# Patient Record
Sex: Female | Born: 1950 | Race: Black or African American | Hispanic: No | Smoking: Former smoker
Health system: Southern US, Community
[De-identification: ages and names within clinical notes are randomized; demographics above are authoritative.]

## PROBLEM LIST (undated history)

## (undated) DIAGNOSIS — C50912 Malignant neoplasm of unspecified site of left female breast: Secondary | ICD-10-CM

## (undated) DIAGNOSIS — E119 Type 2 diabetes mellitus without complications: Secondary | ICD-10-CM

## (undated) DIAGNOSIS — J189 Pneumonia, unspecified organism: Secondary | ICD-10-CM

## (undated) DIAGNOSIS — I10 Essential (primary) hypertension: Secondary | ICD-10-CM

## (undated) DIAGNOSIS — Z9221 Personal history of antineoplastic chemotherapy: Secondary | ICD-10-CM

## (undated) DIAGNOSIS — E039 Hypothyroidism, unspecified: Secondary | ICD-10-CM

## (undated) DIAGNOSIS — R51 Headache: Secondary | ICD-10-CM

## (undated) DIAGNOSIS — D649 Anemia, unspecified: Secondary | ICD-10-CM

## (undated) DIAGNOSIS — R519 Headache, unspecified: Secondary | ICD-10-CM

## (undated) DIAGNOSIS — G709 Myoneural disorder, unspecified: Secondary | ICD-10-CM

## (undated) DIAGNOSIS — M549 Dorsalgia, unspecified: Secondary | ICD-10-CM

## (undated) DIAGNOSIS — Z923 Personal history of irradiation: Secondary | ICD-10-CM

## (undated) DIAGNOSIS — E785 Hyperlipidemia, unspecified: Secondary | ICD-10-CM

## (undated) DIAGNOSIS — I639 Cerebral infarction, unspecified: Secondary | ICD-10-CM

## (undated) DIAGNOSIS — G8929 Other chronic pain: Secondary | ICD-10-CM

## (undated) DIAGNOSIS — R011 Cardiac murmur, unspecified: Secondary | ICD-10-CM

## (undated) DIAGNOSIS — M1990 Unspecified osteoarthritis, unspecified site: Secondary | ICD-10-CM

## (undated) HISTORY — PX: ABDOMINAL HYSTERECTOMY: SHX81

## (undated) HISTORY — DX: Headache: R51

## (undated) HISTORY — PX: TONSILLECTOMY: SUR1361

## (undated) HISTORY — PX: CARPAL TUNNEL RELEASE: SHX101

## (undated) HISTORY — PX: APPENDECTOMY: SHX54

## (undated) HISTORY — DX: Headache, unspecified: R51.9

## (undated) HISTORY — PX: BUNIONECTOMY: SHX129

## (undated) HISTORY — PX: BILATERAL OOPHORECTOMY: SHX1221

## (undated) HISTORY — PX: REDUCTION MAMMAPLASTY: SUR839

---

## 2006-05-24 HISTORY — PX: MASTECTOMY: SHX3

## 2006-05-24 HISTORY — PX: BREAST BIOPSY: SHX20

## 2011-02-16 HISTORY — PX: CARDIAC CATHETERIZATION: SHX172

## 2014-02-05 DIAGNOSIS — E119 Type 2 diabetes mellitus without complications: Secondary | ICD-10-CM | POA: Insufficient documentation

## 2014-02-05 DIAGNOSIS — E041 Nontoxic single thyroid nodule: Secondary | ICD-10-CM | POA: Insufficient documentation

## 2014-02-05 DIAGNOSIS — Z853 Personal history of malignant neoplasm of breast: Secondary | ICD-10-CM | POA: Insufficient documentation

## 2014-02-05 DIAGNOSIS — I10 Essential (primary) hypertension: Secondary | ICD-10-CM | POA: Insufficient documentation

## 2014-02-25 ENCOUNTER — Telehealth: Payer: Self-pay | Admitting: *Deleted

## 2014-02-25 NOTE — Telephone Encounter (Signed)
Received paperwork to [facility].  Called pt and confirmed 03/22/14 appt w/ her.  Mailed calendar, welcoming packet & intake form to pt.  Made copy of records and placed one in Dr. Geralyn Flash box and took the other one to [facility] to scan.

## 2014-03-22 ENCOUNTER — Ambulatory Visit (HOSPITAL_BASED_OUTPATIENT_CLINIC_OR_DEPARTMENT_OTHER): Payer: Medicare Other | Admitting: Hematology and Oncology

## 2014-03-22 ENCOUNTER — Other Ambulatory Visit (INDEPENDENT_AMBULATORY_CARE_PROVIDER_SITE_OTHER): Payer: Self-pay

## 2014-03-22 ENCOUNTER — Ambulatory Visit (HOSPITAL_BASED_OUTPATIENT_CLINIC_OR_DEPARTMENT_OTHER): Payer: Medicare Other

## 2014-03-22 ENCOUNTER — Ambulatory Visit: Payer: Medicare Other

## 2014-03-22 ENCOUNTER — Telehealth: Payer: Self-pay | Admitting: Hematology and Oncology

## 2014-03-22 ENCOUNTER — Ambulatory Visit: Payer: BC Managed Care – PPO

## 2014-03-22 VITALS — BP 147/72 | HR 71 | Temp 98.4°F | Resp 18 | Ht 63.0 in | Wt 181.4 lb

## 2014-03-22 DIAGNOSIS — D630 Anemia in neoplastic disease: Secondary | ICD-10-CM

## 2014-03-22 DIAGNOSIS — C50412 Malignant neoplasm of upper-outer quadrant of left female breast: Secondary | ICD-10-CM

## 2014-03-22 DIAGNOSIS — Z170 Estrogen receptor positive status [ER+]: Secondary | ICD-10-CM

## 2014-03-22 DIAGNOSIS — E042 Nontoxic multinodular goiter: Secondary | ICD-10-CM

## 2014-03-22 LAB — CBC & DIFF AND RETIC
BASO%: 0.2 % (ref 0.0–2.0)
BASOS ABS: 0 10*3/uL (ref 0.0–0.1)
EOS%: 1.6 % (ref 0.0–7.0)
Eosinophils Absolute: 0.1 10*3/uL (ref 0.0–0.5)
HCT: 36.9 % (ref 34.8–46.6)
HEMOGLOBIN: 12 g/dL (ref 11.6–15.9)
Immature Retic Fract: 7.2 % (ref 1.60–10.00)
LYMPH#: 1.9 10*3/uL (ref 0.9–3.3)
LYMPH%: 43.4 % (ref 14.0–49.7)
MCH: 26.5 pg (ref 25.1–34.0)
MCHC: 32.5 g/dL (ref 31.5–36.0)
MCV: 81.5 fL (ref 79.5–101.0)
MONO#: 0.3 10*3/uL (ref 0.1–0.9)
MONO%: 7.1 % (ref 0.0–14.0)
NEUT%: 47.7 % (ref 38.4–76.8)
NEUTROS ABS: 2.1 10*3/uL (ref 1.5–6.5)
NRBC: 0 % (ref 0–0)
Platelets: 186 10*3/uL (ref 145–400)
RBC: 4.53 10*6/uL (ref 3.70–5.45)
RDW: 16.1 % — AB (ref 11.2–14.5)
RETIC %: 1.21 % (ref 0.70–2.10)
RETIC CT ABS: 54.81 10*3/uL (ref 33.70–90.70)
WBC: 4.4 10*3/uL (ref 3.9–10.3)

## 2014-03-22 LAB — IRON AND TIBC CHCC
%SAT: 20 % — AB (ref 21–57)
IRON: 70 ug/dL (ref 41–142)
TIBC: 355 ug/dL (ref 236–444)
UIBC: 285 ug/dL (ref 120–384)

## 2014-03-22 LAB — COMPREHENSIVE METABOLIC PANEL (CC13)
ALT: 20 U/L (ref 0–55)
ANION GAP: 9 meq/L (ref 3–11)
AST: 18 U/L (ref 5–34)
Albumin: 4.1 g/dL (ref 3.5–5.0)
Alkaline Phosphatase: 58 U/L (ref 40–150)
BUN: 19.6 mg/dL (ref 7.0–26.0)
CALCIUM: 10.3 mg/dL (ref 8.4–10.4)
CO2: 29 meq/L (ref 22–29)
CREATININE: 1.1 mg/dL (ref 0.6–1.1)
Chloride: 105 mEq/L (ref 98–109)
Glucose: 89 mg/dl (ref 70–140)
POTASSIUM: 4 meq/L (ref 3.5–5.1)
Sodium: 143 mEq/L (ref 136–145)
Total Bilirubin: 1.1 mg/dL (ref 0.20–1.20)
Total Protein: 7 g/dL (ref 6.4–8.3)

## 2014-03-22 LAB — FERRITIN CHCC: Ferritin: 9 ng/ml (ref 9–269)

## 2014-03-22 NOTE — Assessment & Plan Note (Signed)
Normocytic anemia: We will obtain another CBC with reticulocyte count, erythropoietin, iron studies O17 and folic acid for further evaluation.

## 2014-03-22 NOTE — Telephone Encounter (Signed)
gv adn printed appt sched and avs for pt for July 2016...sent to lab

## 2014-03-22 NOTE — Assessment & Plan Note (Signed)
Left breast cancer status post left mastectomy December 2000 and T2, N1, M0 stage IIB ER/PR positive HER-2 negative followed by adjuvant chemotherapy and radiation started Arimidex July 2009 and completed today 03/22/2014  I discussed with her that there is no current data to support antiestrogen therapy with aromatase inhibitors for more than 5 years. If she wanted to continue I be happy to continue this for later on that there are risks to treatment. The patient was not interested in continuing and would like to stop it at this point. Surveillance: She will have a mammogram in June 2015 I will see her after that for physical exam and followup.  Back pain: I suspect this is musculoskeletal in nature, because it has been persistent for the past 2 years, ectopy and a bone scan.

## 2014-03-22 NOTE — Progress Notes (Signed)
[facility] CONSULT NOTE  Patient has no care team.  CHIEF COMPLAINTS/PURPOSE OF CONSULTATION:  To establish breast cancer care  HISTORY OF PRESENTING ILLNESS:  Jeanette [name] 63 y.o. female is here because of diagnosis of left breast cancer was diagnosed in 2008. She underwent left mastectomy and apparently showed a T2, N1, M0 stage IIB ER/PR positive HER-2 negative breast cancer. We do not have any records to confirm this this is based on the patient's information can she subsequently underwent chemotherapy with Taxotere and Cytoxan x4 cycles. After that she underwent adjuvant radiation therapy and she has not been on antiestrogen therapy with Arimidex since July 2009. She was being treated previously at an outside facility and oncology's recommendation was to continue antiestrogen therapy beyond 5 years she wishes to establish oncology care with [place]. Patient really wishes to stop Arimidex therapy.  I reviewed her records extensively and collaborated the history with the patient.  SUMMARY OF ONCOLOGIC HISTORY:   Breast cancer of upper-outer quadrant of left female breast   03/23/2007 Initial Diagnosis Breast cancer of upper-outer quadrant of left female breast   05/09/2007 Surgery Left mastectomy T2, N1, M0 stage IIB ER/PR positive HER-2 negative (based on patient's description.- no records available)   06/13/2007 - 08/02/2007 Chemotherapy Taxotere Cytoxan x4   09/21/2007 - 11/01/2007 Radiation Therapy Adjuvant radiation therapy   11/28/2007 - 03/22/2014 Anti-estrogen oral therapy Arimidex 1 mg daily   MEDICAL HISTORY:  Hypercholesterolemia, breast cancer, chronic back pain, degenerative disc disease SURGICAL HISTORY: Tonsillectomy, hysterectomy, bunionectomy, left mastectomy, oophorectomy, carpal tunnel surgery, breast reduction, appendectomy SOCIAL HISTORY: Divorced never smoker never drinker denies recreational drug use FAMILY HISTORY: No family history of breast cancer or any  other cancers ALLERGIES:  has No Known Allergies.  MEDICATIONS:  Current Outpatient Prescriptions  Medication Sig Dispense Refill  . anastrozole (ARIMIDEX) 1 MG tablet Take 1 mg by mouth.      Marland Kitchen aspirin 325 MG tablet Take by mouth.      Marland Kitchen atorvastatin (LIPITOR) 20 MG tablet Take by mouth.      . chlorpheniramine-HYDROcodone (TUSSIONEX) 10-8 MG/5ML LQCR Take by mouth.      . hydrochlorothiazide (HYDRODIURIL) 25 MG tablet Take by mouth.      . losartan (COZAAR) 100 MG tablet Take by mouth.      . metFORMIN (GLUCOPHAGE) 1000 MG tablet Take by mouth.      . metoprolol tartrate (LOPRESSOR) 25 MG tablet Take by mouth.      . Multiple Vitamins-Minerals (MULTIVITAMIN WITH MINERALS) tablet Take by mouth.      . naproxen (NAPROSYN) 500 MG tablet Take by mouth.       No current facility-administered medications for this visit.    REVIEW OF SYSTEMS:   Constitutional: Denies fevers, chills or abnormal night sweats Eyes: Denies blurriness of vision, double vision or watery eyes Ears, nose, mouth, throat, and face: Denies mucositis or sore throat Respiratory: Denies cough, dyspnea or wheezes Cardiovascular: Denies palpitation, chest discomfort or lower extremity swelling Gastrointestinal:  Denies nausea, heartburn or change in bowel habits Skin: Denies abnormal skin rashes Lymphatics: Denies new lymphadenopathy or easy bruising Neurological:Denies numbness, tingling or new weaknesses Behavioral/Psych: Mood is stable, no new changes  Breast:  Denies any palpable lumps or discharge All other systems were reviewed with the patient and are negative.  PHYSICAL EXAMINATION: ECOG PERFORMANCE STATUS: 0 - Asymptomatic  Filed Vitals:   03/22/14 1039  BP: 147/72  Pulse: 71  Temp: 98.4 F (36.9 C)  Resp: 18   Filed Weights   03/22/14 1039  Weight: 181 lb 6.4 oz (82.283 kg)    GENERAL:alert, no distress and comfortable SKIN: skin color, texture, turgor are normal, no rashes or significant  lesions EYES: normal, conjunctiva are pink and non-injected, sclera clear OROPHARYNX:no exudate, no erythema and lips, buccal mucosa, and tongue normal  NECK: supple, thyroid normal size, non-tender, without nodularity LYMPH:  no palpable lymphadenopathy in the cervical, axillary or inguinal LUNGS: clear to auscultation and percussion with normal breathing effort HEART: regular rate & rhythm and no murmurs and no lower extremity edema ABDOMEN:abdomen soft, non-tender and normal bowel sounds Musculoskeletal:no cyanosis of digits and no clubbing  PSYCH: alert & oriented x 3 with fluent speech NEURO: no focal motor/sensory deficits BREAST: No palpable nodules in breast. No palpable axillary or supraclavicular lymphadenopathy  LABORATORY DATA:  I have reviewed the data as listed Lab Results  Component Value Date   WBC 4.4 03/22/2014   HGB 12.0 03/22/2014   HCT 36.9 03/22/2014   MCV 81.5 03/22/2014   PLT 186 03/22/2014   Lab Results  Component Value Date   NA 143 03/22/2014   K 4.0 03/22/2014   CO2 29 03/22/2014    RADIOGRAPHIC STUDIES: I have personally reviewed the radiological reports and agreed with the findings in the report.  ASSESSMENT AND PLAN:  Breast cancer of upper-outer quadrant of left female breast Left breast cancer status post left mastectomy December 2000 and T2, N1, M0 stage IIB ER/PR positive HER-2 negative followed by adjuvant chemotherapy and radiation started Arimidex July 2009 and completed today 03/22/2014  I discussed with her that there is no current data to support antiestrogen therapy with aromatase inhibitors for more than 5 years. If she wanted to continue I be happy to continue this for later on that there are risks to treatment. The patient was not interested in continuing and would like to stop it at this point. Surveillance: She will have a mammogram in June 2015 I will see her after that for physical exam and followup.  Back pain: I suspect this  is musculoskeletal in nature, because it has been persistent for the past 2 years, I will obtain a bone scan and call her with the result.   Anemia in neoplastic disease Normocytic anemia: We will obtain another CBC with reticulocyte count, erythropoietin, iron studies G25 and folic acid for further evaluation. All questions were answered. The patient knows to call the clinic with any problems, questions or concerns. I spent 40 minutes counseling the patient face to face. The total time spent in the appointment was 60 minutes and more than 50% was on counseling.     Rulon Eisenmenger, MD 03/22/2014 4:05 PM

## 2014-03-25 LAB — ERYTHROPOIETIN: Erythropoietin: 16 m[IU]/mL (ref 2.6–18.5)

## 2014-03-25 LAB — VITAMIN B12: VITAMIN B 12: 964 pg/mL — AB (ref 211–911)

## 2014-03-25 LAB — FOLATE: Folate: >20 ng/mL

## 2014-03-26 DIAGNOSIS — E785 Hyperlipidemia, unspecified: Secondary | ICD-10-CM | POA: Insufficient documentation

## 2014-03-28 ENCOUNTER — Telehealth: Payer: Self-pay

## 2014-03-28 NOTE — Telephone Encounter (Signed)
Charting error.

## 2014-03-29 ENCOUNTER — Encounter (HOSPITAL_COMMUNITY)
Admission: RE | Admit: 2014-03-29 | Discharge: 2014-03-29 | Disposition: A | Discharge status: Home / Self Care | Payer: Medicare Other | Source: Ambulatory Visit | Attending: Hematology and Oncology | Admitting: Hematology and Oncology

## 2014-03-29 DIAGNOSIS — C50412 Malignant neoplasm of upper-outer quadrant of left female breast: Secondary | ICD-10-CM

## 2014-03-29 MED ORDER — TECHNETIUM TC 99M MEDRONATE IV KIT
24.5000 | PACK | Freq: Once | INTRAVENOUS | Status: AC | PRN
  Administered 2014-03-29: 24.5 via INTRAVENOUS

## 2014-04-02 ENCOUNTER — Encounter: Payer: Self-pay | Admitting: *Deleted

## 2014-04-02 ENCOUNTER — Other Ambulatory Visit: Payer: Self-pay | Admitting: Hematology and Oncology

## 2014-04-02 DIAGNOSIS — C50412 Malignant neoplasm of upper-outer quadrant of left female breast: Secondary | ICD-10-CM

## 2014-04-02 NOTE — Progress Notes (Signed)
Ordered Breast Cancer Index per Dr. Lindi Adie.  Faxed requisition to [facility].

## 2014-04-03 ENCOUNTER — Ambulatory Visit
Admission: RE | Admit: 2014-04-03 | Discharge: 2014-04-03 | Disposition: A | Discharge status: Home / Self Care | Payer: Medicare Other | Source: Ambulatory Visit | Attending: Surgery | Admitting: Surgery

## 2014-04-03 ENCOUNTER — Other Ambulatory Visit (HOSPITAL_COMMUNITY)
Admission: RE | Admit: 2014-04-03 | Discharge: 2014-04-03 | Disposition: A | Discharge status: Home / Self Care | Payer: Medicare Other | Source: Ambulatory Visit | Attending: Interventional Radiology | Admitting: Interventional Radiology

## 2014-04-03 DIAGNOSIS — E042 Nontoxic multinodular goiter: Secondary | ICD-10-CM

## 2014-04-03 DIAGNOSIS — E041 Nontoxic single thyroid nodule: Secondary | ICD-10-CM | POA: Insufficient documentation

## 2014-04-05 ENCOUNTER — Other Ambulatory Visit: Payer: Self-pay | Admitting: Hematology and Oncology

## 2014-04-05 ENCOUNTER — Ambulatory Visit (HOSPITAL_COMMUNITY)
Admission: RE | Admit: 2014-04-05 | Discharge: 2014-04-05 | Disposition: A | Discharge status: Home / Self Care | Payer: Medicare Other | Source: Ambulatory Visit | Attending: Hematology and Oncology | Admitting: Hematology and Oncology

## 2014-04-05 DIAGNOSIS — C50919 Malignant neoplasm of unspecified site of unspecified female breast: Secondary | ICD-10-CM | POA: Insufficient documentation

## 2014-04-05 DIAGNOSIS — R938 Abnormal findings on diagnostic imaging of other specified body structures: Secondary | ICD-10-CM | POA: Diagnosis not present

## 2014-04-05 DIAGNOSIS — C50412 Malignant neoplasm of upper-outer quadrant of left female breast: Secondary | ICD-10-CM

## 2014-04-09 ENCOUNTER — Telehealth: Payer: Self-pay

## 2014-04-09 ENCOUNTER — Ambulatory Visit (HOSPITAL_COMMUNITY)
Admission: RE | Admit: 2014-04-09 | Discharge: 2014-04-09 | Disposition: A | Discharge status: Home / Self Care | Payer: Medicare Other | Source: Ambulatory Visit | Attending: Hematology and Oncology | Admitting: Hematology and Oncology

## 2014-04-09 ENCOUNTER — Encounter (HOSPITAL_COMMUNITY): Payer: Self-pay

## 2014-04-09 DIAGNOSIS — Z9221 Personal history of antineoplastic chemotherapy: Secondary | ICD-10-CM | POA: Diagnosis not present

## 2014-04-09 DIAGNOSIS — J8410 Pulmonary fibrosis, unspecified: Secondary | ICD-10-CM | POA: Insufficient documentation

## 2014-04-09 DIAGNOSIS — Z853 Personal history of malignant neoplasm of breast: Secondary | ICD-10-CM | POA: Diagnosis present

## 2014-04-09 DIAGNOSIS — M2578 Osteophyte, vertebrae: Secondary | ICD-10-CM | POA: Diagnosis not present

## 2014-04-09 DIAGNOSIS — R911 Solitary pulmonary nodule: Secondary | ICD-10-CM | POA: Diagnosis present

## 2014-04-09 DIAGNOSIS — Z9012 Acquired absence of left breast and nipple: Secondary | ICD-10-CM | POA: Diagnosis not present

## 2014-04-09 DIAGNOSIS — Z923 Personal history of irradiation: Secondary | ICD-10-CM | POA: Diagnosis not present

## 2014-04-09 DIAGNOSIS — C50412 Malignant neoplasm of upper-outer quadrant of left female breast: Secondary | ICD-10-CM

## 2014-04-09 NOTE — Telephone Encounter (Signed)
Sample request rcvd from [facility].  Faxed to pathology.  Sent to scan.

## 2014-04-12 ENCOUNTER — Telehealth (INDEPENDENT_AMBULATORY_CARE_PROVIDER_SITE_OTHER): Payer: Self-pay

## 2014-04-12 DIAGNOSIS — E049 Nontoxic goiter, unspecified: Secondary | ICD-10-CM

## 2014-04-12 NOTE — Telephone Encounter (Signed)
Pt notified of path result in epic. Per Dr Gala Lewandowsky 03-22-14 ov note in allscripts pt advised will need follow up usn in 6 mon. Pt advised msg will be forwarded to Dr Harlow Asa to review. Order in epic for thyroid usn in 6 mo. Order to ref coord to set up.

## 2014-04-15 ENCOUNTER — Other Ambulatory Visit: Payer: Self-pay | Admitting: Hematology and Oncology

## 2014-04-15 DIAGNOSIS — Z1231 Encounter for screening mammogram for malignant neoplasm of breast: Secondary | ICD-10-CM

## 2014-04-15 DIAGNOSIS — Z853 Personal history of malignant neoplasm of breast: Secondary | ICD-10-CM

## 2014-04-15 DIAGNOSIS — C50412 Malignant neoplasm of upper-outer quadrant of left female breast: Secondary | ICD-10-CM

## 2014-05-03 ENCOUNTER — Encounter: Payer: Self-pay | Admitting: *Deleted

## 2014-05-03 NOTE — Progress Notes (Signed)
Called and spoke to [place] in pathology at [facility] in [place], [place] concerning block being sent to [facility] for BCI index.  She stated that there are no blocks to send.  Her block was sent to [facility] years ago for a study and was never returned.  They have been trying for 5 years to get these blocks returned and now it is in legal hands.  Will inform Dr. Lindi Adie.

## 2014-05-07 DIAGNOSIS — R5383 Other fatigue: Secondary | ICD-10-CM | POA: Insufficient documentation

## 2014-05-08 DIAGNOSIS — E1142 Type 2 diabetes mellitus with diabetic polyneuropathy: Secondary | ICD-10-CM | POA: Insufficient documentation

## 2014-05-10 ENCOUNTER — Telehealth: Payer: Self-pay

## 2014-05-10 NOTE — Telephone Encounter (Signed)
Msg rcvd from [place] at [facility] [phone].  Sample requested from them for [facility] Breast Cancer Index.  Per Tammy, was a 2008 sample and was not done there.

## 2014-06-07 ENCOUNTER — Telehealth: Payer: Self-pay

## 2014-06-07 NOTE — Telephone Encounter (Signed)
Per pt request, results of bone scan and CTs faxed to Dr. Precious Haws of [facility].  Sent to scan.   Let pt know records faxed to Dr Luciana Axe.  Pt voiced understanding.

## 2014-06-10 ENCOUNTER — Ambulatory Visit (INDEPENDENT_AMBULATORY_CARE_PROVIDER_SITE_OTHER): Payer: Self-pay | Admitting: Surgery

## 2014-06-17 ENCOUNTER — Encounter: Payer: Self-pay | Admitting: Hematology and Oncology

## 2014-07-06 NOTE — Pre-Procedure Instructions (Signed)
Tanesha Arambula  07/06/2014   Your procedure is scheduled on:  February 25  Report to [facility] Admitting at 07:15 AM.  Call this number if you have problems the morning of surgery: [phone]   Remember:   Do not eat food or drink liquids after midnight.   Take these medicines the morning of surgery with A SIP OF WATER: Gabapentin   STOP Aspirin, Multiple Vitamins, Naproxen February 18   STOP/ Do not take Aspirin, Aleve, Naproxen, Advil, Ibuprofen, Motrin, Vitamins, Herbs, or Supplements starting February 18   Do not wear jewelry, make-up or nail polish.  Do not wear lotions, powders, or perfumes. You may wear deodorant.  Do not shave 48 hours prior to surgery. Men may shave face and neck.  Do not bring valuables to the hospital.  [facility] is not responsible for any belongings or valuables.               Contacts, dentures or bridgework may not be worn into surgery.  Leave suitcase in the car. After surgery it may be brought to your room.  For patients admitted to the hospital, discharge time is determined by your treatment team.               Special Instructions: [place] - Preparing for Surgery  Before surgery, you can play an important role.  Because skin is not sterile, your skin needs to be as free of germs as possible.  You can reduce the number of germs on you skin by washing with CHG (chlorahexidine gluconate) soap before surgery.  CHG is an antiseptic cleaner which kills germs and bonds with the skin to continue killing germs even after washing.  Please DO NOT use if you have an allergy to CHG or antibacterial soaps.  If your skin becomes reddened/irritated stop using the CHG and inform your nurse when you arrive at Short Stay.  Do not shave (including legs and underarms) for at least 48 hours prior to the first CHG shower.  You may shave your face.  Please follow these instructions carefully:   1.  Shower with CHG Soap the night before surgery and  the morning of Surgery.  2.  If you choose to wash your hair, wash your hair first as usual with your normal shampoo.  3.  After you shampoo, rinse your hair and body thoroughly to remove the shampoo.  4.  Use CHG as you would any other liquid soap.  You can apply CHG directly to the skin and wash gently with scrungie or a clean washcloth.  5.  Apply the CHG Soap to your body ONLY FROM THE NECK DOWN.  Do not use on open wounds or open sores.  Avoid contact with your eyes, ears, mouth and genitals (private parts).  Wash genitals (private parts) with your normal soap.  6.  Wash thoroughly, paying special attention to the area where your surgery will be performed.  7.  Thoroughly rinse your body with warm water from the neck down.  8.  DO NOT shower/wash with your normal soap after using and rinsing off the CHG Soap.  9.  Pat yourself dry with a clean towel.            10.  Wear clean pajamas.            11.  Place clean sheets on your bed the night of your first shower and do not sleep with pets.  Day of Surgery  Do not apply any lotions the morning of surgery.  Please wear clean clothes to the hospital/surgery center.     Please read over the following fact sheets that you were given: Pain Booklet, Coughing and Deep Breathing and Surgical Site Infection Prevention

## 2014-07-08 ENCOUNTER — Inpatient Hospital Stay (HOSPITAL_COMMUNITY)
Admission: RE | Admit: 2014-07-08 | Discharge: 2014-07-08 | Disposition: A | Discharge status: Home / Self Care | Payer: Medicare Other | Source: Ambulatory Visit

## 2014-07-12 ENCOUNTER — Encounter (HOSPITAL_COMMUNITY)
Admission: RE | Admit: 2014-07-12 | Discharge: 2014-07-12 | Disposition: A | Discharge status: Home / Self Care | Payer: Medicare Other | Source: Ambulatory Visit | Attending: Surgery | Admitting: Surgery

## 2014-07-12 ENCOUNTER — Encounter (HOSPITAL_COMMUNITY): Payer: Self-pay

## 2014-07-12 DIAGNOSIS — D497 Neoplasm of unspecified behavior of endocrine glands and other parts of nervous system: Secondary | ICD-10-CM | POA: Insufficient documentation

## 2014-07-12 DIAGNOSIS — E042 Nontoxic multinodular goiter: Secondary | ICD-10-CM | POA: Insufficient documentation

## 2014-07-12 DIAGNOSIS — Z01818 Encounter for other preprocedural examination: Secondary | ICD-10-CM | POA: Insufficient documentation

## 2014-07-12 HISTORY — DX: Cardiac murmur, unspecified: R01.1

## 2014-07-12 HISTORY — DX: Unspecified osteoarthritis, unspecified site: M19.90

## 2014-07-12 HISTORY — DX: Essential (primary) hypertension: I10

## 2014-07-12 HISTORY — DX: Myoneural disorder, unspecified: G70.9

## 2014-07-12 HISTORY — DX: Hyperlipidemia, unspecified: E78.5

## 2014-07-12 HISTORY — DX: Anemia, unspecified: D64.9

## 2014-07-12 LAB — CBC
HCT: 36.8 % (ref 36.0–46.0)
Hemoglobin: 11.7 g/dL — ABNORMAL LOW (ref 12.0–15.0)
MCH: 26.3 pg (ref 26.0–34.0)
MCHC: 31.8 g/dL (ref 30.0–36.0)
MCV: 82.7 fL (ref 78.0–100.0)
PLATELETS: UNDETERMINED 10*3/uL (ref 150–400)
RBC: 4.45 MIL/uL (ref 3.87–5.11)
RDW: 16.1 % — ABNORMAL HIGH (ref 11.5–15.5)
WBC: 3.7 10*3/uL — ABNORMAL LOW (ref 4.0–10.5)

## 2014-07-12 LAB — BASIC METABOLIC PANEL
Anion gap: 7 (ref 5–15)
BUN: 18 mg/dL (ref 6–23)
CHLORIDE: 105 mmol/L (ref 96–112)
CO2: 28 mmol/L (ref 19–32)
CREATININE: 1.19 mg/dL — AB (ref 0.50–1.10)
Calcium: 9.8 mg/dL (ref 8.4–10.5)
GFR calc Af Amer: 56 mL/min — ABNORMAL LOW (ref >90)
GFR calc non Af Amer: 48 mL/min — ABNORMAL LOW (ref >90)
GLUCOSE: 94 mg/dL (ref 70–99)
POTASSIUM: 4.1 mmol/L (ref 3.5–5.1)
Sodium: 140 mmol/L (ref 135–145)

## 2014-07-12 NOTE — Progress Notes (Signed)
Quick Note:  These results are acceptable for scheduled surgery.  Ovila Lepage M. Shelba Susi, MD, FACS [facility] Office: [phone]   ______ 

## 2014-07-12 NOTE — Progress Notes (Signed)
Quick Note:  EKG is acceptable for scheduled surgery.  Hamda Klutts M. Aubreyana Saltz, MD, FACS [facility] Office: [phone]   ______ 

## 2014-07-12 NOTE — Progress Notes (Signed)
Quick Note:  EKG is acceptable for scheduled surgery.  Jewell Ryans M. America Sandall, MD, FACS [facility] Office: [phone]   ______ 

## 2014-07-15 ENCOUNTER — Encounter (HOSPITAL_COMMUNITY): Payer: Self-pay

## 2014-07-15 NOTE — Progress Notes (Signed)
Anesthesia Chart Review:  Patient is a 64 year old female scheduled for total thyroidectomy on 07/18/14 by Dr. Harlow Asa. DX: Multiple thyroid nodules, thyroid neoplasm of uncertain behavior.  History includes multinodular goiter with dominant 2.2 cm inferior left thyroid nodule (FNA done 04/03/14, but I don't see report findings), breast cancer s/p left mastectomy, HTN, HLD, murmur (told remotely), DM2, neuropathy, anemia, hysterectomy, tonsillectomy. PCP is Dr. Precious Haws ([facility]; see Care Everywhere). By notes, Dr. Luciana Axe is aware of upcoming surgery.  Meds include ASA, Lipitor, Neurontin, HCTZ, Cozaar, metformin, MVI, Actos.   07/12/14 EKG: SR with first degree AVB, cannot rule out anterior infarct (age undetermined), non-specific ST/T wave changes. There is no comparison tracing in Epic or Muse.  She does have an EKG interpretation from 02/16/11 done at [facility] that showed NSR, minimal voltage criteria for LVH, may be normal variant. PR 196 ms. I'll attempt to get the actual tracing.  She had a cardiac cath (Dr. Corliss Parish at [facility]) on 02/16/11 due to chest pain with results showing: IMPRESSION: 1. Normal coronary arteries. 2. Normal left ventricular systolic function. EF 65%. No evidence of AS or MR. 3. Noncardiac chest pain. 4. Diabetes mellitus. RECOMMENDATIONS: 1. No indications for percutaneous or surgical revascularization. 2. The patient's chest pain is likely noncardiac in origin. (Report can be viewed in Care Everywhere.)   04/05/14 CXR:  1. Cannot confirm or exclude metastatic disease in the region of bone scan abnormality. 2. 15 mm nodular opacity over the left chest could be within bone, lung, or the chest wall (if breast reconstruction). Chest CT recommended and done on 04/09/14 and showed:  IMPRESSION: - Left upper lobe and left hilar calcified granulomatous disease correlates with the chest radiograph finding. - No evidence of metastatic disease. - Previous left mastectomy and  axillary lymph node dissection.  Preoperative labs noted. Glucose 94. PLT clumps, so will repeat on the day of surgery (last PLT count was 154 on 05/07/14). TSH normal on 05/07/14 (Care Everywhere).  Comparison EKG is still pending, but based on currently available records including normal coronaries by cath in 2012, I would anticipate that she could proceed as planned if no acute changes.  No CV symptoms were documented from her PAT RN interview.  George [facility] Short Stay Center/Anesthesiology Phone [phone] 07/15/2014 1:08 PM

## 2014-07-17 MED ORDER — CEFAZOLIN SODIUM-DEXTROSE 2-3 GM-% IV SOLR
2.0000 g | INTRAVENOUS | Status: AC
  Administered 2014-07-18: 2 g via INTRAVENOUS
  Filled 2014-07-17: qty 50

## 2014-07-18 ENCOUNTER — Encounter (HOSPITAL_COMMUNITY)
Admission: RE | Disposition: A | Discharge status: Home / Self Care | Payer: Self-pay | Source: Ambulatory Visit | Attending: Surgery

## 2014-07-18 ENCOUNTER — Ambulatory Visit (HOSPITAL_COMMUNITY): Payer: Medicare Other | Admitting: Vascular Surgery

## 2014-07-18 ENCOUNTER — Ambulatory Visit (HOSPITAL_COMMUNITY): Payer: Medicare Other | Admitting: Anesthesiology

## 2014-07-18 ENCOUNTER — Observation Stay (HOSPITAL_COMMUNITY)
Admission: RE | Admit: 2014-07-18 | Discharge: 2014-07-19 | Disposition: A | Discharge status: Home / Self Care | Payer: Medicare Other | Source: Ambulatory Visit | Attending: Surgery | Admitting: Surgery

## 2014-07-18 ENCOUNTER — Encounter (HOSPITAL_COMMUNITY): Payer: Self-pay | Admitting: *Deleted

## 2014-07-18 DIAGNOSIS — Z7982 Long term (current) use of aspirin: Secondary | ICD-10-CM | POA: Diagnosis not present

## 2014-07-18 DIAGNOSIS — D440 Neoplasm of uncertain behavior of thyroid gland: Principal | ICD-10-CM | POA: Insufficient documentation

## 2014-07-18 DIAGNOSIS — Z87891 Personal history of nicotine dependence: Secondary | ICD-10-CM | POA: Insufficient documentation

## 2014-07-18 DIAGNOSIS — I10 Essential (primary) hypertension: Secondary | ICD-10-CM | POA: Insufficient documentation

## 2014-07-18 DIAGNOSIS — E042 Nontoxic multinodular goiter: Secondary | ICD-10-CM | POA: Insufficient documentation

## 2014-07-18 DIAGNOSIS — E119 Type 2 diabetes mellitus without complications: Secondary | ICD-10-CM | POA: Diagnosis not present

## 2014-07-18 DIAGNOSIS — E041 Nontoxic single thyroid nodule: Secondary | ICD-10-CM | POA: Diagnosis present

## 2014-07-18 DIAGNOSIS — E785 Hyperlipidemia, unspecified: Secondary | ICD-10-CM | POA: Diagnosis not present

## 2014-07-18 DIAGNOSIS — Z853 Personal history of malignant neoplasm of breast: Secondary | ICD-10-CM | POA: Insufficient documentation

## 2014-07-18 HISTORY — DX: Type 2 diabetes mellitus without complications: E11.9

## 2014-07-18 HISTORY — DX: Hypothyroidism, unspecified: E03.9

## 2014-07-18 HISTORY — DX: Other chronic pain: G89.29

## 2014-07-18 HISTORY — DX: Pneumonia, unspecified organism: J18.9

## 2014-07-18 HISTORY — PX: TOTAL THYROIDECTOMY: SHX2547

## 2014-07-18 HISTORY — PX: THYROIDECTOMY: SHX17

## 2014-07-18 HISTORY — DX: Dorsalgia, unspecified: M54.9

## 2014-07-18 HISTORY — DX: Malignant neoplasm of unspecified site of left female breast: C50.912

## 2014-07-18 LAB — GLUCOSE, CAPILLARY
GLUCOSE-CAPILLARY: 171 mg/dL — AB (ref 70–99)
Glucose-Capillary: 96 mg/dL (ref 70–99)
Glucose-Capillary: 124 mg/dL — ABNORMAL HIGH (ref 70–99)
Glucose-Capillary: 150 mg/dL — ABNORMAL HIGH (ref 70–99)

## 2014-07-18 LAB — PLATELET COUNT: PLATELETS: UNDETERMINED 10*3/uL (ref 150–400)

## 2014-07-18 SURGERY — THYROIDECTOMY
Anesthesia: General | Site: Neck

## 2014-07-18 MED ORDER — LIDOCAINE HCL (CARDIAC) 20 MG/ML IV SOLN
INTRAVENOUS | Status: DC | PRN
  Administered 2014-07-18: 50 mg via INTRAVENOUS

## 2014-07-18 MED ORDER — ONDANSETRON HCL 4 MG/2ML IJ SOLN
4.0000 mg | Freq: Four times a day (QID) | INTRAMUSCULAR | Status: DC | PRN
  Administered 2014-07-18 (×2): 4 mg via INTRAVENOUS
  Filled 2014-07-18 (×2): qty 2

## 2014-07-18 MED ORDER — CALCIUM CARBONATE 1250 (500 CA) MG PO TABS
2.0000 | ORAL_TABLET | Freq: Three times a day (TID) | ORAL | Status: DC
  Administered 2014-07-18 – 2014-07-19 (×3): 1000 mg via ORAL
  Filled 2014-07-18 (×3): qty 2

## 2014-07-18 MED ORDER — HYDROMORPHONE HCL 1 MG/ML IJ SOLN
INTRAMUSCULAR | Status: AC
  Filled 2014-07-18: qty 1

## 2014-07-18 MED ORDER — ONDANSETRON HCL 4 MG/2ML IJ SOLN
INTRAMUSCULAR | Status: DC | PRN
  Administered 2014-07-18: 4 mg via INTRAVENOUS

## 2014-07-18 MED ORDER — INSULIN ASPART 100 UNIT/ML ~~LOC~~ SOLN
0.0000 [IU] | Freq: Three times a day (TID) | SUBCUTANEOUS | Status: DC
  Administered 2014-07-18: 3 [IU] via SUBCUTANEOUS

## 2014-07-18 MED ORDER — MEPERIDINE HCL 25 MG/ML IJ SOLN: 6.2500 mg | INTRAMUSCULAR | Status: DC | PRN

## 2014-07-18 MED ORDER — KCL IN DEXTROSE-NACL 20-5-0.45 MEQ/L-%-% IV SOLN
INTRAVENOUS | Status: DC
  Administered 2014-07-18 – 2014-07-19 (×2): via INTRAVENOUS
  Filled 2014-07-18 (×2): qty 1000

## 2014-07-18 MED ORDER — GLYCOPYRROLATE 0.2 MG/ML IJ SOLN
INTRAMUSCULAR | Status: DC | PRN
  Administered 2014-07-18: 0.4 mg via INTRAVENOUS

## 2014-07-18 MED ORDER — ONDANSETRON HCL 4 MG PO TABS: 4.0000 mg | ORAL_TABLET | Freq: Four times a day (QID) | ORAL | Status: DC | PRN

## 2014-07-18 MED ORDER — MIDAZOLAM HCL 5 MG/5ML IJ SOLN
INTRAMUSCULAR | Status: DC | PRN
  Administered 2014-07-18: 2 mg via INTRAVENOUS

## 2014-07-18 MED ORDER — ACETAMINOPHEN 325 MG PO TABS: 650.0000 mg | ORAL_TABLET | ORAL | Status: DC | PRN

## 2014-07-18 MED ORDER — HYDROCODONE-ACETAMINOPHEN 5-325 MG PO TABS
1.0000 | ORAL_TABLET | ORAL | Status: DC | PRN
  Administered 2014-07-19: 2 via ORAL
  Filled 2014-07-18: qty 2

## 2014-07-18 MED ORDER — DEXAMETHASONE SODIUM PHOSPHATE 4 MG/ML IJ SOLN
INTRAMUSCULAR | Status: DC | PRN
  Administered 2014-07-18: 4 mg via INTRAVENOUS

## 2014-07-18 MED ORDER — LOSARTAN POTASSIUM 50 MG PO TABS
100.0000 mg | ORAL_TABLET | Freq: Every day | ORAL | Status: DC
  Administered 2014-07-19: 100 mg via ORAL
  Filled 2014-07-18: qty 2

## 2014-07-18 MED ORDER — ROCURONIUM BROMIDE 100 MG/10ML IV SOLN
INTRAVENOUS | Status: DC | PRN
  Administered 2014-07-18: 10 mg via INTRAVENOUS
  Administered 2014-07-18: 40 mg via INTRAVENOUS

## 2014-07-18 MED ORDER — FENTANYL CITRATE 0.05 MG/ML IJ SOLN
INTRAMUSCULAR | Status: DC | PRN
  Administered 2014-07-18: 100 ug via INTRAVENOUS

## 2014-07-18 MED ORDER — FENTANYL CITRATE 0.05 MG/ML IJ SOLN
INTRAMUSCULAR | Status: AC
  Filled 2014-07-18: qty 5

## 2014-07-18 MED ORDER — HEMOSTATIC AGENTS (NO CHARGE) OPTIME
TOPICAL | Status: DC | PRN
  Administered 2014-07-18: 1 via TOPICAL

## 2014-07-18 MED ORDER — PIOGLITAZONE HCL 45 MG PO TABS
45.0000 mg | ORAL_TABLET | Freq: Every day | ORAL | Status: DC
  Administered 2014-07-19: 45 mg via ORAL
  Filled 2014-07-18: qty 1

## 2014-07-18 MED ORDER — PROPOFOL 10 MG/ML IV BOLUS
INTRAVENOUS | Status: AC
  Filled 2014-07-18: qty 20

## 2014-07-18 MED ORDER — MIDAZOLAM HCL 2 MG/2ML IJ SOLN
INTRAMUSCULAR | Status: AC
  Filled 2014-07-18: qty 2

## 2014-07-18 MED ORDER — ROCURONIUM BROMIDE 50 MG/5ML IV SOLN
INTRAVENOUS | Status: AC
  Filled 2014-07-18: qty 1

## 2014-07-18 MED ORDER — 0.9 % SODIUM CHLORIDE (POUR BTL) OPTIME
TOPICAL | Status: DC | PRN
  Administered 2014-07-18: 1000 mL

## 2014-07-18 MED ORDER — LIDOCAINE HCL (CARDIAC) 20 MG/ML IV SOLN
INTRAVENOUS | Status: AC
  Filled 2014-07-18: qty 5

## 2014-07-18 MED ORDER — PHENYLEPHRINE HCL 10 MG/ML IJ SOLN
INTRAMUSCULAR | Status: DC | PRN
  Administered 2014-07-18: 80 ug via INTRAVENOUS
  Administered 2014-07-18: 40 ug via INTRAVENOUS

## 2014-07-18 MED ORDER — HYDROMORPHONE HCL 1 MG/ML IJ SOLN
0.2500 mg | INTRAMUSCULAR | Status: DC | PRN
  Administered 2014-07-18: 0.5 mg via INTRAVENOUS

## 2014-07-18 MED ORDER — NEOSTIGMINE METHYLSULFATE 10 MG/10ML IV SOLN
INTRAVENOUS | Status: DC | PRN
  Administered 2014-07-18: 3 mg via INTRAVENOUS

## 2014-07-18 MED ORDER — PROPOFOL 10 MG/ML IV BOLUS
INTRAVENOUS | Status: DC | PRN
  Administered 2014-07-18: 150 mg via INTRAVENOUS

## 2014-07-18 MED ORDER — HYDROMORPHONE HCL 1 MG/ML IJ SOLN
1.0000 mg | INTRAMUSCULAR | Status: DC | PRN
Start: 2014-07-18 — End: 2014-07-19
  Administered 2014-07-18: 1 mg via INTRAVENOUS
  Filled 2014-07-18: qty 1

## 2014-07-18 MED ORDER — LACTATED RINGERS IV SOLN
INTRAVENOUS | Status: DC
  Administered 2014-07-18: 11:00:00 via INTRAVENOUS
  Administered 2014-07-18: 50 mL/h via INTRAVENOUS

## 2014-07-18 MED ORDER — GABAPENTIN 300 MG PO CAPS
300.0000 mg | ORAL_CAPSULE | Freq: Three times a day (TID) | ORAL | Status: DC
  Administered 2014-07-18 – 2014-07-19 (×3): 300 mg via ORAL
  Filled 2014-07-18 (×3): qty 1

## 2014-07-18 MED ORDER — DEXAMETHASONE SODIUM PHOSPHATE 4 MG/ML IJ SOLN
INTRAMUSCULAR | Status: AC
  Filled 2014-07-18: qty 1

## 2014-07-18 MED ORDER — METFORMIN HCL 500 MG PO TABS
1000.0000 mg | ORAL_TABLET | Freq: Two times a day (BID) | ORAL | Status: DC
  Administered 2014-07-18 – 2014-07-19 (×2): 1000 mg via ORAL
  Filled 2014-07-18 (×2): qty 2

## 2014-07-18 MED ORDER — HYDROCHLOROTHIAZIDE 25 MG PO TABS
12.5000 mg | ORAL_TABLET | Freq: Every day | ORAL | Status: DC
  Administered 2014-07-19: 12.5 mg via ORAL
  Filled 2014-07-18: qty 1

## 2014-07-18 MED ORDER — PROMETHAZINE HCL 25 MG/ML IJ SOLN: 6.2500 mg | INTRAMUSCULAR | Status: DC | PRN

## 2014-07-18 SURGICAL SUPPLY — 59 items
ATTRACTOMAT 16X20 MAGNETIC DRP (DRAPES) ×3 IMPLANT
BENZOIN TINCTURE PRP APPL 2/3 (GAUZE/BANDAGES/DRESSINGS) IMPLANT
BLADE SURG 10 STRL SS (BLADE) ×3 IMPLANT
BLADE SURG 15 STRL LF DISP TIS (BLADE) ×1 IMPLANT
BLADE SURG 15 STRL SS (BLADE) ×2
BLADE SURG ROTATE 9660 (MISCELLANEOUS) IMPLANT
CANISTER SUCTION 2500CC (MISCELLANEOUS) ×3 IMPLANT
CHLORAPREP W/TINT 10.5 ML (MISCELLANEOUS) IMPLANT
CHLORAPREP W/TINT 26ML (MISCELLANEOUS) ×3 IMPLANT
CLIP TI MEDIUM 24 (CLIP) ×3 IMPLANT
CLIP TI WIDE RED SMALL 24 (CLIP) ×3 IMPLANT
CLOSURE WOUND 1/2 X4 (GAUZE/BANDAGES/DRESSINGS)
CONT SPEC 4OZ CLIKSEAL STRL BL (MISCELLANEOUS) IMPLANT
COVER SURGICAL LIGHT HANDLE (MISCELLANEOUS) ×3 IMPLANT
CRADLE DONUT ADULT HEAD (MISCELLANEOUS) ×3 IMPLANT
DRAPE PED LAPAROTOMY (DRAPES) ×3 IMPLANT
DRAPE UTILITY XL STRL (DRAPES) ×3 IMPLANT
ELECT CAUTERY BLADE 6.4 (BLADE) ×3 IMPLANT
ELECT REM PT RETURN 9FT ADLT (ELECTROSURGICAL) ×3
ELECTRODE REM PT RTRN 9FT ADLT (ELECTROSURGICAL) ×1 IMPLANT
GAUZE SPONGE 4X4 12PLY STRL (GAUZE/BANDAGES/DRESSINGS) IMPLANT
GAUZE SPONGE 4X4 16PLY XRAY LF (GAUZE/BANDAGES/DRESSINGS) ×3 IMPLANT
GLOVE BIO SURGEON STRL SZ7 (GLOVE) ×3 IMPLANT
GLOVE BIOGEL PI IND STRL 7.0 (GLOVE) ×1 IMPLANT
GLOVE BIOGEL PI IND STRL 7.5 (GLOVE) ×1 IMPLANT
GLOVE BIOGEL PI IND STRL 8 (GLOVE) ×1 IMPLANT
GLOVE BIOGEL PI INDICATOR 7.0 (GLOVE) ×2
GLOVE BIOGEL PI INDICATOR 7.5 (GLOVE) ×2
GLOVE BIOGEL PI INDICATOR 8 (GLOVE) ×2
GLOVE ECLIPSE 7.0 STRL STRAW (GLOVE) ×3 IMPLANT
GLOVE SS N UNI LF 7.0 STRL (GLOVE) ×3 IMPLANT
GLOVE SURG ORTHO 8.0 STRL STRW (GLOVE) ×3 IMPLANT
GLOVE SURG SS PI 8.0 STRL IVOR (GLOVE) ×3 IMPLANT
GOWN STRL REUS W/ TWL LRG LVL3 (GOWN DISPOSABLE) ×2 IMPLANT
GOWN STRL REUS W/ TWL XL LVL3 (GOWN DISPOSABLE) ×2 IMPLANT
GOWN STRL REUS W/TWL LRG LVL3 (GOWN DISPOSABLE) ×4
GOWN STRL REUS W/TWL XL LVL3 (GOWN DISPOSABLE) ×4
HEMOSTAT SURGICEL 2X4 FIBR (HEMOSTASIS) ×3 IMPLANT
KIT BASIN OR (CUSTOM PROCEDURE TRAY) ×3 IMPLANT
KIT ROOM TURNOVER OR (KITS) ×3 IMPLANT
LIQUID BAND (GAUZE/BANDAGES/DRESSINGS) ×3 IMPLANT
NS IRRIG 1000ML POUR BTL (IV SOLUTION) ×3 IMPLANT
PACK SURGICAL SETUP 50X90 (CUSTOM PROCEDURE TRAY) ×3 IMPLANT
PAD ARMBOARD 7.5X6 YLW CONV (MISCELLANEOUS) ×3 IMPLANT
PENCIL BUTTON HOLSTER BLD 10FT (ELECTRODE) ×3 IMPLANT
SHEARS HARMONIC 9CM CVD (BLADE) ×3 IMPLANT
SPECIMEN JAR MEDIUM (MISCELLANEOUS) IMPLANT
SPECIMEN JAR SMALL (MISCELLANEOUS) ×3 IMPLANT
SPONGE INTESTINAL PEANUT (DISPOSABLE) ×3 IMPLANT
STRIP CLOSURE SKIN 1/2X4 (GAUZE/BANDAGES/DRESSINGS) IMPLANT
SUT MNCRL AB 4-0 PS2 18 (SUTURE) ×3 IMPLANT
SUT SILK 2 0 (SUTURE) ×2
SUT SILK 2-0 18XBRD TIE 12 (SUTURE) ×1 IMPLANT
SUT VIC AB 3-0 SH 18 (SUTURE) ×6 IMPLANT
SYR BULB 3OZ (MISCELLANEOUS) ×3 IMPLANT
TOWEL OR 17X24 6PK STRL BLUE (TOWEL DISPOSABLE) IMPLANT
TOWEL OR 17X26 10 PK STRL BLUE (TOWEL DISPOSABLE) ×3 IMPLANT
TUBE CONNECTING 12'X1/4 (SUCTIONS) ×1
TUBE CONNECTING 12X1/4 (SUCTIONS) ×2 IMPLANT

## 2014-07-18 NOTE — Anesthesia Postprocedure Evaluation (Signed)
Anesthesia Post Note  Patient: Jeanette [name]  Procedure(s) Performed: Procedure(s) (LRB): TOTAL THYROIDECTOMY (N/A)  Anesthesia type: General  Patient location: PACU  Post pain: Pain level controlled  Post assessment: Post-op Vital signs reviewed  Last Vitals: BP 112/56 mmHg  Pulse 66  Temp(Src) 36.4 C (Oral)  Resp 13  Wt 179 lb (81.194 kg)  SpO2 100%  Post vital signs: Reviewed  Level of consciousness: sedated  Complications: No apparent anesthesia complications

## 2014-07-18 NOTE — Anesthesia Procedure Notes (Signed)
Procedure Name: Intubation Date/Time: 07/18/2014 9:13 AM Performed by: Melina Copa, Siera Beyersdorf R Pre-anesthesia Checklist: Patient identified, Emergency Drugs available, Suction available, Patient being monitored and Timeout performed Patient Re-evaluated:Patient Re-evaluated prior to inductionOxygen Delivery Method: Circle system utilized Preoxygenation: Pre-oxygenation with 100% oxygen Intubation Type: IV induction Ventilation: Mask ventilation without difficulty Laryngoscope Size: Mac and 3 Grade View: Grade I Tube type: Oral Tube size: 7.5 mm Number of attempts: 1 Airway Equipment and Method: Stylet Placement Confirmation: ETT inserted through vocal cords under direct vision,  positive ETCO2 and breath sounds checked- equal and bilateral Secured at: 21 cm Tube secured with: Tape Dental Injury: Teeth and Oropharynx as per pre-operative assessment

## 2014-07-18 NOTE — Anesthesia Preprocedure Evaluation (Signed)
Anesthesia Evaluation  Patient identified by MRN, date of birth, ID band Patient awake    Reviewed: Allergy & Precautions, NPO status , Patient's Chart, lab work & pertinent test results  Airway Mallampati: II  TM Distance: >3 FB Neck ROM: Full    Dental no notable dental hx.    Pulmonary neg pulmonary ROS, former smoker,  breath sounds clear to auscultation  Pulmonary exam normal       Cardiovascular hypertension, Pt. on medications + Valvular Problems/Murmurs Rhythm:Regular Rate:Normal     Neuro/Psych  Neuromuscular disease negative psych ROS   GI/Hepatic negative GI ROS, Neg liver ROS,   Endo/Other  negative endocrine ROSdiabetes, Type 2, Oral Hypoglycemic Agents  Renal/GU negative Renal ROS     Musculoskeletal  (+) Arthritis -,   Abdominal   Peds  Hematology  (+) anemia ,   Anesthesia Other Findings   Reproductive/Obstetrics negative OB ROS                             Anesthesia Physical Anesthesia Plan  ASA: II  Anesthesia Plan: General   Post-op Pain Management:    Induction: Intravenous  Airway Management Planned: Oral ETT  Additional Equipment: None  Intra-op Plan:   Post-operative Plan: Extubation in OR  Informed Consent: I have reviewed the patients History and Physical, chart, labs and discussed the procedure including the risks, benefits and alternatives for the proposed anesthesia with the patient or authorized representative who has indicated his/her understanding and acceptance.   Dental advisory given  Plan Discussed with: CRNA  Anesthesia Plan Comments:         Anesthesia Quick Evaluation

## 2014-07-18 NOTE — Brief Op Note (Signed)
07/18/2014  10:44 AM  PATIENT:  Jeanette [name]  64 y.o. female  PRE-OPERATIVE DIAGNOSIS:  MULTIPLE THYROID NODULES, THYROID NEOPLASM OF UNCERTAIN BEHAVIOR  POST-OPERATIVE DIAGNOSIS:  MULTIPLE THYROID NODULES, THYROID NEOPLASM OF UNCERTAIN BEHAVIOR  PROCEDURE:  Procedure(s): TOTAL THYROIDECTOMY (N/A)  SURGEON:  Surgeon(s) and Role:    * Armandina Gemma, MD - Primary  ASSISTANT: Laure Kidney, RNFA  ANESTHESIA:   general  EBL:  Total I/O In: 1000 [I.V.:1000] Out: -   BLOOD ADMINISTERED:none  DRAINS: none   LOCAL MEDICATIONS USED:  NONE  SPECIMEN:  Excision  DISPOSITION OF SPECIMEN:  PATHOLOGY  COUNTS:  YES  TOURNIQUET:  * No tourniquets in log *  DICTATION: .Other Dictation: Dictation Number [id number]  PLAN OF CARE: Admit for overnight observation  PATIENT DISPOSITION:  PACU - hemodynamically stable.   Delay start of Pharmacological VTE agent (>24hrs) due to surgical blood loss or risk of bleeding: yes  Earnstine Regal, MD, [facility] Office: [phone]

## 2014-07-18 NOTE — Progress Notes (Signed)
Patient brought to floor from PACU. Family at bedside.

## 2014-07-18 NOTE — Transfer of Care (Signed)
Immediate Anesthesia Transfer of Care Note  Patient: Jeanette [name]  Procedure(s) Performed: Procedure(s): TOTAL THYROIDECTOMY (N/A)  Patient Location: PACU  Anesthesia Type:General  Level of Consciousness: awake, alert  and oriented  Airway & Oxygen Therapy: Patient Spontanous Breathing and Patient connected to nasal cannula oxygen  Post-op Assessment: Report given to RN, Post -op Vital signs reviewed and stable and Patient moving all extremities  Post vital signs: Reviewed and stable  Last Vitals:  Filed Vitals:   07/18/14 0759  BP: 139/56  Pulse: 66  Temp: 36.7 C  Resp: 20    Complications: No apparent anesthesia complications

## 2014-07-18 NOTE — H&P (Signed)
  [facility]  Jeanette [name] DOB: August 06, 1950 Divorced / Language: English / Race: Black or African American Female  History of Present Illness Patient words: f/u thyroid.  The patient is a 64 year old female who presents with a thyroid nodule. Patient returns on referral from her primary care physician, Dr. Precious Haws, for further discussion of bilateral thyroid nodules. Patient has 4 subcentimeter nodules in the right thyroid lobe. She has 2 nodules in the left thyroid lobe, the largest measuring 2.2 cm. In November 2015 she underwent fine-needle aspiration biopsy with benign cytopathology. Observation was recommended with a repeat ultrasound in 6 months. Patient has a personal history of breast cancer. She is uncomfortable with further observation. She is able to palpate the nodule in the left thyroid lobe easily. She prefers to proceed with total thyroidectomy for definitive diagnosis and management. She presents today to discuss this surgical procedure.   Allergies No Known Drug Allergies10/30/2015  Medication History  Aspirin EC (Oral) Specific dose unknown - Active. Atorvastatin Calcium (Oral) Specific dose unknown - Active. Gabapentin (Oral) Specific dose unknown - Active. Hydrochlorothiazide (Oral) Specific dose unknown - Active. Losartan Potassium (Oral) Specific dose unknown - Active. MetFORMIN HCl (Oral) Specific dose unknown - Active. Metoprolol Tartrate (Oral) Specific dose unknown - Active. Naproxen DR (Oral) Specific dose unknown - Active. Multivitamin Adults 50+ (Oral) Active.  Vitals 06/10/2014 9:41 AM Weight: 183.6 lb Height: 63in Body Surface Area: 1.92 m Body Mass Index: 32.52 kg/m Temp.: 98.59F(Oral)  Pulse: 76 (Regular)  Resp.: 16 (Unlabored)  BP: 132/70 (Sitting, Left Arm, Standard)    Physical Exam  General - appears comfortable, no distress; not diaphorectic  HEENT -  normocephalic; sclerae clear, gaze conjugate; mucous membranes moist, dentition good; voice normal  Neck - asymmetric on extension; no palpable anterior or posterior cervical adenopathy; no palpable nodularity in the right thyroid lobe; dominant mass in the lower pole of left thyroid lobe, 3 cm, smooth, firm, mobile with swallowing  Chest - clear bilaterally with rhonchi, rales, or wheeze  Cor - regular rhythm with normal rate; no significant murmur  Ext - non-tender without significant edema or lymphedema  Neuro - grossly intact; no tremor    Assessment & Plan  NEOPLASM OF UNCERTAIN BEHAVIOR OF THYROID GLAND (237.4  D44.0)  The patient and I again reviewed her ultrasound results and her fine-needle aspiration biopsy. We discussed options for management. Patient prefers to proceed with surgery in the near future.  Patient again discussed total thyroidectomy. We discussed the risk and benefits of the procedure including the risk of recurrent laryngeal nerve injury and injury to parathyroid glands. We discussed the location of surgical incision, the hospital stay to be anticipated, and the postoperative recovery. We discussed the need for lifelong thyroid hormone replacement. She understands and wishes to proceed. We will make arrangements for surgery in the near future at a time convenient for the patient.  The risks and benefits of the procedure have been discussed at length with the patient. The patient understands the proposed procedure, potential alternative treatments, and the course of recovery to be expected. All of the patient's questions have been answered at this time. The patient wishes to proceed with surgery.  Earnstine Regal, MD, [facility] Office: [phone]

## 2014-07-19 DIAGNOSIS — D440 Neoplasm of uncertain behavior of thyroid gland: Secondary | ICD-10-CM | POA: Diagnosis not present

## 2014-07-19 LAB — BASIC METABOLIC PANEL
Anion gap: 5 (ref 5–15)
BUN: 15 mg/dL (ref 6–23)
CHLORIDE: 102 mmol/L (ref 96–112)
CO2: 28 mmol/L (ref 19–32)
Calcium: 9.7 mg/dL (ref 8.4–10.5)
Creatinine, Ser: 1.18 mg/dL — ABNORMAL HIGH (ref 0.50–1.10)
GFR calc Af Amer: 56 mL/min — ABNORMAL LOW (ref >90)
GFR calc non Af Amer: 48 mL/min — ABNORMAL LOW (ref >90)
Glucose, Bld: 137 mg/dL — ABNORMAL HIGH (ref 70–99)
POTASSIUM: 4.1 mmol/L (ref 3.5–5.1)
Sodium: 135 mmol/L (ref 135–145)

## 2014-07-19 LAB — GLUCOSE, CAPILLARY
Glucose-Capillary: 110 mg/dL — ABNORMAL HIGH (ref 70–99)
Glucose-Capillary: 98 mg/dL (ref 70–99)

## 2014-07-19 MED ORDER — HYDROCODONE-ACETAMINOPHEN 5-325 MG PO TABS: 1.0000 | ORAL_TABLET | ORAL | Status: DC | PRN

## 2014-07-19 MED ORDER — SYNTHROID 88 MCG PO TABS: 88.0000 ug | ORAL_TABLET | Freq: Every day | ORAL | Status: DC

## 2014-07-19 MED ORDER — CALCIUM CARBONATE-VITAMIN D 500-200 MG-UNIT PO TABS: 2.0000 | ORAL_TABLET | Freq: Two times a day (BID) | ORAL | Status: AC

## 2014-07-19 NOTE — Discharge Summary (Signed)
Physician Discharge Summary [facility]  Patient ID: Jeanette [name] MRN: [id number] DOB/AGE: 1951/05/18 64 y.o.  Admit date: 07/18/2014 Discharge date: 07/19/2014  Admission Diagnoses:  Multiple thyroid nodules  Discharge Diagnoses:  Principal Problem:   Neoplasm of uncertain behavior of thyroid gland Active Problems:   Multiple thyroid nodules   Discharged Condition: good  Hospital Course: Patient was admitted for observation following thyroid surgery.  Post op course was uncomplicated.  Pain was well controlled.  Tolerated diet.  Post op calcium level on morning following surgery was 9.7 mg/dl.  Patient was prepared for discharge home on POD#1.  Consults: None  Treatments: surgery: total thyroidectomy  Discharge Exam: Blood pressure 132/62, pulse 77, temperature 98.1 F (36.7 C), temperature source Oral, resp. rate 16, height 5\' 3"  (1.6 m), weight 183 lb 11.2 oz (83.326 kg), SpO2 99 %. HEENT - clear Neck - soft, wound dry and intact Chest - clear bilaterally Cor - RRR   Disposition: Home  Discharge Instructions    Diet - low sodium heart healthy    Complete by:  As directed      Discharge instructions    Complete by:  As directed   [facility]  THYROID & PARATHYROID SURGERY:  POST-OP INSTRUCTIONS  Always review your discharge instruction sheet from the facility where your surgery was performed.  A prescription for pain medication may be given to you upon discharge.  Take your pain medication as prescribed.  If narcotic pain medicine is not needed, then you may take acetaminophen (Tylenol) or ibuprofen (Advil) as needed.  Take your usually prescribed medications unless otherwise directed.  If you need a refill on your pain medication, please contact your pharmacy. They will contact our office to request authorization.  Prescriptions will not be processed after 5 pm or on weekends.  Start with a light diet upon arrival  home, such as soup and crackers or toast.  Be sure to drink plenty of fluids daily.  Resume your normal diet the day after surgery.  Most patients will experience some swelling and bruising on the chest and neck area.  Ice packs will help.  Swelling and bruising can take several days to resolve.   It is common to experience some constipation after surgery.  Increasing fluid intake and taking a stool softener will usually help or prevent this problem.  A mild laxative (Milk of Magnesia or Miralax) should be taken according to package directions if there has been no bowel movement after 48 hours.  If you have steri-strips and a gauze dressing over your incision, you may remove the gauze bandage on the second day after surgery, and you may shower at that time.  Leave your steri-strips (small skin tapes) in place directly over the incision.  These strips should remain on the skin for 7-10 days and then be removed.  You may get them wet in the shower and pat them dry.  If you have Dermabond (topical glue) over your incision, you may shower at any time following your procedure.  Leave the glue in place for 10-14 days.  When it begins to flake off around the edges, you may remove it.  You may resume regular (light) daily activities beginning the next day-such as daily self-care, walking, climbing stairs-gradually increasing activities as tolerated.  You may have sexual intercourse when it is comfortable.  Refrain from any heavy lifting or straining until approved by your doctor.  You may drive when you no  longer are taking prescription pain medication, you can comfortably wear a seatbelt, and you can safely maneuver your car and apply brakes.  You should see your doctor in the office for a follow-up appointment approximately two to three weeks after your surgery.  Make sure that you call for this appointment within a day or two after you arrive home to insure a convenient appointment time.  WHEN TO CALL YOUR  DOCTOR: -- Fever greater than 101.5 -- Inability to urinate -- Nausea and/or vomiting - persistent -- Extreme swelling or bruising -- Continued bleeding from incision -- Increased pain, redness, or drainage from the incision -- Difficulty swallowing or breathing -- Muscle cramping or spasms -- Numbness or tingling in hands or around lips  The clinic staff is available to answer your questions during regular business hours.  Please don't hesitate to call and ask to speak to one of the nurses if you have concerns.  Earnstine Regal, MD, [facility] Office: [phone]  Website: [email or web address]     Increase activity slowly    Complete by:  As directed      No dressing needed    Complete by:  As directed             Medication List    TAKE these medications        aspirin 325 MG tablet  Take 325 mg by mouth daily.     atorvastatin 20 MG tablet  Commonly known as:  LIPITOR  Take 20 mg by mouth daily at 6 PM.     calcium-vitamin D 500-200 MG-UNIT per tablet  Commonly known as:  OSCAL WITH D  Take 2 tablets by mouth 2 (two) times daily.     gabapentin 300 MG capsule  Commonly known as:  NEURONTIN  Take 300 mg by mouth 3 (three) times daily.     hydrochlorothiazide 25 MG tablet  Commonly known as:  HYDRODIURIL  Take 12.5 mg by mouth daily.     HYDROcodone-acetaminophen 5-325 MG per tablet  Commonly known as:  NORCO/VICODIN  Take 1-2 tablets by mouth every 4 (four) hours as needed for moderate pain.     losartan 100 MG tablet  Commonly known as:  COZAAR  Take 100 mg by mouth daily.     metFORMIN 1000 MG tablet  Commonly known as:  GLUCOPHAGE  Take 1,000 mg by mouth 2 (two) times daily with a meal.     multivitamin with minerals tablet  Take 1 tablet by mouth daily.     naproxen 500 MG tablet  Commonly known as:  NAPROSYN  Take 500 mg by mouth 2 (two) times daily with a meal.     pioglitazone  45 MG tablet  Commonly known as:  ACTOS  Take 45 mg by mouth daily.     SYNTHROID 88 MCG tablet  Generic drug:  levothyroxine  Take 1 tablet (88 mcg total) by mouth daily before breakfast.         Earnstine Regal, MD, [facility] Office: [phone]   Signed: Earnstine Regal 07/19/2014, 12:08 PM

## 2014-07-19 NOTE — Op Note (Signed)
Jeanette [name], Jeanette [name] NO.:  [id number]  MEDICAL RECORD NO.:  [id number]  LOCATION:  6N06C                        FACILITY:  [facility]  PHYSICIAN:  Earnstine Regal, MD      DATE OF BIRTH:  1950/09/24  DATE OF PROCEDURE:  07/18/2014                              OPERATIVE REPORT   PREOPERATIVE DIAGNOSIS:  Multinodular thyroid.  POSTOPERATIVE DIAGNOSIS:  Multinodular thyroid.  PROCEDURE:  Total thyroidectomy.  SURGEON:  Earnstine Regal, MD, FACS  ASSISTANT:  Laure Kidney, RNFA  ANESTHESIA:  General.  ESTIMATED BLOOD LOSS:  Minimal.  PREPARATION:  ChloraPrep.  COMPLICATIONS:  None.  INDICATIONS:  The patient is a 64 year old female referred by her primary care physician for thyroid nodules.  Ultrasound shows 4 sub cm nodules in the right thyroid lobe.  Left lobe contains 2 nodules with the largest measuring 2.2 cm.  Fine needle aspiration biopsy showed benign cytopathology.  The patient was offered observation versus surgical resection and decided to proceed with surgical resection of multiple thyroid nodules.  BODY OF REPORT:  Procedure was done in OR #2 at the [place].  The patient was brought to the operating room and placed in a supine position on the operating room table.  Following administration of general anesthesia, the patient was positioned and then prepped and draped in the usual aseptic fashion.  After ascertaining that an adequate level of anesthesia had been achieved, a Kocher incision was made with #15 blade.  Dissection was carried through subcutaneous tissues and platysma.  Hemostasis was achieved with the electrocautery.  Skin flaps were elevated cephalad and caudad from the thyroid notch to the sternal notch.  The Mahorner self-retaining retractor was placed for exposure.  Strap muscles were incised in the midline and dissection was begun on the left side.  Strap muscles were reflected laterally.  Left thyroid lobe  was slightly enlarged.  There was a dominant nodule in the inferior pole which appears partially cystic.  Lobe was gently mobilized with blunt dissection.  Superior pole vessels were dissected out and divided individually between small and medium Ligaclips with the Harmonic scalpel.  Gland was rolled anteriorly.  Branches of the inferior thyroid artery were divided between small Ligaclips with the Harmonic scalpel.  Inferior venous tributaries were divided between Ligaclips.  Both the superior and inferior parathyroid glands were identified and preserved on their vascular pedicle.  Recurrent laryngeal nerve was identified and preserved along its course.  Ligament of Gwenlyn Found was released with the electrocautery and the gland was mobilized onto the anterior trachea. Isthmus was mobilized across the midline.  There was no significant pyramidal lobe present.  Dry pack was placed in the left neck.  Next, we turned our attention to the right thyroid lobe.  Right thyroid lobe contains multiple small nodules.  It was approximately normal in size.  Superior pole vessels were dissected out and divided between small and medium Ligaclips with the Harmonic scalpel.  Superior parathyroid was identified and preserved.  Inferior venous tributaries were divided between medium Ligaclips with the Harmonic scalpel.  Gland was rolled anteriorly and branches of the inferior thyroid artery divided between small Ligaclips.  Recurrent laryngeal  nerve was identified and preserved along its course.  Ligament of Gwenlyn Found was released with the electrocautery leaving a small remnant of thyroid tissue immediately adjacent to the recurrent laryngeal nerve.  Remainder of the thyroid was dissected off the anterior trachea and the thyroid was completely excised.  Suture was used to mark the left superior pole. The entire thyroid gland was submitted to Pathology for review.  The neck was irrigated with warm saline.  Fibular  was placed throughout the operative field.  Strap muscles were reapproximated in the midline with interrupted 3-0 Vicryl sutures.  Platysma was closed with interrupted 3-0 Vicryl sutures.  Skin was closed with a running 4-0 Monocryl subcuticular suture.  Wound was washed and dried and Dermabond was applied to the skin as dressing.  The patient was awakened from anesthesia and brought to the recovery room.  The patient tolerated the procedure well.   Earnstine Regal, MD, [facility] Office: [phone]    TMG/MEDQ  D:  07/18/2014  T:  07/19/2014  Job:  [id number]

## 2014-07-19 NOTE — Progress Notes (Signed)
Jeanette [name] to be D/C'd Home per MD order.  Discussed with the patient and all questions fully answered.  VSS, Surgical incision site clean, dry, intact with no sign of infection. IV catheter discontinued intact. Site without signs and symptoms of complications. Dressing and pressure applied.  An After Visit Summary was printed and given to the patient. Patient received prescription.  D/c education completed with patient/family including follow up instructions, medication list, d/c activities limitations if indicated, with other d/c instructions as indicated by MD - patient able to verbalize understanding, all questions fully answered.   Patient instructed to return to ED, call 911, or call MD for any changes in condition.   Patient escorted via [place], and D/C home via private auto.  Micki Riley 07/19/2014 12:36 PM

## 2014-07-22 ENCOUNTER — Encounter (HOSPITAL_COMMUNITY): Payer: Self-pay | Admitting: Surgery

## 2014-07-22 NOTE — Progress Notes (Signed)
Quick Note:  Please contact patient and notify of benign pathology results.  Monisha Siebel M. Zahrah Sutherlin, MD, FACS [facility] Office: [phone]   ______ 

## 2014-08-07 DIAGNOSIS — E890 Postprocedural hypothyroidism: Secondary | ICD-10-CM | POA: Insufficient documentation

## 2014-09-06 ENCOUNTER — Encounter: Payer: Medicare Other | Attending: Family Medicine | Admitting: Dietician

## 2014-09-06 ENCOUNTER — Encounter: Payer: Self-pay | Admitting: Dietician

## 2014-09-06 VITALS — Ht 63.0 in | Wt 179.3 lb

## 2014-09-06 DIAGNOSIS — E119 Type 2 diabetes mellitus without complications: Secondary | ICD-10-CM | POA: Insufficient documentation

## 2014-09-06 DIAGNOSIS — I10 Essential (primary) hypertension: Secondary | ICD-10-CM | POA: Insufficient documentation

## 2014-09-06 DIAGNOSIS — Z713 Dietary counseling and surveillance: Secondary | ICD-10-CM | POA: Diagnosis not present

## 2014-09-06 NOTE — Progress Notes (Signed)
Diabetes Self-Management Education  Visit Type:  initial  Appt. Start Time: 1000 Appt. End Time: 1100  09/06/2014  [name], identified by name and date of birth, is a 64 y.o. female with a diagnosis of Diabetes: Type 2.  Other people present during visit:  Patient   ASSESSMENT  Height 5\' 3"  (1.6 m), weight 179 lb 4.8 oz (81.33 kg). Body mass index is 31.77 kg/(m^2).  Initial Visit Information:  Are you currently following a meal plan?: Yes What type of meal plan do you follow?: diabetic meal plan, low sodium            Psychosocial:     Patient Belief/Attitude about Diabetes: Motivated to manage diabetes Other persons present: Patient Patient Concerns: Nutrition/Meal planning, Healthy Lifestyle, Weight Control Learning Readiness: Change in progress  Complications:   Last HgB A1C per patient/outside source: 6.6% How often do you check your blood sugar?: 1-2 times/day Fasting Blood glucose range (mg/dL): 130-179 Postprandial Blood glucose range (mg/dL): 180-200 Number of hypoglycemic episodes per month: 0 Number of hyperglycemic episodes per week: 0  Diet Intake:  Breakfast: oatmeal OR bacon, eggs, and wheat toast OR cheese toaste with coffee with splenda or stevia Snack (morning): sometimes trail mix with nuts and m&m's Lunch: vegetable OR long grain rice and meat  Snack (afternoon): sometimes fruit or nab crackers or Tom's potato chips Dinner: meat, veggie, sometimes and mac n cheese or potatoes Snack (evening): sometimes fruit with cool whip or cookies (oreos) Beverage(s): coffee, 1 cup diet sierra mist/day, water  Exercise:  Exercise: Moderate (swimming / aerobic walking) Moderate Exercise amount of time (min / week): 150 (3 days a week water aerobics and 3 days a week strength training)  Individualized Plan for Diabetes Self-Management Training:   Learning Objective:  Patient will have a greater understanding of diabetes  self-management.  Patient education plan per assessed needs and concerns is to attend individual sessions for     Education Topics Reviewed with Patient Today:    Role of diet in the treatment of diabetes and the relationship between the three main macronutrients and blood glucose level, Food label reading, portion sizes and measuring food., Carbohydrate counting                  PATIENTS GOALS/Plan (Developed by the patient):  Nutrition: Follow meal plan discussed, General guidelines for healthy choices and portions discussed Physical Activity: Exercise 5-7 days per week Medications: take my medication as prescribed  Plan:   Goals:  Follow Diabetes Meal Plan as instructed  Eat 3 meals and 2 snacks, every 3-5 hrs  Limit carbohydrate intake to 30-45 grams carbohydrate/meal  Limit carbohydrate intake to 15-30 grams carbohydrate/snack  Choose food items/products with < 9 grams of sugar per serving  Choose high fiber foods with 5 grams or more per serving  Add lean protein foods to meals/snacks  Choose fish 2-3 times per week and limit red meats to 2-3 times per week  Choose heart healthy fats (plant based) and limit unhealthy fats (animal sources)  Choose low-fat dairy products such as milk, yogurt, salad dressings and cheese  Choose salt-free flavorings, herbs, and spices  Monitor glucose levels as instructed by your doctor  Keep up the great work with daily exercise!   Expected Outcomes:  Demonstrated interest in learning. Expect positive outcomes  Education material provided: Meal plan card, My Plate and Snack sheet  If problems or questions, patient to contact team via:  Phone and Email  Future DSME appointment: PRN

## 2014-10-07 ENCOUNTER — Other Ambulatory Visit: Payer: Medicare Other

## 2014-11-22 ENCOUNTER — Telehealth: Payer: Self-pay | Admitting: Hematology and Oncology

## 2014-11-22 NOTE — Telephone Encounter (Signed)
Faxed pt medical records to [facility] [phone]

## 2014-11-24 ENCOUNTER — Emergency Department (HOSPITAL_COMMUNITY)
Admission: EM | Admit: 2014-11-24 | Discharge: 2014-11-24 | Disposition: A | Discharge status: Home / Self Care | Payer: Medicare Other | Source: Home / Self Care

## 2014-11-24 ENCOUNTER — Emergency Department (INDEPENDENT_AMBULATORY_CARE_PROVIDER_SITE_OTHER): Payer: Medicare Other

## 2014-11-24 ENCOUNTER — Encounter (HOSPITAL_COMMUNITY): Payer: Self-pay | Admitting: *Deleted

## 2014-11-24 DIAGNOSIS — R030 Elevated blood-pressure reading, without diagnosis of hypertension: Secondary | ICD-10-CM

## 2014-11-24 DIAGNOSIS — M79675 Pain in left toe(s): Secondary | ICD-10-CM | POA: Diagnosis not present

## 2014-11-24 DIAGNOSIS — IMO0001 Reserved for inherently not codable concepts without codable children: Secondary | ICD-10-CM

## 2014-11-24 NOTE — ED Notes (Signed)
Pt      She  Stubbed  Her  l  2nd tor  On a  Bedpost     sev  Days  Ago     The pt  Has  Swelling     And  Bruising of  The     l     2nd  Toe

## 2014-11-24 NOTE — ED Provider Notes (Signed)
CSN: [id number]     Arrival date & time 11/24/14  1405 History   None    Chief Complaint  Patient presents with  . Toe Pain    HPI  63 yof stubbed left 2nd toe. Was putting box spring in place on bed 2 days ago and while stepping out jammed toe on bed frame. Walking on it past 2 days but very tender. Pain has persisted, length of phalynx.   Elevated bp noted today. Pt denies cp, sob. States she checks regularly at home and runs normally. Takes both her bp meds as prescribed. Sees her pcp regularly.   Past Medical History  Diagnosis Date  . Hypertension   . Hyperlipidemia   . Neuromuscular disorder     neuropathy hands and feet  . Heart murmur     many years ago  . Pneumonia ~ 2010  . Type II diabetes mellitus   . Hypothyroidism     thyroidectomy 07/18/2014  . Arthritis     "back" (07/18/2014)  . Chronic back pain   . Anemia   . Cancer of left breast    Past Surgical History  Procedure Laterality Date  . Abdominal hysterectomy  1970's  . Tonsillectomy    . Bilateral oophorectomy  1980j's  . Bunionectomy Right   . Cardiac catheterization  02/16/2011    NL coronares, EF 65%, no AS or MR (Dr. Corliss Parish; [place])  . Total thyroidectomy  07/18/2014  . Appendectomy    . Carpal tunnel release Bilateral   . Mastectomy Left 2008  . Breast biopsy Left 2008  . Thyroidectomy N/A 07/18/2014    Procedure: TOTAL THYROIDECTOMY;  Surgeon: Armandina Gemma, MD;  Location: [facility];  Service: General;  Laterality: N/A;   History reviewed. No pertinent family history. History  Substance Use Topics  . Smoking status: Former Smoker -- 0.25 packs/day for 5 years    Types: Cigarettes    Quit date: 05/24/1970  . Smokeless tobacco: Never Used  . Alcohol Use: Yes     Comment: 07/18/2014 "quit drinking in the 1980's"   OB History    No data available     Review of Systems  Allergies  Review of patient's allergies indicates no known allergies.  Home Medications   Prior to  Admission medications   Medication Sig Start Date End Date Taking? Authorizing Provider  aspirin 325 MG tablet Take 325 mg by mouth daily.  02/16/11   Historical Provider, MD  atorvastatin (LIPITOR) 20 MG tablet Take 20 mg by mouth daily at 6 PM.  02/16/11   Historical Provider, MD  calcium-vitamin D (OSCAL WITH D) 500-200 MG-UNIT per tablet Take 2 tablets by mouth 2 (two) times daily. 07/19/14   Armandina Gemma, MD  gabapentin (NEURONTIN) 300 MG capsule Take 300 mg by mouth 3 (three) times daily.    Historical Provider, MD  hydrochlorothiazide (HYDRODIURIL) 25 MG tablet Take 12.5 mg by mouth daily.  02/16/11   Historical Provider, MD  HYDROcodone-acetaminophen (NORCO/VICODIN) 5-325 MG per tablet Take 1-2 tablets by mouth every 4 (four) hours as needed for moderate pain. 07/19/14   Armandina Gemma, MD  losartan (COZAAR) 100 MG tablet Take 100 mg by mouth daily.  02/16/11   Historical Provider, MD  metFORMIN (GLUCOPHAGE) 1000 MG tablet Take 1,000 mg by mouth 2 (two) times daily with a meal.  02/16/11   Historical Provider, MD  Multiple Vitamins-Minerals (MULTIVITAMIN WITH MINERALS) tablet Take 1 tablet by mouth daily.  02/16/11  Historical Provider, MD  naproxen (NAPROSYN) 500 MG tablet Take 500 mg by mouth 2 (two) times daily with a meal.  02/16/11   Historical Provider, MD  pioglitazone (ACTOS) 45 MG tablet Take 45 mg by mouth daily.    Historical Provider, MD  SYNTHROID 88 MCG tablet Take 1 tablet (88 mcg total) by mouth daily before breakfast. 07/19/14   Armandina Gemma, MD   BP 189/83 mmHg  Pulse 77  Temp(Src) 98 F (36.7 C) (Oral)  Resp 18  SpO2 98% Physical Exam  Constitutional: She is oriented to person, place, and time. She appears well-developed and well-nourished.  Non-toxic appearance. She does not have a sickly appearance. She does not appear ill. No distress.  Cardiovascular: Normal rate, regular rhythm and normal heart sounds.   Musculoskeletal:       Left ankle: Normal.       Feet:  Bruising  over left 2nd phalynx. Swelling at PIP joint. Normal sensation. Normal cap refill. Decreased rom secondary to pain. TTP length of phalynx.   Neurological: She is alert and oriented to person, place, and time.   ED Course  Procedures (including critical care time) Labs Review Labs Reviewed - No data to display  Imaging Review Dg Toe 2nd Left  11/24/2014   CLINICAL DATA:  Status post trauma to the second toe down Friday. Patient has toe bruise and pain.  EXAM: LEFT SECOND TOE  COMPARISON:  None.  FINDINGS: There is no evidence of fracture or dislocation. Soft tissues are unremarkable.  IMPRESSION: No acute fracture or dislocation.   Electronically Signed   By: Abelardo Diesel M.D.   On: 11/24/2014 16:35    MDM   1. Toe pain, left   2. Elevated BP    Xrays negative on toe. Ibuprofen/tylenol for pain, swelling. Ice, rest, elevation. Buddy taped toe today for support. F/U with pcp if no relief 1-2 weeks. F/U with pcp for elevated bp noted today.     Araceli Bouche, [place] 11/25/14 2128

## 2014-11-24 NOTE — Discharge Instructions (Signed)
You did not fracture your toe today.  For pain please cycle between ibuprofen and tylenol every 4 hours.  Ice, rest, and elevating the foot will help with the pain. We buddy taped the toe today.  Please be sure to follow up with you pcp with the elevated blood pressure.

## 2014-12-11 DIAGNOSIS — M1611 Unilateral primary osteoarthritis, right hip: Secondary | ICD-10-CM | POA: Insufficient documentation

## 2014-12-11 DIAGNOSIS — M47816 Spondylosis without myelopathy or radiculopathy, lumbar region: Secondary | ICD-10-CM | POA: Insufficient documentation

## 2014-12-12 NOTE — Assessment & Plan Note (Signed)
Left breast cancer status post left mastectomy December 2000 and T2, N1, M0 stage IIB ER/PR positive HER-2 negative followed by adjuvant chemotherapy and radiation started Arimidex July 2009 and completed 03/22/2014  I discussed with her the recent data suggesting that patients with high risk node positive breast cancer may benefit from extended adjuvant therapy. We also discussed the role of breast cancer index in determining who would benefit from extended adjuvant therapy. We will send her tissue if possible for [facility] and determine if she needs to resume antiestrogen therapy.  Breast Cancer Surveillance: 1. Breast exam 12/13/2014: Normal 2. Mammogram  No abnormalities. Postsurgical changes. Breast Density Category . I recommended that she get 3-D mammograms for surveillance. Discussed the differences between different breast density categories.  Return to clinic in 1 year for follow-up

## 2014-12-13 ENCOUNTER — Encounter: Payer: Self-pay | Admitting: Hematology and Oncology

## 2014-12-13 ENCOUNTER — Telehealth: Payer: Self-pay | Admitting: Hematology and Oncology

## 2014-12-13 ENCOUNTER — Ambulatory Visit (HOSPITAL_BASED_OUTPATIENT_CLINIC_OR_DEPARTMENT_OTHER): Payer: Medicare Other | Admitting: Hematology and Oncology

## 2014-12-13 VITALS — BP 131/63 | HR 66 | Temp 98.1°F | Resp 18 | Ht 63.0 in | Wt 181.6 lb

## 2014-12-13 DIAGNOSIS — C50412 Malignant neoplasm of upper-outer quadrant of left female breast: Secondary | ICD-10-CM

## 2014-12-13 DIAGNOSIS — Z853 Personal history of malignant neoplasm of breast: Secondary | ICD-10-CM

## 2014-12-13 NOTE — Progress Notes (Signed)
Patient Care Team: [name], MD as PCP - General (Family Medicine)  DIAGNOSIS: Breast cancer of upper-outer quadrant of left female breast   Staging form: Breast, AJCC 7th Edition     Clinical: No stage assigned - Unsigned     Pathologic: Stage IIA (T2, N1a, cM0) - Signed by Rulon Eisenmenger, MD on 03/22/2014   SUMMARY OF ONCOLOGIC HISTORY:   Breast cancer of upper-outer quadrant of left female breast   03/23/2007 Initial Diagnosis Breast cancer of upper-outer quadrant of left female breast   05/09/2007 Surgery Left mastectomy T2, N1, M0 stage IIB ER/PR positive HER-2 negative (based on patient's description.- no records available)   06/13/2007 - 08/02/2007 Chemotherapy Taxotere Cytoxan x4   09/21/2007 - 11/01/2007 Radiation Therapy Adjuvant radiation therapy   11/28/2007 - 03/22/2014 Anti-estrogen oral therapy Arimidex 1 mg daily   07/18/2014 Surgery total thyroidectomy: Benign multinodular goiter    CHIEF COMPLIANT: Follow-up of left breast cancer  INTERVAL HISTORY: Jeanette [name] is a 64 year old with above-mentioned history left breast cancer treated with mastectomy followed by adjuvant chemotherapy radiation and Arimidex in 2015. She is here for a routine follow-up she has not had a mammogram this year. She needs to have it set up soon. She had a recent thyroidectomy in February 2016. Apart from that she is without any concerns.  REVIEW OF SYSTEMS:   Constitutional: Denies fevers, chills or abnormal weight loss Eyes: Denies blurriness of vision Ears, nose, mouth, throat, and face: Denies mucositis or sore throat Respiratory: Denies cough, dyspnea or wheezes Cardiovascular: Denies palpitation, chest discomfort or lower extremity swelling Gastrointestinal:  Denies nausea, heartburn or change in bowel habits Skin: Denies abnormal skin rashes Lymphatics: Denies new lymphadenopathy or easy bruising Neurological:Denies numbness, tingling or new weaknesses Behavioral/Psych: Mood  is stable, no new changes  Breast:  denies any pain or lumps or nodules in right breast All other systems were reviewed with the patient and are negative.  I have reviewed the past medical history, past surgical history, social history and family history with the patient and they are unchanged from previous note.  ALLERGIES:  has No Known Allergies.  MEDICATIONS:  Current Outpatient Prescriptions  Medication Sig Dispense Refill  . aspirin 325 MG tablet Take 325 mg by mouth daily.     Marland Kitchen atorvastatin (LIPITOR) 20 MG tablet Take 20 mg by mouth daily at 6 PM.     . calcium-vitamin D (OSCAL WITH D) 500-200 MG-UNIT per tablet Take 2 tablets by mouth 2 (two) times daily. 60 tablet 1  . gabapentin (NEURONTIN) 300 MG capsule Take 300 mg by mouth 3 (three) times daily.    . hydrochlorothiazide (HYDRODIURIL) 25 MG tablet Take 12.5 mg by mouth daily.     Marland Kitchen losartan (COZAAR) 100 MG tablet Take 100 mg by mouth daily.     . metFORMIN (GLUCOPHAGE) 1000 MG tablet Take 1,000 mg by mouth 2 (two) times daily with a meal.     . Multiple Vitamins-Minerals (MULTIVITAMIN WITH MINERALS) tablet Take 1 tablet by mouth daily.     . nabumetone (RELAFEN) 500 MG tablet     . naproxen (NAPROSYN) 500 MG tablet Take 500 mg by mouth 2 (two) times daily with a meal.     . pioglitazone (ACTOS) 45 MG tablet Take 45 mg by mouth daily.    Marland Kitchen SYNTHROID 88 MCG tablet Take 1 tablet (88 mcg total) by mouth daily before breakfast. 30 tablet 3   No current facility-administered medications for this  visit.    PHYSICAL EXAMINATION: ECOG PERFORMANCE STATUS: 0 - Asymptomatic  Filed Vitals:   12/13/14 0909  BP: 131/63  Pulse: 66  Temp: 98.1 F (36.7 C)  Resp: 18   Filed Weights   12/13/14 0909  Weight: 181 lb 9.6 oz (82.373 kg)    GENERAL:alert, no distress and comfortable SKIN: skin color, texture, turgor are normal, no rashes or significant lesions EYES: normal, Conjunctiva are pink and non-injected, sclera  clear OROPHARYNX:no exudate, no erythema and lips, buccal mucosa, and tongue normal  NECK: supple, thyroid normal size, non-tender, without nodularity LYMPH:  no palpable lymphadenopathy in the cervical, axillary or inguinal LUNGS: clear to auscultation and percussion with normal breathing effort HEART: regular rate & rhythm and no murmurs and no lower extremity edema ABDOMEN:abdomen soft, non-tender and normal bowel sounds Musculoskeletal:no cyanosis of digits and no clubbing  NEURO: alert & oriented x 3 with fluent speech, no focal motor/sensory deficits BREAST: No palpable lumps or nodules in the right breast and axilla left chest wall is without any problems.. (exam performed in the presence of a chaperone)  LABORATORY DATA:  I have reviewed the data as listed   Chemistry      Component Value Date/Time   NA 135 07/19/2014 0500   NA 143 03/22/2014 1140   K 4.1 07/19/2014 0500   K 4.0 03/22/2014 1140   CL 102 07/19/2014 0500   CO2 28 07/19/2014 0500   CO2 29 03/22/2014 1140   BUN 15 07/19/2014 0500   BUN 19.6 03/22/2014 1140   CREATININE 1.18* 07/19/2014 0500   CREATININE 1.1 03/22/2014 1140      Component Value Date/Time   CALCIUM 9.7 07/19/2014 0500   CALCIUM 10.3 03/22/2014 1140   ALKPHOS 58 03/22/2014 1140   AST 18 03/22/2014 1140   ALT 20 03/22/2014 1140   BILITOT 1.10 03/22/2014 1140       Lab Results  Component Value Date   WBC 3.7* 07/12/2014   HGB 11.7* 07/12/2014   HCT 36.8 07/12/2014   MCV 82.7 07/12/2014   PLT PLATELET CLUMPS NOTED ON SMEAR, UNABLE TO ESTIMATE 07/18/2014   NEUTROABS 2.1 03/22/2014   ASSESSMENT & PLAN:  Breast cancer of upper-outer quadrant of left female breast Left breast cancer status post left mastectomy December 2000 and T2, N1, M0 stage IIB ER/PR positive HER-2 negative followed by adjuvant chemotherapy and radiation started Arimidex July 2009 and completed 03/22/2014  Breast Cancer Surveillance: 1. Breast exam 12/13/2014:  Normal 2. Mammogram: Will need to be scheduled for next week  Survivorship: Discussed the importance of physical exercise in decreasing the likelihood of breast cancer recurrence. Recommended 30 mins daily 6 days a week of either brisk walking or cycling or swimming. Encouraged patient to eat more fruits and vegetables and decrease red meat.   Return to clinic in 1 year for follow-up with survivorship and his practitioner    Orders Placed This Encounter  Procedures  . MM Digital Diagnostic Unilat R    Standing Status: Future     Number of Occurrences:      Standing Expiration Date: 12/13/2015    Order Specific Question:  Reason for Exam (SYMPTOM  OR DIAGNOSIS REQUIRED)    Answer:  Left breast cancer S/P Mastectomy    Order Specific Question:  Preferred imaging location?    Answer:  [facility]   The patient has a good understanding of the overall plan. she agrees with it. she will call with any problems that may  develop before the next visit here.   Rahkim Rabalais K, MD      

## 2014-12-13 NOTE — Telephone Encounter (Signed)
Gave avs & calendar for August. Sent message to schedule survivorship 1year

## 2014-12-13 NOTE — Addendum Note (Signed)
Addended by: Prentiss Bells on: 12/13/2014 09:34 AM   Modules accepted: Orders, Medications

## 2014-12-17 ENCOUNTER — Telehealth: Payer: Self-pay

## 2014-12-17 NOTE — Telephone Encounter (Signed)
Mammogram results rcvd from [facility] 01/2013.  Reviewed by Dr. Lindi Adie.  Sent to scan.

## 2014-12-18 ENCOUNTER — Telehealth: Payer: Self-pay

## 2014-12-18 NOTE — Telephone Encounter (Signed)
Order faxed to [facility].  Sent to scan. 

## 2014-12-27 ENCOUNTER — Other Ambulatory Visit: Payer: Self-pay | Admitting: Hematology and Oncology

## 2014-12-27 ENCOUNTER — Ambulatory Visit
Admission: RE | Admit: 2014-12-27 | Discharge: 2014-12-27 | Disposition: A | Discharge status: Home / Self Care | Payer: Medicare Other | Source: Ambulatory Visit | Attending: Hematology and Oncology | Admitting: Hematology and Oncology

## 2014-12-27 DIAGNOSIS — C50412 Malignant neoplasm of upper-outer quadrant of left female breast: Secondary | ICD-10-CM

## 2015-02-22 ENCOUNTER — Emergency Department (HOSPITAL_COMMUNITY): Payer: Medicare Other

## 2015-02-22 ENCOUNTER — Encounter (HOSPITAL_COMMUNITY): Payer: Self-pay | Admitting: *Deleted

## 2015-02-22 ENCOUNTER — Inpatient Hospital Stay (HOSPITAL_COMMUNITY)
Admission: EM | Admit: 2015-02-22 | Discharge: 2015-02-25 | DRG: 065 | Disposition: A | Discharge status: Home Health | Payer: Medicare Other | Attending: Internal Medicine | Admitting: Internal Medicine

## 2015-02-22 DIAGNOSIS — T451X5A Adverse effect of antineoplastic and immunosuppressive drugs, initial encounter: Secondary | ICD-10-CM | POA: Diagnosis present

## 2015-02-22 DIAGNOSIS — I6789 Other cerebrovascular disease: Secondary | ICD-10-CM | POA: Diagnosis not present

## 2015-02-22 DIAGNOSIS — N183 Chronic kidney disease, stage 3 unspecified: Secondary | ICD-10-CM

## 2015-02-22 DIAGNOSIS — E0800 Diabetes mellitus due to underlying condition with hyperosmolarity without nonketotic hyperglycemic-hyperosmolar coma (NKHHC): Secondary | ICD-10-CM

## 2015-02-22 DIAGNOSIS — Z7984 Long term (current) use of oral hypoglycemic drugs: Secondary | ICD-10-CM | POA: Diagnosis not present

## 2015-02-22 DIAGNOSIS — E1122 Type 2 diabetes mellitus with diabetic chronic kidney disease: Secondary | ICD-10-CM | POA: Diagnosis present

## 2015-02-22 DIAGNOSIS — D630 Anemia in neoplastic disease: Secondary | ICD-10-CM | POA: Diagnosis present

## 2015-02-22 DIAGNOSIS — I130 Hypertensive heart and chronic kidney disease with heart failure and stage 1 through stage 4 chronic kidney disease, or unspecified chronic kidney disease: Secondary | ICD-10-CM | POA: Diagnosis present

## 2015-02-22 DIAGNOSIS — D6481 Anemia due to antineoplastic chemotherapy: Secondary | ICD-10-CM | POA: Diagnosis present

## 2015-02-22 DIAGNOSIS — E890 Postprocedural hypothyroidism: Secondary | ICD-10-CM | POA: Diagnosis present

## 2015-02-22 DIAGNOSIS — M549 Dorsalgia, unspecified: Secondary | ICD-10-CM | POA: Diagnosis present

## 2015-02-22 DIAGNOSIS — G8929 Other chronic pain: Secondary | ICD-10-CM | POA: Diagnosis present

## 2015-02-22 DIAGNOSIS — Z853 Personal history of malignant neoplasm of breast: Secondary | ICD-10-CM | POA: Diagnosis not present

## 2015-02-22 DIAGNOSIS — E1140 Type 2 diabetes mellitus with diabetic neuropathy, unspecified: Secondary | ICD-10-CM

## 2015-02-22 DIAGNOSIS — Z9221 Personal history of antineoplastic chemotherapy: Secondary | ICD-10-CM | POA: Diagnosis not present

## 2015-02-22 DIAGNOSIS — G43909 Migraine, unspecified, not intractable, without status migrainosus: Secondary | ICD-10-CM | POA: Diagnosis present

## 2015-02-22 DIAGNOSIS — E119 Type 2 diabetes mellitus without complications: Secondary | ICD-10-CM

## 2015-02-22 DIAGNOSIS — I5032 Chronic diastolic (congestive) heart failure: Secondary | ICD-10-CM

## 2015-02-22 DIAGNOSIS — R278 Other lack of coordination: Secondary | ICD-10-CM | POA: Diagnosis present

## 2015-02-22 DIAGNOSIS — Z823 Family history of stroke: Secondary | ICD-10-CM | POA: Diagnosis not present

## 2015-02-22 DIAGNOSIS — Z8673 Personal history of transient ischemic attack (TIA), and cerebral infarction without residual deficits: Secondary | ICD-10-CM | POA: Diagnosis not present

## 2015-02-22 DIAGNOSIS — E785 Hyperlipidemia, unspecified: Secondary | ICD-10-CM | POA: Diagnosis present

## 2015-02-22 DIAGNOSIS — I10 Essential (primary) hypertension: Secondary | ICD-10-CM | POA: Diagnosis not present

## 2015-02-22 DIAGNOSIS — Z7982 Long term (current) use of aspirin: Secondary | ICD-10-CM | POA: Diagnosis not present

## 2015-02-22 DIAGNOSIS — Z87891 Personal history of nicotine dependence: Secondary | ICD-10-CM | POA: Diagnosis not present

## 2015-02-22 DIAGNOSIS — Z923 Personal history of irradiation: Secondary | ICD-10-CM | POA: Diagnosis not present

## 2015-02-22 DIAGNOSIS — Z9010 Acquired absence of unspecified breast and nipple: Secondary | ICD-10-CM

## 2015-02-22 DIAGNOSIS — Z6831 Body mass index (BMI) 31.0-31.9, adult: Secondary | ICD-10-CM

## 2015-02-22 DIAGNOSIS — E669 Obesity, unspecified: Secondary | ICD-10-CM | POA: Diagnosis present

## 2015-02-22 DIAGNOSIS — Z79899 Other long term (current) drug therapy: Secondary | ICD-10-CM | POA: Diagnosis not present

## 2015-02-22 DIAGNOSIS — I639 Cerebral infarction, unspecified: Principal | ICD-10-CM | POA: Diagnosis present

## 2015-02-22 HISTORY — DX: Cerebral infarction, unspecified: I63.9

## 2015-02-22 LAB — BASIC METABOLIC PANEL
Anion gap: 8 (ref 5–15)
BUN: 14 mg/dL (ref 6–20)
CHLORIDE: 107 mmol/L (ref 101–111)
CO2: 25 mmol/L (ref 22–32)
Calcium: 9.4 mg/dL (ref 8.9–10.3)
Creatinine, Ser: 1.22 mg/dL — ABNORMAL HIGH (ref 0.44–1.00)
GFR calc Af Amer: 54 mL/min — ABNORMAL LOW (ref >60)
GFR calc non Af Amer: 46 mL/min — ABNORMAL LOW (ref >60)
Glucose, Bld: 94 mg/dL (ref 65–99)
Potassium: 5 mmol/L (ref 3.5–5.1)
SODIUM: 140 mmol/L (ref 135–145)

## 2015-02-22 LAB — CBC WITH DIFFERENTIAL/PLATELET
Basophils Absolute: 0 10*3/uL (ref 0.0–0.1)
Basophils Relative: 0 %
EOS ABS: 0.1 10*3/uL (ref 0.0–0.7)
EOS PCT: 2 %
HCT: 36.7 % (ref 36.0–46.0)
Hemoglobin: 11.6 g/dL — ABNORMAL LOW (ref 12.0–15.0)
Lymphocytes Relative: 45 %
Lymphs Abs: 2.2 10*3/uL (ref 0.7–4.0)
MCH: 26.4 pg (ref 26.0–34.0)
MCHC: 31.6 g/dL (ref 30.0–36.0)
MCV: 83.4 fL (ref 78.0–100.0)
MONOS PCT: 6 %
Monocytes Absolute: 0.3 10*3/uL (ref 0.1–1.0)
Neutro Abs: 2.3 10*3/uL (ref 1.7–7.7)
Neutrophils Relative %: 46 %
PLATELETS: UNDETERMINED 10*3/uL (ref 150–400)
RBC: 4.4 MIL/uL (ref 3.87–5.11)
RDW: 15.7 % — ABNORMAL HIGH (ref 11.5–15.5)
WBC: 4.9 10*3/uL (ref 4.0–10.5)

## 2015-02-22 MED ORDER — PROMETHAZINE HCL 25 MG PO TABS
25.0000 mg | ORAL_TABLET | Freq: Once | ORAL | Status: AC
  Administered 2015-02-22: 25 mg via ORAL
  Filled 2015-02-22: qty 1

## 2015-02-22 MED ORDER — TRAMADOL HCL 50 MG PO TABS
50.0000 mg | ORAL_TABLET | Freq: Once | ORAL | Status: AC
  Administered 2015-02-22: 50 mg via ORAL
  Filled 2015-02-22: qty 1

## 2015-02-22 NOTE — ED Provider Notes (Signed)
CSN: [id number]     Arrival date & time 02/22/15  1745 History   First MD Initiated Contact with Patient 02/22/15 1913     Chief Complaint  Patient presents with  . Headache     (Consider location/radiation/quality/duration/timing/severity/associated sxs/prior Treatment) HPI 64 year old female who presents with headache. No prior history of headaches and has a history of type 2 diabetes complicated by diabetic neuropathy, hypertension, and hyperlipidemia. She also has a history of left-sided breast cancer status post mastectomy and chemotherapy radiation as of October 2015. She reports 2 week of intermittent headaches initially frontal but now radiating to the back of her head. Not associated with time of day. This is associated with lightheadedness and dizziness. Denies associating lightheadedness, describes the dizziness as being unsteady on her feet and the sensation that the room may be wobbly in front of her. Headaches occuring on and off throughout the day, but occurs daily and improves with tylenol. Pain gradually onsets and progressives over several hours to 8/10 in severity at the worst.   Denies any associating vomiting, numbness or weakness, double vision, slurring speech, or difficulty swallowing. She has recently had increased allergies with mild cough, and has been taking over-the-counter medications for this. Denies that her symptoms are positional in nature.   Past Medical History  Diagnosis Date  . Hypertension   . Hyperlipidemia   . Neuromuscular disorder (HCC)     neuropathy hands and feet  . Heart murmur     many years ago  . Pneumonia ~ 2010  . Type II diabetes mellitus ([place])   . Hypothyroidism     thyroidectomy 07/18/2014  . Arthritis     "back" (07/18/2014)  . Chronic back pain   . Anemia   . Cancer of left breast ([place])   . Stroke [facility])    Past Surgical History  Procedure Laterality Date  . Abdominal hysterectomy  1970's  . Tonsillectomy    . Bilateral  oophorectomy  1980j's  . Bunionectomy Right   . Cardiac catheterization  02/16/2011    NL coronares, EF 65%, no AS or MR (Dr. Corliss Parish; [place])  . Total thyroidectomy  07/18/2014  . Appendectomy    . Carpal tunnel release Bilateral   . Mastectomy Left 2008  . Breast biopsy Left 2008  . Thyroidectomy N/A 07/18/2014    Procedure: TOTAL THYROIDECTOMY;  Surgeon: Armandina Gemma, MD;  Location: [facility] OR;  Service: General;  Laterality: N/A;   Family History  Problem Relation Age of Onset  . Hypertension Father   . Diabetes Father   . Stroke Father   . Hypertension Sister   . Diabetes Sister   . Stroke Sister    Social History  Substance Use Topics  . Smoking status: Former Smoker -- 0.25 packs/day for 5 years    Types: Cigarettes    Quit date: 05/24/1970  . Smokeless tobacco: Never Used  . Alcohol Use: Yes     Comment: 07/18/2014 "quit drinking in the 1980's"   OB History    No data available     Review of Systems 10/14 systems reviewed and are negative other than those stated in the HPI    Allergies  Review of patient's allergies indicates no known allergies.  Home Medications   Prior to Admission medications   Medication Sig Start Date End Date Taking? Authorizing Provider  aspirin 325 MG tablet Take 325 mg by mouth daily.  02/16/11  Yes Historical Provider, MD  atorvastatin (LIPITOR) 20  MG tablet Take 20 mg by mouth every morning.  02/16/11  Yes Historical Provider, MD  benzonatate (TESSALON) 200 MG capsule Take 200 mg by mouth 3 (three) times daily as needed. 02/13/15  Yes Historical Provider, MD  calcium-vitamin D (OSCAL WITH D) 500-200 MG-UNIT per tablet Take 2 tablets by mouth 2 (two) times daily. 07/19/14  Yes Armandina Gemma, MD  fluticasone (FLONASE) 50 MCG/ACT nasal spray Place 2 sprays into both nostrils daily. 02/21/15 03/23/15 Yes Historical Provider, MD  hydrochlorothiazide (HYDRODIURIL) 12.5 MG tablet Take 12.5 mg by mouth daily. 02/07/15  Yes Historical  Provider, MD  losartan (COZAAR) 100 MG tablet Take 100 mg by mouth daily.  02/16/11  Yes Historical Provider, MD  metFORMIN (GLUCOPHAGE) 1000 MG tablet Take 1,000 mg by mouth 2 (two) times daily with a meal.  02/16/11  Yes Historical Provider, MD  Multiple Vitamins-Minerals (MULTIVITAMIN WITH MINERALS) tablet Take 1 tablet by mouth daily.  02/16/11  Yes Historical Provider, MD  pioglitazone (ACTOS) 45 MG tablet Take 45 mg by mouth daily.   Yes Historical Provider, MD  SYNTHROID 88 MCG tablet Take 1 tablet (88 mcg total) by mouth daily before breakfast. 07/19/14  Yes Armandina Gemma, MD  traMADol (ULTRAM) 50 MG tablet Take 50 mg by mouth daily as needed for moderate pain.  01/17/15  Yes Historical Provider, MD  gabapentin (NEURONTIN) 300 MG capsule Take 300 mg by mouth 3 (three) times daily.    Historical Provider, MD   BP 151/83 mmHg  Pulse 74  Temp(Src) 97.8 F (36.6 C) (Oral)  Resp 16  Ht 5\' 3"  (1.6 m)  Wt 178 lb 9.2 oz (81 kg)  BMI 31.64 kg/m2  SpO2 99% Physical Exam Physical Exam  Nursing note and vitals reviewed. Constitutional: Well developed, well nourished, non-toxic, and in no acute distress Head: Normocephalic and atraumatic.  Mouth/Throat: Oropharynx is clear and moist.  Neck: Normal range of motion. Neck supple.  Cardiovascular: Normal rate and regular rhythm.   Pulmonary/Chest: Effort normal and breath sounds normal.  Abdominal: Soft. There is no tenderness. There is no rebound and no guarding.  Musculoskeletal: Normal range of motion.  Skin: Skin is warm and dry.  Psychiatric: Cooperative  Neurological:  Alert, oriented to person, place, time, and situation. Memory grossly in tact. Fluent speech. No dysarthria or aphasia.  Cranial nerves: VF are full.  Pupils are symmetric, and reactive to light. EOMI without nystagmus. No gaze deviation. Facial muscles symmetric with activation. Sensation to light touch over face in tact bilaterally. Hearing grossly in tact. Palate elevates  symmetrically. Head turn and shoulder shrug are intact. Tongue midline.  Muscle bulk and tone normal. No pronator drift. Moves all extremities symmetrically. Sensation to light touch is in tact throughout in bilateral upper and lower extremities. Coordination reveals dysmetria with finger to nose in the RUE. Gait is narrow-based but difficulty ambulating on own several feet without having to hold onto the something to prevent fall.    ED Course  Procedures (including critical care time) Labs Review Labs Reviewed  CBC WITH DIFFERENTIAL/PLATELET - Abnormal; Notable for the following:    Hemoglobin 11.6 (*)    RDW 15.7 (*)    All other components within normal limits  BASIC METABOLIC PANEL - Abnormal; Notable for the following:    Creatinine, Ser 1.22 (*)    GFR calc non Af Amer 46 (*)    GFR calc Af Amer 54 (*)    All other components within normal limits  GLUCOSE, CAPILLARY -  Abnormal; Notable for the following:    Glucose-Capillary 145 (*)    All other components within normal limits  CBC  CREATININE, SERUM  HEMOGLOBIN A1C  LIPID PANEL    Imaging Review Dg Chest 2 View  02/22/2015   CLINICAL DATA:  Headache. Gait instability for 2 weeks. Hypertension. Diabetes. Ex-smoker.  EXAM: CHEST  2 VIEW  COMPARISON:  04/05/2014  FINDINGS: Midline trachea. Normal heart size and mediastinal contours. No pleural effusion or pneumothorax. Surgical changes of left mastectomy and axillary node dissection. Clear right lung. A left mid lung granuloma causes increased density projecting over the scapular tip.  IMPRESSION: No acute cardiopulmonary disease.   Electronically Signed   By: Abigail Miyamoto M.D.   On: 02/22/2015 21:08   Ct Head Wo Contrast  02/22/2015   CLINICAL DATA:  Acute headache and dizziness for 2 weeks.  EXAM: CT HEAD WITHOUT CONTRAST  TECHNIQUE: Contiguous axial images were obtained from the base of the skull through the vertex without intravenous contrast.  COMPARISON:  None.  FINDINGS:  No intracranial abnormalities are identified, including mass lesion or mass effect, hydrocephalus, extra-axial fluid collection, midline shift, hemorrhage, or acute infarction.  The visualized bony calvarium is unremarkable.  IMPRESSION: Unremarkable noncontrast head CT.   Electronically Signed   By: Margarette Canada M.D.   On: 02/22/2015 21:04   Mr Brain Wo Contrast  02/22/2015   CLINICAL DATA:  New onset headache ; 2 weeks of intermittent frontal headache, now radiating to back of head. History of diabetes, hypertension, hyperlipidemia, breast cancer.  EXAM: MRI HEAD WITHOUT CONTRAST  TECHNIQUE: Multiplanar, multiecho pulse sequences of the brain and surrounding structures were obtained without intravenous contrast.  COMPARISON:  CT head February 22, 2015 at 2014 hours  FINDINGS: Subcentimeter focus of acute ischemia within mesial periventricular LEFT temporal lobe/hippocampus.The ventricles and sulci are normal for patient's age. No abnormal parenchymal signal, mass lesions, mass effect. A few punctate foci of T2 hyperintense signal in the supratentorial brain suggest chronic small vessel ischemic disease, less than expected for age. No susceptibility artifact to suggest hemorrhage.  No abnormal extra-axial fluid collections. No extra-axial masses though, contrast enhanced sequences would be more sensitive. Normal major intracranial vascular flow voids seen at the skull base.  Ocular globes and orbital contents are unremarkable though not tailored for evaluation. No abnormal sellar expansion. Mild paranasal sinus mucosal thickening without air-fluid levels. The mastoid air cells are well aerated. No suspicious calvarial bone marrow signal. Craniocervical junction maintained.  IMPRESSION: Subcentimeter acute infarct LEFT mesial temporal lobe/hippocampus.  Otherwise normal MRI of the brain for age.   Electronically Signed   By: Elon Alas M.D.   On: 02/22/2015 23:01   I have personally reviewed and evaluated  these images and lab results as part of my medical decision-making.   EKG Interpretation   Date/Time:  Saturday February 22 2015 23:54:17 EDT Ventricular Rate:  63 PR Interval:  237 QRS Duration: 70 QT Interval:  410 QTC Calculation: 420 R Axis:   33 Text Interpretation:  Sinus rhythm Prolonged PR interval No significant  change since last tracing Confirmed by Clifford Coudriet MD, Lorenz Donley ([id number]) on 02/23/2015  1:03:24 AM      MDM   Final diagnoses:  Acute CVA (cerebrovascular accident) ([place])    In short, this is a 64 year old female who presents with headache in the sensation of vertigo. She on presentation is nontoxic and in no acute distress. Vital signs are non-concerning. Neuro exam, she is noted to have  subtle dysmetria from finger to nose of the right upper extremity. Concerning for central cause of her symptoms. Initial CT head is negative, but follow-up MRI notable for acute left mesial temporal lobe/hippocampus infarct. Discussed with neurology, with recommendations pending. Admitted to the hospital service for ongoing management.  Forde Dandy, MD 02/23/15 [phone]

## 2015-02-22 NOTE — ED Notes (Signed)
She is also c/o some intgermittent dizziness since her headache started

## 2015-02-22 NOTE — ED Notes (Signed)
Pt hooked back up to the monitor and she requested a ginger ale. RN Lennette Bihari ok pt given a ginger ale

## 2015-02-22 NOTE — ED Notes (Addendum)
Patient complains of HA and dizziness x2 weeks. HA is an aching pain that is now in her neck. Saw PCP yesterday who prescribed fluticasone 50 mcg nasal spray and Depomedrol inj. 40 mg For Sinus URI. Patient reports a productive cough with clear sputum.  Denies taking anything for HA today, denies fevers.  Has had nausea x1 week. Denies vomiting.

## 2015-02-22 NOTE — Consult Note (Signed)
Stroke Consult    Chief Complaint: headache HPI: Jeanette [name] is an 64 y.o. female hx of HTN, HLD, DM presenting with 2 weeks of intermittent headache. Has associated dizziness and gait instability. Dizziness described as a sensation that the room is moving. Headaches described as a generalized pulsating type pain that comes and goes. Notes associated nausea, emesis, photo and phonophobia. Denies any visual changes. No jaw pain or fatigue. Notes recently starting on a nasal spray for allergies. Has been taking tylenol for her headache.  MRI brain imaging reviewed, shows a small acute incarct in the left mesial temporal lobe/hippocampus. BP noted to be elevated in ED, highest noted 180/80.   Date last known well: unclear Time last known well: unclear tPA Given: no, unclear last seen well Modified Rankin: Rankin Score=0  Past Medical History  Diagnosis Date  . Hypertension   . Hyperlipidemia   . Neuromuscular disorder (HCC)     neuropathy hands and feet  . Heart murmur     many years ago  . Pneumonia ~ 2010  . Type II diabetes mellitus ([place])   . Hypothyroidism     thyroidectomy 07/18/2014  . Arthritis     "back" (07/18/2014)  . Chronic back pain   . Anemia   . Cancer of left breast [facility])     Past Surgical History  Procedure Laterality Date  . Abdominal hysterectomy  1970's  . Tonsillectomy    . Bilateral oophorectomy  1980j's  . Bunionectomy Right   . Cardiac catheterization  02/16/2011    NL coronares, EF 65%, no AS or MR (Dr. Corliss Parish; [place])  . Total thyroidectomy  07/18/2014  . Appendectomy    . Carpal tunnel release Bilateral   . Mastectomy Left 2008  . Breast biopsy Left 2008  . Thyroidectomy N/A 07/18/2014    Procedure: TOTAL THYROIDECTOMY;  Surgeon: Armandina Gemma, MD;  Location: [place];  Service: General;  Laterality: N/A;    No family history on file. Social History:  reports that she quit smoking about 44 years ago. Her smoking use included  Cigarettes. She has a 1.25 pack-year smoking history. She has never used smokeless tobacco. She reports that she drinks alcohol. She reports that she uses illicit drugs (Marijuana).  Allergies: No Known Allergies   (Not in a hospital admission)  ROS: Out of a complete 14 system review, the patient complains of only the following symptoms, and all other reviewed systems are negative. +headache  Physical Examination: Filed Vitals:   02/22/15 2100  BP: 169/68  Pulse: 65  Temp:   Resp:    Physical Exam  Constitutional: He appears well-developed and well-nourished.  Psych: Affect appropriate to situation Eyes: No scleral injection HENT: No OP obstrucion Head: Normocephalic. No temporal tenderness Cardiovascular: Normal rate and regular rhythm.  Respiratory: Effort normal and breath sounds normal.  GI: Soft. Bowel sounds are normal. No distension. There is no tenderness.  Skin: WDI  Neurologic Examination: Mental Status: Alert, oriented, thought content appropriate.  Speech fluent without evidence of aphasia. No dysarthria.  Able to follow 3 step commands without difficulty. Cranial Nerves: II: optic discs not visualized, visual fields grossly normal, pupils equal, round, reactive to light and accommodation III,IV, VI: ptosis not present, extra-ocular motions intact bilaterally V,VII: smile symmetric, facial light touch sensation normal bilaterally VIII: hearing normal bilaterally IX,X: gag reflex present XI: trapezius strength/neck flexion strength normal bilaterally XII: tongue strength normal  Motor: Right : Upper extremity  Left:     Upper extremity 5/5 deltoid       5/5 deltoid 5/5 biceps      5/5 biceps  5/5 triceps      5/5 triceps 5/5 hand grip      5/5 hand grip  Lower extremity     Lower extremity 5/5 hip flexor      5/5 hip flexor 5/5 quadricep      5/5 quadriceps  5/5 hamstrings     5/5 hamstrings 5/5 plantar flexion       5/5 plantar flexion 5/5  plantar extension     5/5 plantar extension Tone and bulk:normal tone throughout; no atrophy noted Sensory: Pinprick and light touch intact throughout, bilaterally Deep Tendon Reflexes: 2+ and symmetric throughout Plantars: Right: downgoing   Left: downgoing Cerebellar: normal finger-to-nose, normal rapid alternating movements and normal heel-to-shin test Gait: deferred  Laboratory Studies:   Basic Metabolic Panel:  Recent Labs Lab 02/22/15 2003  NA 140  K 5.0  CL 107  CO2 25  GLUCOSE 94  BUN 14  CREATININE 1.22*  CALCIUM 9.4    Liver Function Tests: No results for input(s): AST, ALT, ALKPHOS, BILITOT, PROT, ALBUMIN in the last 168 hours. No results for input(s): LIPASE, AMYLASE in the last 168 hours. No results for input(s): AMMONIA in the last 168 hours.  CBC:  Recent Labs Lab 02/22/15 2003  WBC 4.9  NEUTROABS 2.3  HGB 11.6*  HCT 36.7  MCV 83.4  PLT PLATELET CLUMPS NOTED ON SMEAR, UNABLE TO ESTIMATE    Cardiac Enzymes: No results for input(s): CKTOTAL, CKMB, CKMBINDEX, TROPONINI in the last 168 hours.  BNP: Invalid input(s): POCBNP  CBG: No results for input(s): GLUCAP in the last 168 hours.  Microbiology: No results found for this or any previous visit.  Coagulation Studies: No results for input(s): LABPROT, INR in the last 72 hours.  Urinalysis: No results for input(s): COLORURINE, LABSPEC, PHURINE, GLUCOSEU, HGBUR, BILIRUBINUR, KETONESUR, PROTEINUR, UROBILINOGEN, NITRITE, LEUKOCYTESUR in the last 168 hours.  Invalid input(s): APPERANCEUR  Lipid Panel:  No results found for: CHOL, TRIG, HDL, CHOLHDL, VLDL, LDLCALC  HgbA1C: No results found for: HGBA1C  Urine Drug Screen:  No results found for: LABOPIA, COCAINSCRNUR, LABBENZ, AMPHETMU, THCU, LABBARB  Alcohol Level: No results for input(s): ETH in the last 168 hours.  Other results:  Imaging: Dg Chest 2 View  02/22/2015   CLINICAL DATA:  Headache. Gait instability for 2 weeks. Hypertension.  Diabetes. Ex-smoker.  EXAM: CHEST  2 VIEW  COMPARISON:  04/05/2014  FINDINGS: Midline trachea. Normal heart size and mediastinal contours. No pleural effusion or pneumothorax. Surgical changes of left mastectomy and axillary node dissection. Clear right lung. A left mid lung granuloma causes increased density projecting over the scapular tip.  IMPRESSION: No acute cardiopulmonary disease.   Electronically Signed   By: Abigail Miyamoto M.D.   On: 02/22/2015 21:08   Ct Head Wo Contrast  02/22/2015   CLINICAL DATA:  Acute headache and dizziness for 2 weeks.  EXAM: CT HEAD WITHOUT CONTRAST  TECHNIQUE: Contiguous axial images were obtained from the base of the skull through the vertex without intravenous contrast.  COMPARISON:  None.  FINDINGS: No intracranial abnormalities are identified, including mass lesion or mass effect, hydrocephalus, extra-axial fluid collection, midline shift, hemorrhage, or acute infarction.  The visualized bony calvarium is unremarkable.  IMPRESSION: Unremarkable noncontrast head CT.   Electronically Signed   By: Margarette Canada M.D.   On: 02/22/2015 21:04  Mr Brain Wo Contrast  02/22/2015   CLINICAL DATA:  New onset headache ; 2 weeks of intermittent frontal headache, now radiating to back of head. History of diabetes, hypertension, hyperlipidemia, breast cancer.  EXAM: MRI HEAD WITHOUT CONTRAST  TECHNIQUE: Multiplanar, multiecho pulse sequences of the brain and surrounding structures were obtained without intravenous contrast.  COMPARISON:  CT head February 22, 2015 at 2014 hours  FINDINGS: Subcentimeter focus of acute ischemia within mesial periventricular LEFT temporal lobe/hippocampus.The ventricles and sulci are normal for patient's age. No abnormal parenchymal signal, mass lesions, mass effect. A few punctate foci of T2 hyperintense signal in the supratentorial brain suggest chronic small vessel ischemic disease, less than expected for age. No susceptibility artifact to suggest hemorrhage.   No abnormal extra-axial fluid collections. No extra-axial masses though, contrast enhanced sequences would be more sensitive. Normal major intracranial vascular flow voids seen at the skull base.  Ocular globes and orbital contents are unremarkable though not tailored for evaluation. No abnormal sellar expansion. Mild paranasal sinus mucosal thickening without air-fluid levels. The mastoid air cells are well aerated. No suspicious calvarial bone marrow signal. Craniocervical junction maintained.  IMPRESSION: Subcentimeter acute infarct LEFT mesial temporal lobe/hippocampus.  Otherwise normal MRI of the brain for age.   Electronically Signed   By: Elon Alas M.D.   On: 02/22/2015 23:01    Assessment: 64 y.o. female hx of HTN, HLD, DM presenting with recurrent headache, gait instability and dizziness/vertigo for the past 2 weeks. Currently asymptomatic with normal neurological exam. MRI brain shows small acute infarct in the left temporal lobe. Unclear etiology of headache and vertigo as stroke location would not explain this. Headache may be related to poorly controlled BP vs sinus/allergies. Will need to be admitted for stroke workup.   Plan: 1. HgbA1c, fasting lipid panel 2. MRA  of the brain without contrast 3. PT consult, OT consult, Speech consult 4. Echocardiogram 5. Carotid dopplers 6. Prophylactic therapy-Plavix 75mg  daily (took ASA 325mg  at home) 7. Risk factor modification 8. Telemetry monitoring 9. Frequent neuro checks 10. NPO until RN stroke swallow screen   Jim Like, DO [facility] [phone]  If 7pm- 7am, please page neurology on call as listed in [place]. 02/22/2015, 11:46 PM

## 2015-02-22 NOTE — ED Notes (Signed)
The pt has had a headache for 2 weeks and herf allergies were acting up  She saw her doctor yesterday and she was given some cough pills.  She is no better

## 2015-02-22 NOTE — ED Notes (Signed)
Admitting at bedside 

## 2015-02-23 ENCOUNTER — Encounter (HOSPITAL_COMMUNITY): Payer: Self-pay | Admitting: *Deleted

## 2015-02-23 ENCOUNTER — Inpatient Hospital Stay (HOSPITAL_COMMUNITY): Payer: Medicare Other

## 2015-02-23 DIAGNOSIS — I10 Essential (primary) hypertension: Secondary | ICD-10-CM

## 2015-02-23 DIAGNOSIS — E785 Hyperlipidemia, unspecified: Secondary | ICD-10-CM

## 2015-02-23 DIAGNOSIS — I5032 Chronic diastolic (congestive) heart failure: Secondary | ICD-10-CM

## 2015-02-23 DIAGNOSIS — N183 Chronic kidney disease, stage 3 (moderate): Secondary | ICD-10-CM

## 2015-02-23 DIAGNOSIS — E1122 Type 2 diabetes mellitus with diabetic chronic kidney disease: Secondary | ICD-10-CM

## 2015-02-23 DIAGNOSIS — E890 Postprocedural hypothyroidism: Secondary | ICD-10-CM

## 2015-02-23 DIAGNOSIS — I639 Cerebral infarction, unspecified: Secondary | ICD-10-CM

## 2015-02-23 DIAGNOSIS — I6789 Other cerebrovascular disease: Secondary | ICD-10-CM

## 2015-02-23 LAB — GLUCOSE, CAPILLARY
GLUCOSE-CAPILLARY: 71 mg/dL (ref 65–99)
GLUCOSE-CAPILLARY: 84 mg/dL (ref 65–99)
Glucose-Capillary: 145 mg/dL — ABNORMAL HIGH (ref 65–99)
Glucose-Capillary: 145 mg/dL — ABNORMAL HIGH (ref 65–99)
Glucose-Capillary: 87 mg/dL (ref 65–99)

## 2015-02-23 LAB — SEDIMENTATION RATE: SED RATE: 9 mm/h (ref 0–22)

## 2015-02-23 LAB — LIPID PANEL
Cholesterol: 143 mg/dL (ref 0–200)
HDL: 80 mg/dL (ref >40)
LDL CALC: 56 mg/dL (ref 0–99)
TRIGLYCERIDES: 34 mg/dL (ref <150)
Total CHOL/HDL Ratio: 1.8 RATIO
VLDL: 7 mg/dL (ref 0–40)

## 2015-02-23 MED ORDER — METHYLPREDNISOLONE 4 MG PO TBPK
8.0000 mg | ORAL_TABLET | Freq: Every evening | ORAL | Status: AC
  Administered 2015-02-24: 8 mg via ORAL

## 2015-02-23 MED ORDER — METHYLPREDNISOLONE 4 MG PO TBPK
4.0000 mg | ORAL_TABLET | ORAL | Status: AC
  Administered 2015-02-23: 4 mg via ORAL

## 2015-02-23 MED ORDER — CLOPIDOGREL BISULFATE 75 MG PO TABS: 75.0000 mg | ORAL_TABLET | Freq: Every day | ORAL | Status: DC

## 2015-02-23 MED ORDER — TRAMADOL HCL 50 MG PO TABS
50.0000 mg | ORAL_TABLET | Freq: Every day | ORAL | Status: DC | PRN
  Administered 2015-02-23 (×2): 50 mg via ORAL
  Filled 2015-02-23 (×2): qty 1

## 2015-02-23 MED ORDER — ASPIRIN 325 MG PO TABS
325.0000 mg | ORAL_TABLET | Freq: Every day | ORAL | Status: DC
  Filled 2015-02-23: qty 1

## 2015-02-23 MED ORDER — GABAPENTIN 300 MG PO CAPS
300.0000 mg | ORAL_CAPSULE | Freq: Three times a day (TID) | ORAL | Status: DC
Start: 2015-02-23 — End: 2015-02-25
  Administered 2015-02-23 – 2015-02-25 (×7): 300 mg via ORAL
  Filled 2015-02-23 (×7): qty 1

## 2015-02-23 MED ORDER — CALCIUM CARBONATE-VITAMIN D 500-200 MG-UNIT PO TABS
2.0000 | ORAL_TABLET | Freq: Two times a day (BID) | ORAL | Status: DC
Start: 2015-02-23 — End: 2015-02-25
  Administered 2015-02-23 – 2015-02-25 (×5): 2 via ORAL
  Filled 2015-02-23: qty 2
  Filled 2015-02-23 (×2): qty 1
  Filled 2015-02-23 (×2): qty 2
  Filled 2015-02-23: qty 1

## 2015-02-23 MED ORDER — SENNOSIDES-DOCUSATE SODIUM 8.6-50 MG PO TABS
1.0000 | ORAL_TABLET | Freq: Every evening | ORAL | Status: DC | PRN
  Administered 2015-02-24 – 2015-02-25 (×2): 1 via ORAL
  Filled 2015-02-23 (×2): qty 1

## 2015-02-23 MED ORDER — LOSARTAN POTASSIUM 50 MG PO TABS
100.0000 mg | ORAL_TABLET | Freq: Every day | ORAL | Status: DC
  Administered 2015-02-23 – 2015-02-25 (×3): 100 mg via ORAL
  Filled 2015-02-23 (×3): qty 2

## 2015-02-23 MED ORDER — METFORMIN HCL 500 MG PO TABS
1000.0000 mg | ORAL_TABLET | Freq: Two times a day (BID) | ORAL | Status: DC
  Administered 2015-02-23 – 2015-02-25 (×5): 1000 mg via ORAL
  Filled 2015-02-23 (×5): qty 2

## 2015-02-23 MED ORDER — ACETAMINOPHEN 325 MG PO TABS
650.0000 mg | ORAL_TABLET | ORAL | Status: DC | PRN
  Administered 2015-02-23: 650 mg via ORAL
  Filled 2015-02-23: qty 2

## 2015-02-23 MED ORDER — PIOGLITAZONE HCL 45 MG PO TABS
45.0000 mg | ORAL_TABLET | Freq: Every day | ORAL | Status: DC
  Administered 2015-02-23 – 2015-02-25 (×3): 45 mg via ORAL
  Filled 2015-02-23 (×3): qty 1

## 2015-02-23 MED ORDER — ACETAMINOPHEN 650 MG RE SUPP: 650.0000 mg | RECTAL | Status: DC | PRN

## 2015-02-23 MED ORDER — TOPIRAMATE 25 MG PO TABS
25.0000 mg | ORAL_TABLET | Freq: Two times a day (BID) | ORAL | Status: DC
  Administered 2015-02-23 – 2015-02-24 (×3): 25 mg via ORAL
  Filled 2015-02-23 (×5): qty 1

## 2015-02-23 MED ORDER — FLUTICASONE PROPIONATE 50 MCG/ACT NA SUSP
2.0000 | Freq: Every day | NASAL | Status: DC
  Administered 2015-02-23 – 2015-02-25 (×3): 2 via NASAL
  Filled 2015-02-23: qty 16

## 2015-02-23 MED ORDER — LEVOTHYROXINE SODIUM 88 MCG PO TABS
88.0000 ug | ORAL_TABLET | Freq: Every day | ORAL | Status: DC
  Administered 2015-02-23 – 2015-02-25 (×3): 88 ug via ORAL
  Filled 2015-02-23 (×3): qty 1

## 2015-02-23 MED ORDER — METHYLPREDNISOLONE 4 MG PO TBPK
4.0000 mg | ORAL_TABLET | Freq: Three times a day (TID) | ORAL | Status: AC
  Administered 2015-02-24 (×3): 4 mg via ORAL

## 2015-02-23 MED ORDER — STROKE: EARLY STAGES OF RECOVERY BOOK
Freq: Once | Status: AC
  Administered 2015-02-23: 1

## 2015-02-23 MED ORDER — ENOXAPARIN SODIUM 40 MG/0.4ML ~~LOC~~ SOLN
40.0000 mg | SUBCUTANEOUS | Status: DC
  Administered 2015-02-23 – 2015-02-24 (×2): 40 mg via SUBCUTANEOUS
  Filled 2015-02-23 (×3): qty 0.4

## 2015-02-23 MED ORDER — CLOPIDOGREL BISULFATE 75 MG PO TABS
75.0000 mg | ORAL_TABLET | Freq: Every day | ORAL | Status: DC
  Administered 2015-02-23 – 2015-02-25 (×3): 75 mg via ORAL
  Filled 2015-02-23 (×3): qty 1

## 2015-02-23 MED ORDER — METHYLPREDNISOLONE 4 MG PO TBPK
8.0000 mg | ORAL_TABLET | Freq: Every morning | ORAL | Status: AC
  Administered 2015-02-23: 8 mg via ORAL
  Filled 2015-02-23: qty 21

## 2015-02-23 MED ORDER — ATORVASTATIN CALCIUM 40 MG PO TABS
40.0000 mg | ORAL_TABLET | Freq: Every morning | ORAL | Status: DC
  Administered 2015-02-23 – 2015-02-25 (×3): 40 mg via ORAL
  Filled 2015-02-23 (×3): qty 1

## 2015-02-23 MED ORDER — TRAMADOL HCL 50 MG PO TABS
50.0000 mg | ORAL_TABLET | Freq: Four times a day (QID) | ORAL | Status: DC | PRN
  Administered 2015-02-24: 50 mg via ORAL
  Filled 2015-02-23: qty 1

## 2015-02-23 MED ORDER — HYDROCHLOROTHIAZIDE 25 MG PO TABS
12.5000 mg | ORAL_TABLET | Freq: Every day | ORAL | Status: DC
  Administered 2015-02-23 – 2015-02-25 (×3): 12.5 mg via ORAL
  Filled 2015-02-23 (×3): qty 1

## 2015-02-23 MED ORDER — METHYLPREDNISOLONE 4 MG PO TBPK
4.0000 mg | ORAL_TABLET | Freq: Four times a day (QID) | ORAL | Status: DC
  Administered 2015-02-25: 4 mg via ORAL

## 2015-02-23 MED ORDER — METHYLPREDNISOLONE 4 MG PO TBPK
8.0000 mg | ORAL_TABLET | Freq: Every evening | ORAL | Status: AC
Start: 2015-02-23 — End: 2015-02-23
  Administered 2015-02-23: 8 mg via ORAL

## 2015-02-23 NOTE — H&P (Signed)
History and Physical  KAMYAH WILHELMSEN  ZOX:[id number]  DOB: 04/17/1951  DOA: 02/22/2015  Referring physician: Brantley Stage, MD PCP: Bartholome Bill, MD   Chief Complaint: "I've been having dizzy spells for the last week, getting worse, and now had a headache."  HPI: Akari Defelice Ciolek is a 64 y.o. female with a past medical history significant for hypertension, hyperlipidemia, NIDDM, breast cancer stage II a status post adjuvant chemotherapy, radiation, and hormone therapy, and status post thyroidectomy, with chronic anemia who presents with 2 weeks worsening vertigo and gait instability.  The patient was in her usual state of health until about 2 weeks ago when she started to have "dizzy spells."   these would last for hours and would only go away when she laid down and close her eyes to go to sleep. They're associated with nausea, and difficulty walking straight. She is also noticed in the last few days that she has trouble expressing some sentences. Today she had a right-sided headache and so she presented to the emergency room.  In the ED, she was afebrile and hemodynamically stable, with a normal EKG. She was noted to be ataxic, a head CT was normal, but an MRI showed a subcentimeter left temporal lobe infarct. The case was discussed with neurology who recommended admission for stroke workup.    Review of Systems:  Patient seen 12:03 AM on 02/23/2015. Pt complains of gait instability, headache, vertigo lasting hours, nausea, hair thinning. Pt denie emesis, focal weakness, slurred speech, seizures, passing out, confusion.   Otherwise 12 systems were reviewed and were negative except as just noted or noted in the history of present illness.  Past Medical History  Diagnosis Date  . Hypertension   . Hyperlipidemia   . Neuromuscular disorder (HCC)     neuropathy hands and feet  . Heart murmur     many years ago  . Pneumonia ~ 2010  . Type II diabetes mellitus ([place])   .  Hypothyroidism     thyroidectomy 07/18/2014  . Arthritis     "back" (07/18/2014)  . Chronic back pain   . Anemia   . Cancer of left breast ([place])   . Stroke [facility])   The above past medical history was reviewed.  Past Surgical History  Procedure Laterality Date  . Abdominal hysterectomy  1970's  . Tonsillectomy    . Bilateral oophorectomy  1980j's  . Bunionectomy Right   . Cardiac catheterization  02/16/2011    NL coronares, EF 65%, no AS or MR (Dr. Corliss Parish; [place])  . Total thyroidectomy  07/18/2014  . Appendectomy    . Carpal tunnel release Bilateral   . Mastectomy Left 2008  . Breast biopsy Left 2008  . Thyroidectomy N/A 07/18/2014    Procedure: TOTAL THYROIDECTOMY;  Surgeon: Armandina Gemma, MD;  Location: [place];  Service: General;  Laterality: N/A;  The above surgical history was reviewed.  Social History: Patient lives alone. She is from [place]. 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 She smoked many years ago. She does not drink alcohol.  No Known Allergies  Family History  Problem Relation Age of Onset  . Hypertension Father   . Diabetes Father   . Stroke Father   . Hypertension Sister   . Diabetes Sister   . Stroke Sister     Prior to Admission medications   Medication Sig Start Date End Date Taking? Authorizing Provider  aspirin  325 MG tablet Take 325 mg by mouth daily.  02/16/11  Yes Historical Provider, MD  atorvastatin (LIPITOR) 20 MG tablet Take 20 mg by mouth every morning.  02/16/11  Yes Historical Provider, MD  benzonatate (TESSALON) 200 MG capsule Take 200 mg by mouth 3 (three) times daily as needed. 02/13/15  Yes Historical Provider, MD  calcium-vitamin D (OSCAL WITH D) 500-200 MG-UNIT per tablet Take 2 tablets by mouth 2 (two) times daily. 07/19/14  Yes Armandina Gemma, MD  fluticasone (FLONASE) 50 MCG/ACT nasal spray Place 2 sprays into both nostrils daily. 02/21/15 03/23/15 Yes Historical Provider, MD    hydrochlorothiazide (HYDRODIURIL) 12.5 MG tablet Take 12.5 mg by mouth daily. 02/07/15  Yes Historical Provider, MD  losartan (COZAAR) 100 MG tablet Take 100 mg by mouth daily.  02/16/11  Yes Historical Provider, MD  metFORMIN (GLUCOPHAGE) 1000 MG tablet Take 1,000 mg by mouth 2 (two) times daily with a meal.  02/16/11  Yes Historical Provider, MD  Multiple Vitamins-Minerals (MULTIVITAMIN WITH MINERALS) tablet Take 1 tablet by mouth daily.  02/16/11  Yes Historical Provider, MD  pioglitazone (ACTOS) 45 MG tablet Take 45 mg by mouth daily.   Yes Historical Provider, MD  SYNTHROID 88 MCG tablet Take 1 tablet (88 mcg total) by mouth daily before breakfast. 07/19/14  Yes Armandina Gemma, MD  traMADol (ULTRAM) 50 MG tablet Take 50 mg by mouth daily as needed for moderate pain.  01/17/15  Yes Historical Provider, MD  gabapentin (NEURONTIN) 300 MG capsule Take 300 mg by mouth 3 (three) times daily.    Historical Provider, MD    Physical Exam: BP 178/66 mmHg  Pulse 67  Temp(Src) 98.1 F (36.7 C) (Oral)  Resp 18  Ht 5\' 3"  (1.6 m)  Wt 81.733 kg (180 lb 3 oz)  BMI 31.93 kg/m2  SpO2 98% General: Well-developed, adult female, alert and in  distress.  Responds appropriately to questions.  sitting in bed with the lights dim.  HEENT: Head normal.   Ears: Normal auditory acuity. Eyes: Sclerae normal without icterus, conjunctiva pink, lids and lashes normal.  PERRL and EOMI.   Nose: No deformity, discharge, or epistaxis.   Mouth: OP moist without erythema, exudates, cobblestoning, or ulcers.  No airway deformities.   Skin: Warm and dry.  No jaundice.  No suspicious rashes or lesions. Cardiac: RRR, nl S1-S2, no murmurs appreciated.  Capillary refill is less than 2 seconds.  No LE edema.  Radial and DP pulses 2+ and symmetric. Respiratory: Normal respiratory rate and rhythm.  CTAB without rales or wheezes. Abdomen: BS present.  Abdomen soft without rigidity.     Neuro: Sensorium intact.  Cranial nerves 3-12  intact.  Speech is fluent.  Attention and concentration are normal.  Memory seems intact.  Moves all extremities equally and with normal coordination.  She is unstable with standing with her eyes closed.  No pronator drift.  FTN misses on the right.  There is 5/5 strength of grip, proximal UE, and proximal and distal LE strength, symmetrically to my exam.  There is normal sensation to light touch. Psych: Appropriate affect.  Speech normal. Thought content/process linear/appropriate.  No evidence of aural or visual hallucinations or delusions.       Labs on Admission:  The metabolic panel is notable for normal sodium, potassium, bicarbonate, and stable serum creatinine normal serum glucose..  The complete blood count is notable for anemia 11.6, stable from previous.   Radiological Exams on Admission: Personally reviewed: Dg Chest 2  View 02/22/2015  No focal airspace disease other than old granuloma.   Ct Head Wo Contrast 02/22/2015  Negative   Mr Brain Wo Contrast 02/22/2015   IMPRESSION: Subcentimeter acute infarct LEFT mesial temporal lobe/hippocampus.  Otherwise normal MRI of the brain for age.     EKG: Independently reviewed. Normal sinus, first-degree AVB    Assessment/Plan Principal Problem:   CVA (cerebral infarction) Active Problems:   Anemia in neoplastic disease   Diabetes ([place])   HLD (hyperlipidemia)   Essential (primary) hypertension   Hypothyroidism, postsurgical  1. Small L temporal lobe CVA:  This is new. The patient knows worsening vertigo over the last week, and presents with right-sided dysmetria and ataxia, with a subcentimeter infarct in the left temporal lobe on MRI. -Consult to neurology, appreciate recommendations -Stroke bundle -Continue home aspirin and statin -Hemoglobin A1c and lipids -PT/OT and speech -Lovenox for DVT prophylaxis -Echocardiogram -MRA of the head and neck -Telemetry  2. Hypertension:  Not at goal on admission. -Continue  HCTZ and losartan  3. Type 2 diabetes, non-insulin-dependent, complicated by neuropathy:  The patient is clinically stable, so I will continue her oral medications. -Hemoglobin A1c -Continue metformin and Actos -Continue tramadol and gabapentin for neuropathy   4. History of anemia related to chemotherapy:  Stable.  5. Hypothyroidism:  Stable -Continue levothyroxine      DVT PPx:  Lovenox Diet:  Heart healthy Consultants:  Neurology Code Status:  Full   Disposition Plan:  The appropriate admission status for this patient is INPATIENT. Inpatient status is judged to be reasonable and necessary in order to provide the required intensity of service to ensure the patient's safety. The patient's presenting symptoms, physical exam findings, and initial radiographic and laboratory data in the context of their chronic comorbidities is felt to place them at high risk for further clinical deterioration. Furthermore, it is not anticipated that the patient will be medically stable for discharge from the hospital within 2 midnights of admission. The following factors support the admission status of inpatient.   A. The patient's presenting symptoms include vertigo and ataxia and headache B. The worrisome physical exam findings include dysmetria and ataxia C. The initial radiographic and laboratory data are worrisome because of MRI findings of small L temporal infarct D. The chronic co-morbidities include HTN, DM, Breast Cancer in 2008, hypothyroidism E. Patient requires inpatient status due to high intensity of service, high risk for further deterioration and high frequency of surveillance required. F. I certify that at the point of admission it is my clinical judgment that the patient will require inpatient hospital care spanning beyond 2 midnights from the point of admission.    Edwin Dada [facility] Pager [phone]

## 2015-02-23 NOTE — Progress Notes (Signed)
Patient arrived from E.D alert and oriented X4  Cardiac monitoring initiated and called in .Patient oriented to unit and room will continue to monitor./

## 2015-02-23 NOTE — Discharge Summary (Signed)
Physician Discharge Summary  Jeanette [name] ZOX:[id number] DOB: [birth year] DOA: 02/22/2015  PCP: Bartholome Bill, MD  Admit date: 02/22/2015 Discharge date: 02/24/2015  Time spent: 60 minutes  Recommendations for Outpatient Follow-up:  1. Follow-up blood pressure 2. Follow-up A1c  Discharge Condition: Stable  Discharge Diagnoses:  Principal Problem:   Acute CVA (cerebrovascular accident) ([place]) Active Problems:   Anemia in neoplastic disease   Diabetes ([place])   HLD (hyperlipidemia)   Essential (primary) hypertension   Hypothyroidism, postsurgical   CKD stage 3 due to type 2 diabetes mellitus ([place])   History of present illness:  This is a 64 year old female with a past medical history of hypertension, hyperlipidemia, non-insulin-dependent diabetes mellitus, breast cancer stage II status post mastectomy, chemotherapy, radiation and hormonal therapy. The patient presents with a complaint of a headache which has been on and off for 2 weeks. She describes it as starting in the frontal area and then radiating to the occiput. During times of intense pain, she notices trouble with her gait and nausea. She occasionally has photophobia. Headaches usually resolve with Tylenol and on the day she presented to the ER, resolved with tramadol. She does no complaints of any sinus pain or drainage. Further workup of her headache included a head CT which was normal and an MRI that revealed a subcentimeter left temporal infarct. Neurology recommended admission for further stroke workup. Noted to have a blood pressure of 180/80 in the ER.  Hospital Course:  Acute ischemic infarct -Left temporal- asymptomatic  -Lipid panel within normal limits - 2D echo: no evidence of Thrombus- see report below -Carotid duplex shows no evidence of stenosis  -Hemoglobin A1c pending - transition from daily ASA to Plavix  Headache -Resolved with tramadol-possibly tension headaches versus in relation to  hypertension-- based on her history, do not appear to be sinus headaches -Neurology is suspecting possible migraines and she has been started on a prednisone Dosepak and has been prescribed Topamax twice a day  Hypertension -BP improved significantly -cont antihypertensives  Chronic diastolic heart failure -See echo report below - Have advised the patient that she needs to have better blood pressure control-have recommended she follow-up with her PCP in 1 week for recheck of her blood pressure and titration of medications  DM 2 - Hb A1c pending -cont Metformin & Actos  Procedures: Left ventricle: The cavity size was normal. Systolic function was normal. The estimated ejection fraction was in the range of 60% to 65%. Wall motion was normal; there were no regional wall motion abnormalities. Features are consistent with a pseudonormal left ventricular filling pattern, with concomitant abnormal relaxation and increased filling pressure (grade 2 diastolic dysfunction). - Aortic valve: There was trivial regurgitation  Carotid Duplex  - . Preliminary findings: Bilateral: No significant (1-39%) ICA stenosis. Antegrade vertebral flow.   Consultations:  Neurology  Discharge Exam: Filed Weights   02/22/15 1756 02/23/15 0045  Weight: 81.733 kg (180 lb 3 oz) 81 kg (178 lb 9.2 oz)   Filed Vitals:   02/24/15 0545  BP: 138/70  Pulse: 70  Temp: 98.1 F (36.7 C)  Resp: 20    General: AAO x 3, no distress Cardiovascular: RRR, no murmurs  Respiratory: clear to auscultation bilaterally GI: soft, non-tender, non-distended, bowel sound positive  Discharge Instructions You were cared for by a hospitalist during your hospital stay. If you have any questions about your discharge medications or the care you received while you were in the hospital after you are discharged, you can  call the unit and asked to speak with the hospitalist on call if the hospitalist that took care of  you is not available. Once you are discharged, your primary care physician will handle any further medical issues. Please note that NO REFILLS for any discharge medications will be authorized once you are discharged, as it is imperative that you return to your primary care physician (or establish a relationship with a primary care physician if you do not have one) for your aftercare needs so that they can reassess your need for medications and monitor your lab values.      Discharge Instructions    Ambulatory referral to Neurology    Complete by:  As directed   An appointment is requested in approximately: 2 months     Discharge instructions    Complete by:  As directed   Low sodium, Heart healthy, diabetic diet     Increase activity slowly    Complete by:  As directed             Medication List    STOP taking these medications        aspirin 325 MG tablet      TAKE these medications        atorvastatin 20 MG tablet  Commonly known as:  LIPITOR  Take 20 mg by mouth every morning.     benzonatate 200 MG capsule  Commonly known as:  TESSALON  Take 200 mg by mouth 3 (three) times daily as needed.     calcium-vitamin D 500-200 MG-UNIT tablet  Commonly known as:  OSCAL WITH D  Take 2 tablets by mouth 2 (two) times daily.     clopidogrel 75 MG tablet  Commonly known as:  PLAVIX  Take 1 tablet (75 mg total) by mouth daily.     fluticasone 50 MCG/ACT nasal spray  Commonly known as:  FLONASE  Place 2 sprays into both nostrils daily.     gabapentin 300 MG capsule  Commonly known as:  NEURONTIN  Take 300 mg by mouth 3 (three) times daily.     hydrochlorothiazide 12.5 MG tablet  Commonly known as:  HYDRODIURIL  Take 12.5 mg by mouth daily.     losartan 100 MG tablet  Commonly known as:  COZAAR  Take 100 mg by mouth daily.     metFORMIN 1000 MG tablet  Commonly known as:  GLUCOPHAGE  Take 1,000 mg by mouth 2 (two) times daily with a meal.     methylPREDNISolone 4 MG  Tbpk tablet  Commonly known as:  MEDROL DOSEPAK  follow package directions     multivitamin with minerals tablet  Take 1 tablet by mouth daily.     pioglitazone 45 MG tablet  Commonly known as:  ACTOS  Take 45 mg by mouth daily.     SYNTHROID 88 MCG tablet  Generic drug:  levothyroxine  Take 1 tablet (88 mcg total) by mouth daily before breakfast.     topiramate 25 MG tablet  Commonly known as:  TOPAMAX  Take 1 tablet (25 mg total) by mouth 2 (two) times daily.     traMADol 50 MG tablet  Commonly known as:  ULTRAM  Take 50 mg by mouth daily as needed for moderate pain.       No Known Allergies Follow-up Information    Follow up with SETHI,PRAMOD, MD.   Specialties:  Neurology, Radiology   Why:  They will call you with an appt date.  Contact information:   [address] [place] [address] [phone]        The results of significant diagnostics from this hospitalization (including imaging, microbiology, ancillary and laboratory) are listed below for reference.    Significant Diagnostic Studies: Dg Chest 2 View  02/22/2015   CLINICAL DATA:  Headache. Gait instability for 2 weeks. Hypertension. Diabetes. Ex-smoker.  EXAM: CHEST  2 VIEW  COMPARISON:  04/05/2014  FINDINGS: Midline trachea. Normal heart size and mediastinal contours. No pleural effusion or pneumothorax. Surgical changes of left mastectomy and axillary node dissection. Clear right lung. A left mid lung granuloma causes increased density projecting over the scapular tip.  IMPRESSION: No acute cardiopulmonary disease.   Electronically Signed   By: Abigail Miyamoto M.D.   On: 02/22/2015 21:08   Ct Head Wo Contrast  02/22/2015   CLINICAL DATA:  Acute headache and dizziness for 2 weeks.  EXAM: CT HEAD WITHOUT CONTRAST  TECHNIQUE: Contiguous axial images were obtained from the base of the skull through the vertex without intravenous contrast.  COMPARISON:  None.  FINDINGS: No intracranial abnormalities  are identified, including mass lesion or mass effect, hydrocephalus, extra-axial fluid collection, midline shift, hemorrhage, or acute infarction.  The visualized bony calvarium is unremarkable.  IMPRESSION: Unremarkable noncontrast head CT.   Electronically Signed   By: Margarette Canada M.D.   On: 02/22/2015 21:04   Mr Brain Wo Contrast  02/22/2015   CLINICAL DATA:  New onset headache ; 2 weeks of intermittent frontal headache, now radiating to back of head. History of diabetes, hypertension, hyperlipidemia, breast cancer.  EXAM: MRI HEAD WITHOUT CONTRAST  TECHNIQUE: Multiplanar, multiecho pulse sequences of the brain and surrounding structures were obtained without intravenous contrast.  COMPARISON:  CT head February 22, 2015 at 2014 hours  FINDINGS: Subcentimeter focus of acute ischemia within mesial periventricular LEFT temporal lobe/hippocampus.The ventricles and sulci are normal for patient's age. No abnormal parenchymal signal, mass lesions, mass effect. A few punctate foci of T2 hyperintense signal in the supratentorial brain suggest chronic small vessel ischemic disease, less than expected for age. No susceptibility artifact to suggest hemorrhage.  No abnormal extra-axial fluid collections. No extra-axial masses though, contrast enhanced sequences would be more sensitive. Normal major intracranial vascular flow voids seen at the skull base.  Ocular globes and orbital contents are unremarkable though not tailored for evaluation. No abnormal sellar expansion. Mild paranasal sinus mucosal thickening without air-fluid levels. The mastoid air cells are well aerated. No suspicious calvarial bone marrow signal. Craniocervical junction maintained.  IMPRESSION: Subcentimeter acute infarct LEFT mesial temporal lobe/hippocampus.  Otherwise normal MRI of the brain for age.   Electronically Signed   By: Elon Alas M.D.   On: 02/22/2015 23:01   Mr Jodene Nam Head/brain Wo Cm  02/23/2015   CLINICAL DATA:  Two week history  of intermittent headache, associated dizziness and gait instability, nausea, vomiting, photophobia. History of hypertension, hyperlipidemia, diabetes, breast cancer.  EXAM: MRA HEAD WITHOUT CONTRAST  TECHNIQUE: Angiographic images of the Circle of Willis were obtained using MRA technique without intravenous contrast.  COMPARISON:  MRI of the brain February 22, 2015  FINDINGS: Anterior circulation: Normal flow related enhancement of the included cervical, petrous, cavernous and supra clinoid internal carotid arteries. Patent anterior communicating artery. Normal flow related enhancement of the anterior and middle cerebral arteries, including more distal segments. Mild luminal irregularity of the mid to distal bilateral MCA vessels suggest tortuosity and artifact, less likely atherosclerosis.  No large vessel  occlusion, high-grade stenosis, aneurysm.  Posterior circulation: Codominant vertebral arteries. Basilar artery is patent, with normal flow related enhancement of the main branch vessels. Small bilateral posterior communicating arteries present. Normal flow related enhancement of the posterior cerebral arteries. Mild luminal irregularity of the mid to distal bilateral PCA vessels suggest tortuosity and artifact, less likely atherosclerosis.  No large vessel occlusion, high-grade stenosis, aneurysm.  IMPRESSION: No acute large vessel occlusion or high-grade stenosis.  Mild luminal irregularity of the mid to distal vessels may be artifact or, represent atherosclerosis.   Electronically Signed   By: Elon Alas M.D.   On: 02/23/2015 02:48    Microbiology: No results found for this or any previous visit (from the past 240 hour(s)).   Labs: Basic Metabolic Panel:  Recent Labs Lab 02/22/15 2003  NA 140  K 5.0  CL 107  CO2 25  GLUCOSE 94  BUN 14  CREATININE 1.22*  CALCIUM 9.4   Liver Function Tests: No results for input(s): AST, ALT, ALKPHOS, BILITOT, PROT, ALBUMIN in the last 168 hours. No  results for input(s): LIPASE, AMYLASE in the last 168 hours. No results for input(s): AMMONIA in the last 168 hours. CBC:  Recent Labs Lab 02/22/15 2003  WBC 4.9  NEUTROABS 2.3  HGB 11.6*  HCT 36.7  MCV 83.4  PLT PLATELET CLUMPS NOTED ON SMEAR, UNABLE TO ESTIMATE   Cardiac Enzymes: No results for input(s): CKTOTAL, CKMB, CKMBINDEX, TROPONINI in the last 168 hours. BNP: BNP (last 3 results) No results for input(s): BNP in the last 8760 hours.  ProBNP (last 3 results) No results for input(s): PROBNP in the last 8760 hours.  CBG:  Recent Labs Lab 02/23/15 1159 02/23/15 1642 02/23/15 2150 02/24/15 0624 02/24/15 1201  GLUCAP 84 87 145* 138* 100*       SignedDebbe Odea, MD [facility] 02/24/2015, 1:03 PM

## 2015-02-23 NOTE — Progress Notes (Signed)
  Echocardiogram 2D Echocardiogram has been performed.  Jennette Dubin 02/23/2015, 1:56 PM

## 2015-02-23 NOTE — Progress Notes (Signed)
[facility] Progress Note   Jeanette [name]  AJG:[id number]  DOB: 1950/08/28  DOA: 02/22/2015 PCP: Bartholome Bill, MD  Brief narrative: Jeanette [name] is a 64 y.o. female with a past medical history of hypertension, hyperlipidemia, non-insulin-dependent diabetes mellitus, breast cancer stage II status post mastectomy, chemotherapy, radiation and hormonal therapy. The patient presents with a complaint of a headache which has been on and off for 2 weeks. She describes it as starting in the frontal area and then radiating to the occiput. During times of intense pain, she notices trouble with her gait and nausea. She occasionally has photophobia. Headaches usually resolve with Tylenol and on the day she presented to the ER, resolved with tramadol. She does no complaints of any sinus pain or drainage. Further workup of her headache included a head CT which was normal and an MRI that revealed a subcentimeter left temporal infarct. Neurology recommended admission for further stroke workup. Noted to have a blood pressure of 180/80 in the ER.   Subjective: Currently without a headache. No dizziness, focal numbness or weakness, nausea, vomiting, dyspnea or chest pain.   Assessment/Plan: Principal Problem:   CVA (cerebral infarction) Left temporal- asymptomatic -Lipid panel within normal limits - 2D echo and Carotid duplex pending -Hemoglobin A1c pending - transition from daily ASA to Plavix - Neuro recommending TEE, ANA, ESR, Lymes titer, ACE  Headaches -Resolved with tramadol-possibly tension headaches versus in relation to hypertension-- based on her history, do not appear to be sinus headaches --Continue Tylenol or tramadol- above work up ordered by neuro- started on a Medrol Dosepak by Neuro  Hypertension -BP improved significantly  DM - Hb A1c pending -cont Metformin & Actos    Code Status:     Code Status Orders        Start     Ordered   02/23/15 0054   Full code   Continuous     02/23/15 0053     Family Communication:  Disposition Plan: per neuro DVT prophylaxis: Lovenox Consultants:neuro- stroke team  Antibiotics: Anti-infectives    None      Objective: Filed Weights   02/22/15 1756 02/23/15 0045  Weight: 81.733 kg (180 lb 3 oz) 81 kg (178 lb 9.2 oz)    Intake/Output Summary (Last 24 hours) at 02/23/15 1601 Last data filed at 02/23/15 0851  Gross per 24 hour  Intake    840 ml  Output      0 ml  Net    840 ml     Vitals Filed Vitals:   02/23/15 0200 02/23/15 0400 02/23/15 0600 02/23/15 1521  BP: 133/63 117/58 119/60 125/77  Pulse:    76  Temp: 98 F (36.7 C) 98 F (36.7 C) 98 F (36.7 C) 98.3 F (36.8 C)  TempSrc: Oral Oral Oral Oral  Resp: $Remo'18 16 16 16  'uIPvV$ Height:      Weight:      SpO2: 100% 97% 97% 100%    Exam:  General:  Pt is alert, not in acute distress  HEENT: No icterus, No thrush, oral mucosa moist  Cardiovascular: regular rate and rhythm, S1/S2 No murmur  Respiratory: clear to auscultation bilaterally   Abdomen: Soft, +Bowel sounds, non tender, non distended, no guarding  MSK: No LE edema, cyanosis or clubbing  Data Reviewed: Basic Metabolic Panel:  Recent Labs Lab 02/22/15 2003  NA 140  K 5.0  CL 107  CO2 25  GLUCOSE 94  BUN 14  CREATININE 1.22*  CALCIUM  9.4   Liver Function Tests: No results for input(s): AST, ALT, ALKPHOS, BILITOT, PROT, ALBUMIN in the last 168 hours. No results for input(s): LIPASE, AMYLASE in the last 168 hours. No results for input(s): AMMONIA in the last 168 hours. CBC:  Recent Labs Lab 02/22/15 2003  WBC 4.9  NEUTROABS 2.3  HGB 11.6*  HCT 36.7  MCV 83.4  PLT PLATELET CLUMPS NOTED ON SMEAR, UNABLE TO ESTIMATE   Cardiac Enzymes: No results for input(s): CKTOTAL, CKMB, CKMBINDEX, TROPONINI in the last 168 hours. BNP (last 3 results) No results for input(s): BNP in the last 8760 hours.  ProBNP (last 3 results) No results for input(s): PROBNP  in the last 8760 hours.  CBG:  Recent Labs Lab 02/23/15 0044 02/23/15 0627 02/23/15 1159  GLUCAP 145* 71 84    No results found for this or any previous visit (from the past 240 hour(s)).   Studies: Dg Chest 2 View  02/22/2015   CLINICAL DATA:  Headache. Gait instability for 2 weeks. Hypertension. Diabetes. Ex-smoker.  EXAM: CHEST  2 VIEW  COMPARISON:  04/05/2014  FINDINGS: Midline trachea. Normal heart size and mediastinal contours. No pleural effusion or pneumothorax. Surgical changes of left mastectomy and axillary node dissection. Clear right lung. A left mid lung granuloma causes increased density projecting over the scapular tip.  IMPRESSION: No acute cardiopulmonary disease.   Electronically Signed   By: Abigail Miyamoto M.D.   On: 02/22/2015 21:08   Ct Head Wo Contrast  02/22/2015   CLINICAL DATA:  Acute headache and dizziness for 2 weeks.  EXAM: CT HEAD WITHOUT CONTRAST  TECHNIQUE: Contiguous axial images were obtained from the base of the skull through the vertex without intravenous contrast.  COMPARISON:  None.  FINDINGS: No intracranial abnormalities are identified, including mass lesion or mass effect, hydrocephalus, extra-axial fluid collection, midline shift, hemorrhage, or acute infarction.  The visualized bony calvarium is unremarkable.  IMPRESSION: Unremarkable noncontrast head CT.   Electronically Signed   By: Margarette Canada M.D.   On: 02/22/2015 21:04   Mr Brain Wo Contrast  02/22/2015   CLINICAL DATA:  New onset headache ; 2 weeks of intermittent frontal headache, now radiating to back of head. History of diabetes, hypertension, hyperlipidemia, breast cancer.  EXAM: MRI HEAD WITHOUT CONTRAST  TECHNIQUE: Multiplanar, multiecho pulse sequences of the brain and surrounding structures were obtained without intravenous contrast.  COMPARISON:  CT head February 22, 2015 at 2014 hours  FINDINGS: Subcentimeter focus of acute ischemia within mesial periventricular LEFT temporal  lobe/hippocampus.The ventricles and sulci are normal for patient's age. No abnormal parenchymal signal, mass lesions, mass effect. A few punctate foci of T2 hyperintense signal in the supratentorial brain suggest chronic small vessel ischemic disease, less than expected for age. No susceptibility artifact to suggest hemorrhage.  No abnormal extra-axial fluid collections. No extra-axial masses though, contrast enhanced sequences would be more sensitive. Normal major intracranial vascular flow voids seen at the skull base.  Ocular globes and orbital contents are unremarkable though not tailored for evaluation. No abnormal sellar expansion. Mild paranasal sinus mucosal thickening without air-fluid levels. The mastoid air cells are well aerated. No suspicious calvarial bone marrow signal. Craniocervical junction maintained.  IMPRESSION: Subcentimeter acute infarct LEFT mesial temporal lobe/hippocampus.  Otherwise normal MRI of the brain for age.   Electronically Signed   By: Elon Alas M.D.   On: 02/22/2015 23:01   Mr Jodene Nam Head/brain Wo Cm  02/23/2015   CLINICAL DATA:  Two week history  of intermittent headache, associated dizziness and gait instability, nausea, vomiting, photophobia. History of hypertension, hyperlipidemia, diabetes, breast cancer.  EXAM: MRA HEAD WITHOUT CONTRAST  TECHNIQUE: Angiographic images of the Circle of Willis were obtained using MRA technique without intravenous contrast.  COMPARISON:  MRI of the brain February 22, 2015  FINDINGS: Anterior circulation: Normal flow related enhancement of the included cervical, petrous, cavernous and supra clinoid internal carotid arteries. Patent anterior communicating artery. Normal flow related enhancement of the anterior and middle cerebral arteries, including more distal segments. Mild luminal irregularity of the mid to distal bilateral MCA vessels suggest tortuosity and artifact, less likely atherosclerosis.  No large vessel occlusion, high-grade  stenosis, aneurysm.  Posterior circulation: Codominant vertebral arteries. Basilar artery is patent, with normal flow related enhancement of the main branch vessels. Small bilateral posterior communicating arteries present. Normal flow related enhancement of the posterior cerebral arteries. Mild luminal irregularity of the mid to distal bilateral PCA vessels suggest tortuosity and artifact, less likely atherosclerosis.  No large vessel occlusion, high-grade stenosis, aneurysm.  IMPRESSION: No acute large vessel occlusion or high-grade stenosis.  Mild luminal irregularity of the mid to distal vessels may be artifact or, represent atherosclerosis.   Electronically Signed   By: Elon Alas M.D.   On: 02/23/2015 02:48    Scheduled Meds:  Scheduled Meds: . atorvastatin  40 mg Oral q morning - 10a  . calcium-vitamin D  2 tablet Oral BID  . clopidogrel  75 mg Oral Daily  . enoxaparin (LOVENOX) injection  40 mg Subcutaneous Q24H  . fluticasone  2 spray Each Nare Daily  . gabapentin  300 mg Oral TID  . hydrochlorothiazide  12.5 mg Oral Daily  . levothyroxine  88 mcg Oral QAC breakfast  . losartan  100 mg Oral Daily  . metFORMIN  1,000 mg Oral BID WC  . methylPREDNISolone  4 mg Oral PC lunch  . methylPREDNISolone  4 mg Oral PC supper  . [START ON 02/24/2015] methylPREDNISolone  4 mg Oral 3 x daily with food  . [START ON 02/25/2015] methylPREDNISolone  4 mg Oral 4X daily taper  . methylPREDNISolone  8 mg Oral AC breakfast  . methylPREDNISolone  8 mg Oral Nightly  . [START ON 02/24/2015] methylPREDNISolone  8 mg Oral Nightly  . pioglitazone  45 mg Oral Daily  . topiramate  25 mg Oral BID   Continuous Infusions:   Time spent on care of this patient: 35 min   [place], MD 02/23/2015, 4:01 PM  LOS: 1 day   [facility] Office  [phone] Pager - Text Page per [email or web address] If 7PM-7AM, please contact night-coverage [email or web address]

## 2015-02-23 NOTE — Progress Notes (Signed)
Utilization Review Completed.Jeanette [name] T10/06/2014  

## 2015-02-23 NOTE — Progress Notes (Signed)
STROKE TEAM PROGRESS NOTE  HPI Jeanette [name] is an 64 y.o. female hx of HTN, HLD, DM presenting with 2 weeks of intermittent headache. Has associated dizziness and gait instability. Dizziness described as a sensation that the room is moving. Headaches described as a generalized pulsating type pain that comes and goes. Notes associated nausea, emesis, photo and phonophobia. Denies any visual changes. No jaw pain or fatigue. Notes recently starting on a nasal spray for allergies. Has been taking tylenol for her headache.  MRI brain imaging reviewed, shows a small acute incarct in the left mesial temporal lobe/hippocampus. BP noted to be elevated in ED, highest noted 180/80.   Date last known well: unclear Time last known well: unclear tPA Given: no, unclear last seen well Modified Rankin: Rankin Score=0    SUBJECTIVE (INTERVAL HISTORY) No family members present. The patient noted recent headaches and dizziness. She has no previous history of migraine headaches or headaches in general. She describes her headaches as sharp at times and occasionally associated with nausea. She has taken Tylenol without relief.   OBJECTIVE Temp:  [97.8 F (36.6 C)-98.1 F (36.7 C)] 98 F (36.7 C) (10/02 0600) Pulse Rate:  [60-77] 74 (10/02 0045) Cardiac Rhythm:  [-] Normal sinus rhythm (10/02 0050) Resp:  [11-18] 16 (10/02 0600) BP: (117-180)/(58-87) 119/60 mmHg (10/02 0600) SpO2:  [97 %-100 %] 97 % (10/02 0600) Weight:  [81 kg (178 lb 9.2 oz)-81.733 kg (180 lb 3 oz)] 81 kg (178 lb 9.2 oz) (10/02 0045)  CBC:  Recent Labs Lab 02/22/15 2003  WBC 4.9  NEUTROABS 2.3  HGB 11.6*  HCT 36.7  MCV 83.4  PLT PLATELET CLUMPS NOTED ON SMEAR, UNABLE TO ESTIMATE    Basic Metabolic Panel:  Recent Labs Lab 02/22/15 2003  NA 140  K 5.0  CL 107  CO2 25  GLUCOSE 94  BUN 14  CREATININE 1.22*  CALCIUM 9.4    Lipid Panel: No results found for: CHOL, TRIG, HDL, CHOLHDL, VLDL, LDLCALC HgbA1c: No results  found for: HGBA1C Urine Drug Screen: No results found for: LABOPIA, COCAINSCRNUR, LABBENZ, AMPHETMU, THCU, LABBARB    IMAGING  Dg Chest 2 View 02/22/2015    No acute cardiopulmonary disease.       Ct Head Wo Contrast 02/22/2015    Unremarkable noncontrast head CT.    Mr Brain Wo Contrast 02/22/2015    Subcentimeter acute infarct LEFT mesial temporal lobe/hippocampus.  Otherwise normal MRI of the brain for age.      Mr Jodene Nam Head/brain Wo Cm 02/23/2015    No acute large vessel occlusion or high-grade stenosis.  Mild luminal irregularity of the mid to distal vessels may be artifact or, represent atherosclerosis.       PHYSICAL EXAM [name] currently not in distress. . Afebrile. Head is nontraumatic. Neck is supple without bruit.    Cardiac exam no murmur or gallop. Lungs are clear to auscultation. Distal pulses are well felt. No stiffness  Neurological Exam :   Awake  Alert oriented x 3. Normal speech and language.eye movements full without nystagmus.fundi were not visualized. Vision acuity and fields appear normal. Hearing is normal. Palatal movements are normal. Face symmetric. Tongue midline. Normal strength, tone, reflexes and coordination. Normal sensation. Gait deferred.    ASSESSMENT/PLAN Ms. CECLIA [name] is a 64 y.o. 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 Headache, dizziness and gait  instability She did not receive IV t-PA due to unknown time of onset.  Stroke:  Dominant infarct of uncertain etiology.   Resultant  no focal deficits   MRI  Subcentimeter acute infarct LEFT mesial temporal lobe/hippocampus  MRA  No acute large vessel occlusion or high-grade stenosis.   Carotid Doppler pending  2D Echo  pending  LDL 56  HgbA1c pending  VTE prophylaxis - Lovenox  Diet Heart Room service appropriate?: Yes;  Fluid consistency:: Thin  aspirin 325 mg orally every day prior to admission, now on clopidogrel 75 mg orally every day  Patient counseled to be compliant with her antithrombotic medications  Ongoing aggressive stroke risk factor management  Therapy recommendations: Pending  Disposition: Pending  Hypertension  Mildly low blood pressures - on hydrochlorothiazide 12.5 mg daily and Cozaar 100 mg daily.  Permissive hypertension (OK if < 220/120) but gradually normalize in 5-7 days  Hyperlipidemia  Home meds:  Lipitor 40 mg daily resumed in hospital  LDL 56 goal < 70  Continue statin at discharge  Diabetes  HgbA1c pending, goal < 7.0  Controlled  Other Stroke Risk Factors  Advanced age  Cigarette smoker, quit smoking 44 years ago   ETOH use  Obesity, Body mass index is 31.64 kg/(m^2).   Family hx stroke (father and sister)   Other Active Problems  History of marijuana use  PLAN  TEE tomorrow - no loop - cardiology notified  Possible lumbar puncture after Plavix held for several days  ANA, ESR, Lyme's disease antibody, angiotensin converting enzyme  Headaches - steroidal Dosepak and Topamax started  Hospital day # [place] PA-C [facility] Pager [phone] 02/23/2015, 3:40 PM I have personally examined this patient, reviewed notes, independently viewed imaging studies, participated in medical decision making and plan of care. I have made any additions or clarifications directly to the above note. Agree with note above. She presents with two-week history of intermittent headaches with dizziness and gait ataxia which seems unrelated to the MRI finding of a tiny punctate diffusion hyperintensity in the left medial temporal lobe. This finding is nonspecific and may be seen with not only acute infarcts but also seizures, transient global amnesia and may need follow-up imaging. Given the fact that patient has new onset headaches and has  history of cancer she may need a spinal tap but she is on aspirin and Plavix which may need to be held for at least 3-5 days prior to the spinal tap. Meanwhile treat her headaches with Medrol Dosepak and Topamax. Continue   stroke workup. I had a long discussion with the patient who agrees with the plan. Discuss with Dr. Aline Brochure, [place] Pager: [phone] 02/23/2015 5:43 PM       To contact Stroke Continuity provider, please refer to [email or web address] After hours, contact General Neurology

## 2015-02-23 NOTE — Progress Notes (Signed)
Called E.D and report given on patient.

## 2015-02-24 ENCOUNTER — Inpatient Hospital Stay (HOSPITAL_COMMUNITY): Payer: Medicare Other

## 2015-02-24 DIAGNOSIS — N183 Chronic kidney disease, stage 3 unspecified: Secondary | ICD-10-CM

## 2015-02-24 DIAGNOSIS — I5032 Chronic diastolic (congestive) heart failure: Secondary | ICD-10-CM

## 2015-02-24 DIAGNOSIS — E1122 Type 2 diabetes mellitus with diabetic chronic kidney disease: Secondary | ICD-10-CM

## 2015-02-24 DIAGNOSIS — I639 Cerebral infarction, unspecified: Secondary | ICD-10-CM

## 2015-02-24 DIAGNOSIS — I6310 Cerebral infarction due to embolism of unspecified precerebral artery: Secondary | ICD-10-CM

## 2015-02-24 LAB — GLUCOSE, CAPILLARY
GLUCOSE-CAPILLARY: 138 mg/dL — AB (ref 65–99)
GLUCOSE-CAPILLARY: 119 mg/dL — AB (ref 65–99)
Glucose-Capillary: 100 mg/dL — ABNORMAL HIGH (ref 65–99)

## 2015-02-24 LAB — ANGIOTENSIN CONVERTING ENZYME: Angiotensin-Converting Enzyme: 41 U/L (ref 14–82)

## 2015-02-24 LAB — HEMOGLOBIN A1C
HEMOGLOBIN A1C: 6.3 % — AB (ref 4.8–5.6)
MEAN PLASMA GLUCOSE: 134 mg/dL

## 2015-02-24 LAB — B. BURGDORFI ANTIBODIES

## 2015-02-24 LAB — ANTINUCLEAR ANTIBODIES, IFA: ANA Ab, IFA: NEGATIVE

## 2015-02-24 MED ORDER — METHYLPREDNISOLONE 4 MG PO TBPK: ORAL_TABLET | ORAL | Status: DC

## 2015-02-24 MED ORDER — TOPIRAMATE 25 MG PO TABS: 25.0000 mg | ORAL_TABLET | Freq: Two times a day (BID) | ORAL | Status: DC

## 2015-02-24 NOTE — Progress Notes (Signed)
[facility] Progress Note   Jeanette [name]  BTD:[id number]  DOB: 1950/07/19  DOA: 02/22/2015 PCP: Bartholome Bill, MD  Brief narrative: Jeanette [name] is a 64 y.o. female with a past medical history of hypertension, hyperlipidemia, non-insulin-dependent diabetes mellitus, breast cancer stage II status post mastectomy, chemotherapy, radiation and hormonal therapy. The patient presents with a complaint of a headache which has been on and off for 2 weeks. She describes it as starting in the frontal area and then radiating to the occiput. During times of intense pain, she notices trouble with her gait and nausea. She occasionally has photophobia. Headaches usually resolve with Tylenol and on the day she presented to the ER, resolved with tramadol. She does no complaints of any sinus pain or drainage. Further workup of her headache included a head CT which was normal and an MRI that revealed a subcentimeter left temporal infarct. Neurology recommended admission for further stroke workup. Noted to have a blood pressure of 180/80 in the ER.   Subjective: Headache resolved. No new complaints. No focal numbness weakness or trouble with walking. No shortness of breath or cough.  Assessment/Plan: Principal Problem:   CVA (cerebral infarction) - Left temporal/hippocampus - asymptomatic -MRA does not reveal any occlusion/stenosis -Lipid panel within normal limits - 2D echo -no source of thrombus -Carotid duplex does not reveal any stenosis -Hemoglobin A1c pending - transition from daily ASA to Plavix -Home health physical therapy recommended which I have ordered - Neuro recommending TEE which will be performed tomorrow   Headaches -Resolved by the time I evaluated her on the morning of 10/2-patient actually states that headach resolved with tramadol on the day of admission-possibly tension headaches versus in relation to hypertension-- based on her history, do not appear to be  sinus headaches -The following work up ordered by neuro-ANA, ESR, Lymes titer, ACE \ - started on a Medrol Dosepak and Topamax by Neuro  Hypertension -BP improved significantly -Continue HCTZ and losartan  Chronic diastolic heart failure -See echo report below - Have advised the patient that she needs to have better blood pressure control-have recommended she follow-up with her PCP in 1 week for recheck of her blood pressure and for titration of medications  DM 2 - Hb A1c pending -cont Metformin & Actos    Code Status:     Code Status Orders        Start     Ordered   02/23/15 0054  Full code   Continuous     02/23/15 0053     Disposition Plan: TEE tomorrow DVT prophylaxis: Lovenox Consultants:neuro- stroke team  Antibiotics: Anti-infectives    None      Objective: Filed Weights   02/22/15 1756 02/23/15 0045  Weight: 81.733 kg (180 lb 3 oz) 81 kg (178 lb 9.2 oz)    Intake/Output Summary (Last 24 hours) at 02/24/15 1310 Last data filed at 02/23/15 1816  Gross per 24 hour  Intake    240 ml  Output      0 ml  Net    240 ml     Vitals Filed Vitals:   02/23/15 1653 02/23/15 2123 02/24/15 0119 02/24/15 0545  BP: 152/79 163/66 135/69 138/70  Pulse: 72 69 68 70  Temp: 98.3 F (36.8 C) 97.6 F (36.4 C) 98 F (36.7 C) 98.1 F (36.7 C)  TempSrc: Oral Oral Oral Oral  Resp: $Remo'20 18 18 20  'ZRQjs$ Height:      Weight:  SpO2: 99% 100% 96% 97%    Exam:  General:  Pt is alert, not in acute distress  HEENT: No icterus, No thrush, oral mucosa moist  Cardiovascular: regular rate and rhythm, S1/S2 No murmur  Respiratory: clear to auscultation bilaterally   Abdomen: Soft, +Bowel sounds, non tender, non distended, no guarding  MSK: No LE edema, cyanosis or clubbing  Data Reviewed: Basic Metabolic Panel:  Recent Labs Lab 02/22/15 2003  NA 140  K 5.0  CL 107  CO2 25  GLUCOSE 94  BUN 14  CREATININE 1.22*  CALCIUM 9.4   Liver Function Tests: No  results for input(s): AST, ALT, ALKPHOS, BILITOT, PROT, ALBUMIN in the last 168 hours. No results for input(s): LIPASE, AMYLASE in the last 168 hours. No results for input(s): AMMONIA in the last 168 hours. CBC:  Recent Labs Lab 02/22/15 2003  WBC 4.9  NEUTROABS 2.3  HGB 11.6*  HCT 36.7  MCV 83.4  PLT PLATELET CLUMPS NOTED ON SMEAR, UNABLE TO ESTIMATE   Cardiac Enzymes: No results for input(s): CKTOTAL, CKMB, CKMBINDEX, TROPONINI in the last 168 hours. BNP (last 3 results) No results for input(s): BNP in the last 8760 hours.  ProBNP (last 3 results) No results for input(s): PROBNP in the last 8760 hours.  CBG:  Recent Labs Lab 02/23/15 1159 02/23/15 1642 02/23/15 2150 02/24/15 0624 02/24/15 1201  GLUCAP 84 87 145* 138* 100*    No results found for this or any previous visit (from the past 240 hour(s)).   Studies: Dg Chest 2 View  02/22/2015   CLINICAL DATA:  Headache. Gait instability for 2 weeks. Hypertension. Diabetes. Ex-smoker.  EXAM: CHEST  2 VIEW  COMPARISON:  04/05/2014  FINDINGS: Midline trachea. Normal heart size and mediastinal contours. No pleural effusion or pneumothorax. Surgical changes of left mastectomy and axillary node dissection. Clear right lung. A left mid lung granuloma causes increased density projecting over the scapular tip.  IMPRESSION: No acute cardiopulmonary disease.   Electronically Signed   By: Abigail Miyamoto M.D.   On: 02/22/2015 21:08   Ct Head Wo Contrast  02/22/2015   CLINICAL DATA:  Acute headache and dizziness for 2 weeks.  EXAM: CT HEAD WITHOUT CONTRAST  TECHNIQUE: Contiguous axial images were obtained from the base of the skull through the vertex without intravenous contrast.  COMPARISON:  None.  FINDINGS: No intracranial abnormalities are identified, including mass lesion or mass effect, hydrocephalus, extra-axial fluid collection, midline shift, hemorrhage, or acute infarction.  The visualized bony calvarium is unremarkable.   IMPRESSION: Unremarkable noncontrast head CT.   Electronically Signed   By: Margarette Canada M.D.   On: 02/22/2015 21:04   Mr Brain Wo Contrast  02/22/2015   CLINICAL DATA:  New onset headache ; 2 weeks of intermittent frontal headache, now radiating to back of head. History of diabetes, hypertension, hyperlipidemia, breast cancer.  EXAM: MRI HEAD WITHOUT CONTRAST  TECHNIQUE: Multiplanar, multiecho pulse sequences of the brain and surrounding structures were obtained without intravenous contrast.  COMPARISON:  CT head February 22, 2015 at 2014 hours  FINDINGS: Subcentimeter focus of acute ischemia within mesial periventricular LEFT temporal lobe/hippocampus.The ventricles and sulci are normal for patient's age. No abnormal parenchymal signal, mass lesions, mass effect. A few punctate foci of T2 hyperintense signal in the supratentorial brain suggest chronic small vessel ischemic disease, less than expected for age. No susceptibility artifact to suggest hemorrhage.  No abnormal extra-axial fluid collections. No extra-axial masses though, contrast enhanced sequences would be more  sensitive. Normal major intracranial vascular flow voids seen at the skull base.  Ocular globes and orbital contents are unremarkable though not tailored for evaluation. No abnormal sellar expansion. Mild paranasal sinus mucosal thickening without air-fluid levels. The mastoid air cells are well aerated. No suspicious calvarial bone marrow signal. Craniocervical junction maintained.  IMPRESSION: Subcentimeter acute infarct LEFT mesial temporal lobe/hippocampus.  Otherwise normal MRI of the brain for age.   Electronically Signed   By: Elon Alas M.D.   On: 02/22/2015 23:01   Mr Jodene Nam Head/brain Wo Cm  02/23/2015   CLINICAL DATA:  Two week history of intermittent headache, associated dizziness and gait instability, nausea, vomiting, photophobia. History of hypertension, hyperlipidemia, diabetes, breast cancer.  EXAM: MRA HEAD WITHOUT  CONTRAST  TECHNIQUE: Angiographic images of the Circle of Willis were obtained using MRA technique without intravenous contrast.  COMPARISON:  MRI of the brain February 22, 2015  FINDINGS: Anterior circulation: Normal flow related enhancement of the included cervical, petrous, cavernous and supra clinoid internal carotid arteries. Patent anterior communicating artery. Normal flow related enhancement of the anterior and middle cerebral arteries, including more distal segments. Mild luminal irregularity of the mid to distal bilateral MCA vessels suggest tortuosity and artifact, less likely atherosclerosis.  No large vessel occlusion, high-grade stenosis, aneurysm.  Posterior circulation: Codominant vertebral arteries. Basilar artery is patent, with normal flow related enhancement of the main branch vessels. Small bilateral posterior communicating arteries present. Normal flow related enhancement of the posterior cerebral arteries. Mild luminal irregularity of the mid to distal bilateral PCA vessels suggest tortuosity and artifact, less likely atherosclerosis.  No large vessel occlusion, high-grade stenosis, aneurysm.  IMPRESSION: No acute large vessel occlusion or high-grade stenosis.  Mild luminal irregularity of the mid to distal vessels may be artifact or, represent atherosclerosis.   Electronically Signed   By: Elon Alas M.D.   On: 02/23/2015 02:48    Scheduled Meds:  Scheduled Meds: . atorvastatin  40 mg Oral q morning - 10a  . calcium-vitamin D  2 tablet Oral BID  . clopidogrel  75 mg Oral Daily  . enoxaparin (LOVENOX) injection  40 mg Subcutaneous Q24H  . fluticasone  2 spray Each Nare Daily  . gabapentin  300 mg Oral TID  . hydrochlorothiazide  12.5 mg Oral Daily  . levothyroxine  88 mcg Oral QAC breakfast  . losartan  100 mg Oral Daily  . metFORMIN  1,000 mg Oral BID WC  . methylPREDNISolone  4 mg Oral 3 x daily with food  . [START ON 02/25/2015] methylPREDNISolone  4 mg Oral 4X daily  taper  . methylPREDNISolone  8 mg Oral Nightly  . pioglitazone  45 mg Oral Daily  . topiramate  25 mg Oral BID   Continuous Infusions:   Time spent on care of this patient: 35 min   [place], MD 02/24/2015, 1:10 PM  LOS: 2 days   [facility] Office  [phone] Pager - Text Page per [email or web address] If 7PM-7AM, please contact night-coverage [email or web address]

## 2015-02-24 NOTE — Evaluation (Signed)
Occupational Therapy Evaluation Patient Details Name: Jeanette [name] MRN: [id number] DOB: [birth year] Today's Date: 02/24/2015    History of Present Illness Sangita R Parkhill is a 64 y.o. female with a past medical history of hypertension, hyperlipidemia, non-insulin-dependent diabetes mellitus, breast cancer stage II status post mastectomy, chemotherapy, radiation and hormonal therapy. The patient presents with a complaint of a headache which has been on and off for 2 weeks. Further workup of her headache included a head CT which was normal and an MRI that revealed a subcentimeter left temporal infarct.     Clinical Impression   Patient admitted with above. Patient independent PTA. Patient currently functioning at an overall supervision->mod I level. D/C from acute OT services and no additional follow-up OT needs at this time. All appropriate education provided to patient. Please re-order OT if needed.      Follow Up Recommendations  No OT follow up;Supervision - Intermittent    Equipment Recommendations  None recommended by OT    Recommendations for Other Services  None at this time   Precautions / Restrictions Precautions Precautions: None Restrictions Weight Bearing Restrictions: No    Mobility Bed Mobility General bed mobility comments: Pt recieved seated in recliner  Transfers Overall transfer level: Needs assistance   Transfers: Sit to/from Stand Sit to Stand: Supervision General transfer comment: Supervision for safely, cues hand placement and technique     Balance Overall balance assessment: No apparent balance deficits (not formally assessed);Modified Independent (use of RW for safety)    ADL Overall ADL's : At baseline;Modified independent General ADL Comments: Pt overall independent -> mod I with ADLs and functional mobility/transfers. Pt using RW which is new to her, educated pt on safety with RW.     Vision Additional Comments: Pt reports initially after  stroke she had a hard time focusing on objects, now she feels she is back to baseline. Pt able to scan environment for objects and pt able to read labels on self-care item bottles./          Pertinent Vitals/Pain Pain Assessment: Faces Pain Score: 6  Faces Pain Scale: Hurts little more Pain Location: right arm at site of IV needle, occurred during MMT Pain Descriptors / Indicators: Discomfort;Grimacing Pain Intervention(s): Monitored during session;Repositioned     Hand Dominance Right   Extremity/Trunk Assessment Upper Extremity Assessment Upper Extremity Assessment: Overall WFL for tasks assessed   Lower Extremity Assessment Lower Extremity Assessment: Defer to PT evaluation   Cervical / Trunk Assessment Cervical / Trunk Assessment: Normal   Communication Communication Communication: No difficulties   Cognition Arousal/Alertness: Awake/alert Behavior During Therapy: WFL for tasks assessed/performed Overall Cognitive Status: Within Functional Limits for tasks assessed              Home Living Family/patient expects to be discharged to:: Private residence Living Arrangements: Alone Available Help at Discharge: Family;Available PRN/intermittently Type of Home: Apartment Home Access: Stairs to enter Entrance Stairs-Number of Steps: 18 Entrance Stairs-Rails: Can reach both;Left;Right Home Layout: One level     Bathroom Shower/Tub: Tub/shower unit;Curtain   Biochemist, clinical: Standard     Home Equipment: Environmental consultant - 2 wheels   Prior Functioning/Environment Level of Independence: Independent     OT Diagnosis: Generalized weakness   OT Problem List:  n/a, no acute OT needs identified    OT Treatment/Interventions:   n/a, no acute OT needs identified    OT Goals(Current goals can be found in the care plan section) Acute Rehab OT Goals Patient Stated Goal:  go home soon OT Goal Formulation: All assessment and education complete, DC therapy  OT Frequency:   n/a, no  acute OT needs identified    Barriers to D/C:  None known at this time    End of Session Equipment Utilized During Treatment: Rolling walker  Activity Tolerance: Patient tolerated treatment well Patient left: in chair;with call bell/phone within reach (seated at sink, pt mod I in room)   Time: 0076-2263 OT Time Calculation (min): 12 min Charges:  OT General Charges $OT Visit: 1 Procedure OT Evaluation $Initial OT Evaluation Tier I: 1 Procedure  Terreon Ekholm , MS, OTR/L, CLT Pager: [phone]  02/24/2015, 10:13 AM

## 2015-02-24 NOTE — Evaluation (Signed)
Speech Language Pathology Evaluation Patient Details Name: Jeanette [name] MRN: [id number] DOB: [birth year] Today's Date: 02/24/2015 Time: 5284-1324 SLP Time Calculation (min) (ACUTE ONLY): 22 min  Problem List:  Patient Active Problem List   Diagnosis Date Noted  . CKD stage 3 due to type 2 diabetes mellitus ([place]) 02/24/2015  . Chronic diastolic CHF (congestive heart failure) ([place]) 02/24/2015  . Acute CVA (cerebrovascular accident) ([place])   . 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 Multiple thyroid nodules 07/18/2014  . HLD (hyperlipidemia) 03/26/2014  . Breast cancer of upper-outer quadrant of left female breast ([place]) 03/22/2014  . 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 Diabetes ([place]) 02/05/2014  . Thyroid nodule 02/05/2014  . H/O malignant neoplasm of breast 02/05/2014  . Essential (primary) hypertension 02/05/2014   Past Medical History:  Past Medical History  Diagnosis Date  . Hypertension   . Hyperlipidemia   . Neuromuscular disorder (H[facility])     neuropathy hands and feet  . Heart murmur     many years ago  . Pneumonia ~ 2010  . Type II diabetes mellitus ([place])   . Hypothyroidism     thyroidectomy 07/18/2014  . Arthritis     "back" (07/18/2014)  . Chronic back pain   . Anemia   . Cancer of left breast ([place])   . Stroke [facility])    Past Surgical History:  Past Surgical History  Procedure Laterality Date  . Abdominal hysterectomy  1970's  . Tonsillectomy    . Bilateral oophorectomy  1980j's  . Bunionectomy Right   . Cardiac catheterization  02/16/2011    NL coronares, EF 65%, no AS or MR (Dr. Corliss Parish; [place])  . Total thyroidectomy  07/18/2014  . Appendectomy    . Carpal tunnel release Bilateral   . Mastectomy Left 2008  . Breast biopsy Left 2008  . Thyroidectomy N/A 07/18/2014    Procedure: TOTAL THYROIDECTOMY;  Surgeon: Armandina Gemma, MD;  Location: [place];  Service: General;   Laterality: N/A;   HPI:  Jeanette [name] is a 64 y.o. female with a past medical history significant for hypertension, hyperlipidemia, NIDDM, breast cancer stage II a status post adjuvant chemotherapy, radiation, and hormone therapy, and status post thyroidectomy, with chronic anemia who presents with 2 weeks worsening vertigo and gait instability..  The patient was in her usual state of health until about 2 weeks ago when she started to have "dizzy spells." these would last for hours and would only go away when she laid down and close her eyes to go to sleep. They're associated with nausea, and difficulty walking straight. She is also noticed in the last few days that she has trouble expressing some sentences. Today she had a right-sided headache and so she presented to the emergency room.   Assessment / Plan / Recommendation Clinical Impression  receptive/expressive language skills appeared to be functional.  She was able to answer yes/no questions, follow complex directions, identify and name pictures, answer simple wh questions and perform sentence completion.  She was able to easily carry on a conversation regardong ongoing world events.  The patient does complain of having to think a little more before speaking then before the CVA.  Recommended to the patient to follow up with her MD after discharge if those symptoms persist.  Acute ST needs are not identified.       SLP Assessment  Patient does not need any further Speech Lanaguage  Pathology Services    Follow Up Recommendations  None                 SLP Goals  Patient/Family Stated Goal: None stated  SLP Evaluation Prior Functioning  Cognitive/Linguistic Baseline: Within functional limits Type of Home: Apartment      Comprehension  Auditory Comprehension Overall Auditory Comprehension: Appears within functional limits for tasks assessed Yes/No Questions: Within Functional Limits Commands: Within Functional  Limits Conversation: Complex Reading Comprehension Reading Status: Within funtional limits    Expression Expression Primary Mode of Expression: Verbal Verbal Expression Overall Verbal Expression: Appears within functional limits for tasks assessed Initiation: No impairment Automatic Speech: Name;Social Response Level of Generative/Spontaneous Verbalization: Conversation Repetition: No impairment Naming: No impairment Pragmatics: No impairment Non-Verbal Means of Communication: Not applicable Written Expression Dominant Hand: Right   Oral / Motor Motor Speech Overall Motor Speech: Appears within functional limits for tasks assessed Respiration: Within functional limits Phonation: Normal Resonance: Within functional limits Articulation: Within functional limitis Intelligibility: Intelligible Motor Planning: Witnin functional limits Motor Speech Errors: Not applicable   GO    Shelly Flatten, MA, CCC-SLP Acute Rehab SLP [phone]  Lamar Sprinkles 02/24/2015, 3:22 PM

## 2015-02-24 NOTE — Progress Notes (Signed)
      [facility] has been requested to perform a transesophageal echocardiogram on Jeanette [name] for the acute incarct in the left mesial temporal lobe/hippocampus she was recently diagnosed with.  After careful review of history and examination, the risks and benefits of transesophageal echocardiogram have been explained including risks of esophageal damage, perforation (1:10,000 risk), bleeding, pharyngeal hematoma as well as other potential complications associated with conscious sedation including aspiration, arrhythmia, respiratory failure and death. Alternatives to treatment were discussed, questions were answered. Patient is willing to proceed.   The procedure was initially scheduled for 02/24/2015, however it is now going to be on 02/25/2015 at 1000 with Dr. Radford Pax. The patient was notified of this and her diet has been changed to reflect this, with her having a Heart Healthy/Carb Modified diet at this time and NPO after midnight.   BP has been 125/66 - 163/79 in the past 24 hours. No requirement for pressors. Hgb stable at 11.6.  Platelet count unable to be reported as platelet clumping has been noted. Was last 186,000 in 02/2014.  Erma Heritage, PA-C 02/24/2015 10:50 AM

## 2015-02-24 NOTE — Progress Notes (Signed)
*  PRELIMINARY RESULTS* Vascular Ultrasound Carotid Duplex (Doppler) has been completed.  Preliminary findings: Bilateral: No significant (1-39%) ICA stenosis. Antegrade vertebral flow.    Landry Mellow, RDMS, RVT  02/24/2015, 11:45 AM

## 2015-02-24 NOTE — Progress Notes (Signed)
Addendum to PT Evaluation:  02/24/15 1156  Modified Rankin (Stroke Patients Only)  Pre-Morbid Rankin Score 0  Modified Rankin 1   Jeanette [name], [place] Pager [phone] 02/24/2015

## 2015-02-24 NOTE — Evaluation (Addendum)
Physical Therapy Evaluation Patient Details Name: Jeanette [name] MRN: [id number] DOB: 07-Aug-1950 Today's Date: 02/24/2015   History of Present Illness  Jeanette [name] is a 64 y.o. female with a past medical history of hypertension, hyperlipidemia, non-insulin-dependent diabetes mellitus, breast cancer stage II status post mastectomy, chemotherapy, radiation and hormonal therapy. The patient presents with a complaint of a headache which has been on and off for 2 weeks. Further workup of her headache included a head CT which was normal and an MRI that revealed a subcentimeter left temporal infarct.    Clinical Impression  Pt admitted with above diagnosis. Pt currently with functional limitations due to the deficits listed below (see PT Problem List). Pt will benefit from skilled PT to increase their independence and safety with mobility to allow discharge to the venue listed below.  Pt demonstrating unsteadiness on gait in hallway.  Did better with RW and encouraged to use hers at home.  Pt also has 18 stairs to enter 2nd floor apartment. Was able to manage 11 today with MIN/guard with fatigue.  Recommend HHPT to work on A pt to return to her higher prior level of function.     Follow Up Recommendations Home health PT    Equipment Recommendations  None recommended by PT    Recommendations for Other Services       Precautions / Restrictions Precautions Precautions: None Restrictions Weight Bearing Restrictions: No      Mobility  Bed Mobility Overal bed mobility: Modified Independent             General bed mobility comments: Pt recieved seated in recliner  Transfers Overall transfer level: Needs assistance Equipment used: None;Rolling walker (2 wheeled) Transfers: Sit to/from Stand Sit to Stand: Supervision         General transfer comment: Supervision for safely, cues hand placement and technique   Ambulation/Gait Ambulation/Gait assistance: Min guard;Min  assist Ambulation Distance (Feet): 96 Feet (96' without AD and 150' with RW) Assistive device: Rolling walker (2 wheeled);1 person hand held assist Gait Pattern/deviations: Step-through pattern;Decreased step length - right;Decreased step length - left     General Gait Details: Pt with generalized unsteadiness with gait constantly reaching for rail or PT's hand in hallway.  then did gait trial with RW and pt steadier and verbalized feeling steadier.    Stairs Stairs: Yes Stairs assistance: Min guard Stair Management: One rail Left;Step to pattern Number of Stairs: 11 General stair comments: cues for step to pattern.  Wheelchair Mobility    Modified Rankin (Stroke Patients Only)       Balance Overall balance assessment: Needs assistance   Sitting balance-Leahy Scale: Normal       Standing balance-Leahy Scale: Fair Standing balance comment: Fair +               High Level Balance Comments: Pt able to manage in room well without AD, but does furniture cruise.             Pertinent Vitals/Pain Pain Assessment: Faces Pain Score: 6  Faces Pain Scale: Hurts little more Pain Location: right arm at site of IV needle, occurred during MMT Pain Descriptors / Indicators: Discomfort;Grimacing Pain Intervention(s): Monitored during session;Repositioned    Home Living Family/patient expects to be discharged to:: Private residence Living Arrangements: Alone Available Help at Discharge: Family;Available PRN/intermittently Type of Home: Apartment Home Access: Stairs to enter Entrance Stairs-Rails: Can reach both;Left;Right Entrance Stairs-Number of Steps: 18 Home Layout: One level Home Equipment: Gilford Rile -  2 wheels      Prior Function Level of Independence: Independent               Hand Dominance   Dominant Hand: Right    Extremity/Trunk Assessment   Upper Extremity Assessment: Defer to OT evaluation           Lower Extremity Assessment: Overall WFL  for tasks assessed      Cervical / Trunk Assessment: Normal  Communication   Communication: No difficulties  Cognition Arousal/Alertness: Awake/alert Behavior During Therapy: WFL for tasks assessed/performed Overall Cognitive Status: Within Functional Limits for tasks assessed                      General Comments      Exercises        Assessment/Plan    PT Assessment Patient needs continued PT services  PT Diagnosis Difficulty walking   PT Problem List Decreased balance;Decreased knowledge of use of DME;Decreased mobility  PT Treatment Interventions DME instruction;Gait training;Stair training;Functional mobility training;Therapeutic activities;Balance training   PT Goals (Current goals can be found in the Care Plan section) Acute Rehab PT Goals Patient Stated Goal: go home soon PT Goal Formulation: With patient Time For Goal Achievement: 03/03/15 Potential to Achieve Goals: Good    Frequency Min 4X/week   Barriers to discharge        Co-evaluation               End of Session Equipment Utilized During Treatment: Gait belt Activity Tolerance: Patient tolerated treatment well Patient left: in chair;Other (comment);with call bell/phone within reach (with OT entering) Nurse Communication: Mobility status         Time: 4734-0370 PT Time Calculation (min) (ACUTE ONLY): 21 min   Charges:   PT Evaluation $Initial PT Evaluation Tier I: 1 Procedure     PT G Codes:        Machai Desmith LUBECK 02/24/2015, 11:57 AM

## 2015-02-24 NOTE — Progress Notes (Addendum)
STROKE TEAM PROGRESS NOTE    SUBJECTIVE (INTERVAL HISTORY) Patient without complaints. Headache now resolved.   OBJECTIVE Temp:  [97.6 F (36.4 C)-98.3 F (36.8 C)] 98.1 F (36.7 C) (10/03 0545) Pulse Rate:  [68-76] 70 (10/03 0545) Cardiac Rhythm:  [-] Normal sinus rhythm (10/03 0700) Resp:  [16-20] 20 (10/03 0545) BP: (125-163)/(66-79) 138/70 mmHg (10/03 0545) SpO2:  [96 %-100 %] 97 % (10/03 0545)  CBC:   Recent Labs Lab 02/22/15 2003  WBC 4.9  NEUTROABS 2.3  HGB 11.6*  HCT 36.7  MCV 83.4  PLT PLATELET CLUMPS NOTED ON SMEAR, UNABLE TO ESTIMATE    Basic Metabolic Panel:   Recent Labs Lab 02/22/15 2003  NA 140  K 5.0  CL 107  CO2 25  GLUCOSE 94  BUN 14  CREATININE 1.22*  CALCIUM 9.4    Lipid Panel:     Component Value Date/Time   CHOL 143 02/23/2015 0640   TRIG 34 02/23/2015 0640   HDL 80 02/23/2015 0640   CHOLHDL 1.8 02/23/2015 0640   VLDL 7 02/23/2015 0640   LDLCALC 56 02/23/2015 0640   HgbA1c:  Lab Results  Component Value Date   HGBA1C 6.3* 02/23/2015     IMAGING  Dg Chest 2 View 02/22/2015    No acute cardiopulmonary disease.      Ct Head Wo Contrast 02/22/2015    Unremarkable noncontrast head CT.   Mr Brain Wo Contrast 02/22/2015    Subcentimeter acute infarct LEFT mesial temporal lobe/hippocampus.  Otherwise normal MRI of the brain for age.     Mr Jodene Nam Head/brain Wo Cm 02/23/2015    No acute large vessel occlusion or high-grade stenosis.  Mild luminal irregularity of the mid to distal vessels may be artifact or, represent atherosclerosis.     Carotid Doppler  There is 1-39% bilateral ICA stenosis. Vertebral artery flow is antegrade.    2D Echocardiogram   - Left ventricle: The cavity size was normal. Systolic function wasnormal. The estimated ejection fraction was in the range of 60%to 65%. Wall motion was normal; there were no regional wallmotion abnormalities. Features are consistent with a pseudonormalleft ventricular filling  pattern, with concomitant abnormalrelaxation and increased filling pressure (grade 2 diastolicdysfunction). - Aortic valve: There was trivial regurgitation.   PHYSICAL EXAM [name] currently not in distress. . Afebrile. Head is nontraumatic. Neck is supple without bruit.    Cardiac exam no murmur or gallop. Lungs are clear to auscultation. Distal pulses are well felt. No stiffness Neurological Exam :   Awake  Alert oriented x 3. Normal speech and language.eye movements full without nystagmus.fundi were not visualized. Vision acuity and fields appear normal. Hearing is normal. Palatal movements are normal. Face symmetric. Tongue midline. Normal strength, tone, reflexes and coordination. Normal sensation. Gait deferred.    ASSESSMENT/PLAN [name] is a 64 y.o. 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  Stroke:  L mesial temporal lobe infarct, of uncertain etiology, possibly embolic (location can be related to migraine/PFO combo). Other abnormalities in this area can be associated with seizure, mets (hx breast cancer), tumor, TGA.limbic encephalitis-but except headache and dizziness which has now resolved with migraine treatment patient has no other symptoms to suggest any of the above etiologies  Resultant  no focal deficits   MRI  Subcentimeter acute infarct LEFT mesial temporal lobe/hippocampus  MRA  No acute large vessel occlusion or high-grade stenosis.   Carotid Doppler No significant stenosis   2D Echo  No source of embolus   TEE tomorrow - no loop   LDL 56  HgbA1c 6.3  ANA, ESR, Lyme's disease antibody, angiotensin converting enzyme - neg  VTE prophylaxis - Lovenox Diet heart healthy/carb modified Room service  appropriate?: Yes; Fluid consistency:: Thin Diet NPO time specified Except for: Sips with Meds  aspirin 325 mg orally every day prior to admission, now on clopidogrel 75 mg orally every day  Patient counseled to be compliant with her antithrombotic medications  Ongoing aggressive stroke risk factor management  Repeat MRI at followup to see if abnormality labeled as stroke still present.   At follow up, consider lumbar puncture after Plavix held for several days  Therapy recommendations: HH PT  Disposition: home with [facility]  Anticipate discharge tomorrow after TEE  Sudden onset migraine without hx of headache  Headaches - steroidal Dosepak and Topamax started  Hypertension stable  Hyperlipidemia  Home meds:  Lipitor 40 mg daily resumed in hospital  LDL 56 goal < 70  Continue statin at discharge  Diabetes  HgbA1c 6.3, at goal < 7.0  Controlled  Other Stroke Risk Factors  Advanced age  Cigarette smoker, quit smoking 44 years ago   ETOH use  Obesity, Body mass index is 31.64 kg/(m^2).   Family hx stroke (father and sister)  Other Active Problems  History of marijuana use  Hospital Day# [place] for Pager information 02/24/2015 3:06 PM  I have personally examined this patient, reviewed notes, independently viewed imaging studies, participated in medical decision making and plan of care. I have made any additions or clarifications directly to the above note. Agree with note above. We'll check a PFO which is scheduled for tomorrow. Continue Topamax for headache prevention and Medrol Dosepak tapering pack for migraine.  Antony Contras, MD Medical Director [facility] Pager: [phone] 02/24/2015 3:58 PM  To contact Stroke Continuity provider, please refer to [email or web address] After hours, contact General Neurology

## 2015-02-24 NOTE — Care Management Note (Signed)
Case Management Note  Patient Details  Name: WAVERLY TARQUINIO MRN: [id number] Date of Birth: [birth year]  Subjective/Objective:                    Action/Plan: Patient admitted with CVA. Patient is from home alone. Plan is to discharge home with home health PT. CM spoke with the patient at the bedside and gave her the list of home health agencies in her area. Patient selected [facility]. CM spoke with [facility] and they accepted the referral. H&P, orders, demographics, PT eval all faxed to Kim at [phone] as per request. Bedside RN aware.   Expected Discharge Date:  02/25/15               Expected Discharge Plan:  [place]  In-House Referral:     Discharge planning Services  CM Consult  Post Acute Care Choice:    Choice offered to:  Patient  DME Arranged:    DME Agency:     HH Arranged:  PT HH Agency:  [facility]  Status of Service:  In process, will continue to follow  Medicare Important Message Given:    Date Medicare IM Given:    Medicare IM give by:    Date Additional Medicare IM Given:    Additional Medicare Important Message give by:     If discussed at [place] of Stay Meetings, dates discussed:    Additional Comments:  Pollie Friar, RN 02/24/2015, 3:01 PM

## 2015-02-25 ENCOUNTER — Encounter (HOSPITAL_COMMUNITY): Payer: Self-pay | Admitting: *Deleted

## 2015-02-25 ENCOUNTER — Inpatient Hospital Stay (HOSPITAL_COMMUNITY): Payer: Medicare Other

## 2015-02-25 ENCOUNTER — Encounter (HOSPITAL_COMMUNITY)
Admission: EM | Disposition: A | Discharge status: Home Health | Payer: Self-pay | Source: Home / Self Care | Attending: Internal Medicine

## 2015-02-25 DIAGNOSIS — I639 Cerebral infarction, unspecified: Secondary | ICD-10-CM

## 2015-02-25 DIAGNOSIS — I10 Essential (primary) hypertension: Secondary | ICD-10-CM

## 2015-02-25 HISTORY — PX: TEE WITHOUT CARDIOVERSION: SHX5443

## 2015-02-25 LAB — GLUCOSE, CAPILLARY
Glucose-Capillary: 124 mg/dL — ABNORMAL HIGH (ref 65–99)
Glucose-Capillary: 106 mg/dL — ABNORMAL HIGH (ref 65–99)
Glucose-Capillary: 95 mg/dL (ref 65–99)

## 2015-02-25 SURGERY — ECHOCARDIOGRAM, TRANSESOPHAGEAL
Anesthesia: Moderate Sedation

## 2015-02-25 MED ORDER — DIPHENHYDRAMINE HCL 50 MG/ML IJ SOLN
INTRAMUSCULAR | Status: AC
  Filled 2015-02-25: qty 1

## 2015-02-25 MED ORDER — FENTANYL CITRATE (PF) 100 MCG/2ML IJ SOLN
INTRAMUSCULAR | Status: AC
  Filled 2015-02-25: qty 2

## 2015-02-25 MED ORDER — BUTAMBEN-TETRACAINE-BENZOCAINE 2-2-14 % EX AERO
INHALATION_SPRAY | CUTANEOUS | Status: DC | PRN
  Administered 2015-02-25: 2 via TOPICAL

## 2015-02-25 MED ORDER — FENTANYL CITRATE (PF) 100 MCG/2ML IJ SOLN
INTRAMUSCULAR | Status: DC | PRN
  Administered 2015-02-25: 12.5 ug via INTRAVENOUS
  Administered 2015-02-25: 25 ug via INTRAVENOUS
  Administered 2015-02-25: 12.5 ug via INTRAVENOUS

## 2015-02-25 MED ORDER — CLOPIDOGREL BISULFATE 75 MG PO TABS: 75.0000 mg | ORAL_TABLET | Freq: Every day | ORAL | Status: DC

## 2015-02-25 MED ORDER — MIDAZOLAM HCL 5 MG/ML IJ SOLN
INTRAMUSCULAR | Status: AC
  Filled 2015-02-25: qty 2

## 2015-02-25 MED ORDER — SODIUM CHLORIDE 0.9 % IV SOLN
INTRAVENOUS | Status: DC
  Administered 2015-02-25: 09:00:00 via INTRAVENOUS

## 2015-02-25 MED ORDER — METHYLPREDNISOLONE 4 MG PO TBPK: ORAL_TABLET | ORAL | Status: DC

## 2015-02-25 MED ORDER — MIDAZOLAM HCL 10 MG/2ML IJ SOLN
INTRAMUSCULAR | Status: DC | PRN
  Administered 2015-02-25: 1 mg via INTRAVENOUS
  Administered 2015-02-25: 2 mg via INTRAVENOUS
  Administered 2015-02-25: 1 mg via INTRAVENOUS

## 2015-02-25 MED ORDER — TRAMADOL HCL 50 MG PO TABS: 50.0000 mg | ORAL_TABLET | Freq: Every day | ORAL | Status: DC | PRN

## 2015-02-25 NOTE — Progress Notes (Signed)
Discharge orders received, Pt for discharge home today. IV d/c'd. D/c instructions and RX given with verbalized understanding. Family at bedside to assist patient with discharge. Staff bought pt downstairs via wheelchair. 02/25/15 1619

## 2015-02-25 NOTE — CV Procedure (Signed)
    PROCEDURE NOTE:  Procedure:  Transesophageal echocardiogram Operator:  Fransico Him, MD Indications:  CVA Complications: None IV Meds:Versed 4mg , Fentanyl 67mcg IV  Results: Normal LV size and function Normal RV size and function Normal RA Normal LA and LA appendage.   Normal TV with trivial TR Normal PV with trivial PR Normal MV with trivial MR Normal trileaflet AV with trivial AR Normal interatrial septum with no evidence of shunt by colorflow dopper or agitated saline contrast Mild calcific plaque in aortic root  The patient tolerated the procedure well and was transferred back to their room in stable condition.  Signed: Fransico Him, MD [facility]

## 2015-02-25 NOTE — Progress Notes (Signed)
  Echocardiogram Echocardiogram Transesophageal has been performed.  Jeanette [name] 02/25/2015, 10:54 AM

## 2015-02-25 NOTE — Interval H&P Note (Signed)
History and Physical Interval Note:  02/25/2015 10:06 AM  Jeanette [name]  has presented today for surgery, with the diagnosis of stroke  The various methods of treatment have been discussed with the patient and family. After consideration of risks, benefits and other options for treatment, the patient has consented to  Procedure(s): TRANSESOPHAGEAL ECHOCARDIOGRAM (TEE) (N/A) as a surgical intervention .  The patient's history has been reviewed, patient examined, no change in status, stable for surgery.  I have reviewed the patient's chart and labs.  Questions were answered to the patient's satisfaction.     TURNER,TRACI R

## 2015-02-25 NOTE — Care Management Important Message (Signed)
Important Message  Patient Details  Name: Jeanette [name] MRN: [id number] Date of Birth: 01-Dec-1950   Medicare Important Message Given:  Yes-second notification given    Delorse Lek 02/25/2015, 11:32 AM

## 2015-02-25 NOTE — Progress Notes (Addendum)
STROKE TEAM PROGRESS NOTE    SUBJECTIVE (INTERVAL HISTORY) Patient without complaints. Headache now resolved.wants to go home. TEE normal. Lab work for [employer], lupus, sarcoid, diabetes and lipid profile were all normal.   OBJECTIVE Temp:  [97.8 F (36.6 C)-98.5 F (36.9 C)] 98.5 F (36.9 C) (10/04 1050) Pulse Rate:  [66-82] 76 (10/04 1050) Cardiac Rhythm:  [-] Normal sinus rhythm (10/04 0834) Resp:  [9-26] 17 (10/04 1050) BP: (112-186)/(44-76) 146/65 mmHg (10/04 1050) SpO2:  [94 %-100 %] 94 % (10/04 1050)  CBC:   Recent Labs Lab 02/22/15 2003  WBC 4.9  NEUTROABS 2.3  HGB 11.6*  HCT 36.7  MCV 83.4  PLT PLATELET CLUMPS NOTED ON SMEAR, UNABLE TO ESTIMATE    Basic Metabolic Panel:   Recent Labs Lab 02/22/15 2003  NA 140  K 5.0  CL 107  CO2 25  GLUCOSE 94  BUN 14  CREATININE 1.22*  CALCIUM 9.4    Lipid Panel:     Component Value Date/Time   CHOL 143 02/23/2015 0640   TRIG 34 02/23/2015 0640   HDL 80 02/23/2015 0640   CHOLHDL 1.8 02/23/2015 0640   VLDL 7 02/23/2015 0640   LDLCALC 56 02/23/2015 0640   HgbA1c:  Lab Results  Component Value Date   HGBA1C 6.3* 02/23/2015     IMAGING  Dg Chest 2 View 02/22/2015    No acute cardiopulmonary disease.      Ct Head Wo Contrast 02/22/2015    Unremarkable noncontrast head CT.   Mr Brain Wo Contrast 02/22/2015    Subcentimeter acute infarct LEFT mesial temporal lobe/hippocampus.  Otherwise normal MRI of the brain for age.     Mr Jodene Nam Head/brain Wo Cm 02/23/2015    No acute large vessel occlusion or high-grade stenosis.  Mild luminal irregularity of the mid to distal vessels may be artifact or, represent atherosclerosis.     Carotid Doppler  There is 1-39% bilateral ICA stenosis. Vertebral artery flow is antegrade.    2D Echocardiogram   - Left ventricle: The cavity size was normal. Systolic function wasnormal. The estimated ejection fraction was in the range of 60%to 65%. Wall motion was normal; there  were no regional wallmotion abnormalities. Features are consistent with a pseudonormalleft ventricular filling pattern, with concomitant abnormalrelaxation and increased filling pressure (grade 2 diastolicdysfunction). - Aortic valve: There was trivial regurgitation.   PHYSICAL EXAM [name] currently not in distress. . Afebrile. Head is nontraumatic. Neck is supple without bruit.    Cardiac exam no murmur or gallop. Lungs are clear to auscultation. Distal pulses are well felt. No stiffness Neurological Exam :   Awake  Alert oriented x 3. Normal speech and language.eye movements full without nystagmus.fundi were not visualized. Vision acuity and fields appear normal. Hearing is normal. Palatal movements are normal. Face symmetric. Tongue midline. Normal strength, tone, reflexes and coordination. Normal sensation. Gait deferred.    ASSESSMENT/PLAN Jeanette [name] is a 64 y.o. 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   Carotid Doppler No significant stenosis   2D Echo  No source of embolus   TEE no clot or PFO  LDL 56  HgbA1c 6.3  ANA, ESR, Lyme's disease antibody, angiotensin converting enzyme - neg  VTE prophylaxis  - Lovenox Diet - low sodium heart healthy Diet Carb Modified Diet regular Room service appropriate?: Yes; Fluid consistency:: Thin  aspirin 325 mg orally every day prior to admission, now on clopidogrel 75 mg orally every day  Patient counseled to be compliant with her antithrombotic medications  Ongoing aggressive stroke risk factor management  Repeat MRI at followup to see if abnormality labeled as stroke still present.   At follow up, consider lumbar puncture after Plavix held for several days  Therapy recommendations: HH PT  Disposition: home with [facility]  Anticipate discharge tomorrow after TEE  Sudden onset migraine without hx of headache  Headaches - steroidal Dosepak and Topamax started  Hypertension stable  Hyperlipidemia  Home meds:  Lipitor 40 mg daily resumed in hospital  LDL 56 goal < 70  Continue statin at discharge  Diabetes  HgbA1c 6.3, at goal < 7.0  Controlled  Other Stroke Risk Factors  Advanced age  Cigarette smoker, quit smoking 44 years ago   ETOH use  Obesity, Body mass index is 31.64 kg/(m^2).   Family hx stroke (father and sister)  Other Active Problems  History of marijuana use  Hospital Day# [place] for Pager information 02/25/2015 1:41 PM  I have personally examined this patient, reviewed notes, independently viewed imaging studies, participated in medical decision making and plan of care. I have made any additions or clarifications directly to the above note. Discharge home on Plavix. Follow-up as an outpatient in stroke clinic in 2 months. Continue Topamax for headache prevention and Medrol Dosepak tapering pack for migraine. Patient interested in considering participation in the RESPECT ESUS trial. Given written information to review and call us if interested.  Antony Contras, MD Medical Director [facility] Pager: [phone] 02/25/2015 1:41 PM  To contact Stroke  Continuity provider, please refer to [email or web address] After hours, contact General Neurology

## 2015-02-25 NOTE — Discharge Summary (Signed)
PATIENT DETAILS Name: Jeanette [name] Age: 64 y.o. Sex: female Date of Birth: [birth year] MRN: [id number]. Admitting Physician: Edwin Dada, MD ERX:VQMG, Dola Factor, MD  Admit Date: 02/22/2015 Discharge date: 02/25/2015  Recommendations for Outpatient Follow-up:  1. Please repeat A1c and lipid panel in the next 3 months  PRIMARY DISCHARGE DIAGNOSIS:  Principal Problem:   Acute CVA (cerebrovascular accident) ([place]) Active Problems:   Anemia in neoplastic disease   Diabetes ([place])   HLD (hyperlipidemia)   Essential (primary) hypertension   Hypothyroidism, postsurgical   CKD stage 3 due to type 2 diabetes mellitus (HCC)   Chronic diastolic CHF (congestive heart failure) ([place])      PAST MEDICAL HISTORY: Past Medical History  Diagnosis Date  . Hypertension   . Hyperlipidemia   . Neuromuscular disorder (HCC)     neuropathy hands and feet  . Heart murmur     many years ago  . Pneumonia ~ 2010  . Type II diabetes mellitus ([place])   . Hypothyroidism     thyroidectomy 07/18/2014  . Arthritis     "back" (07/18/2014)  . Chronic back pain   . Anemia   . Cancer of left breast ([place])   . Stroke [facility])     DISCHARGE MEDICATIONS: Current Discharge Medication List    START taking these medications   Details  clopidogrel (PLAVIX) 75 MG tablet Take 1 tablet (75 mg total) by mouth daily. Qty: 30 tablet, Refills: 0    methylPREDNISolone (MEDROL DOSEPAK) 4 MG TBPK tablet follow package directions Qty: 21 tablet, Refills: 0      CONTINUE these medications which have CHANGED   Details  traMADol (ULTRAM) 50 MG tablet Take 1 tablet (50 mg total) by mouth daily as needed for moderate pain. Qty: 30 tablet, Refills: 0      CONTINUE these medications which have NOT CHANGED   Details  atorvastatin (LIPITOR) 20 MG tablet Take 20 mg by mouth every morning.     benzonatate (TESSALON) 200 MG capsule Take 200 mg by mouth 3 (three) times daily as needed.    calcium-vitamin  D (OSCAL WITH D) 500-200 MG-UNIT per tablet Take 2 tablets by mouth 2 (two) times daily. Qty: 60 tablet, Refills: 1    fluticasone (FLONASE) 50 MCG/ACT nasal spray Place 2 sprays into both nostrils daily.    hydrochlorothiazide (HYDRODIURIL) 12.5 MG tablet Take 12.5 mg by mouth daily.    losartan (COZAAR) 100 MG tablet Take 100 mg by mouth daily.     metFORMIN (GLUCOPHAGE) 1000 MG tablet Take 1,000 mg by mouth 2 (two) times daily with a meal.     Multiple Vitamins-Minerals (MULTIVITAMIN WITH MINERALS) tablet Take 1 tablet by mouth daily.     pioglitazone (ACTOS) 45 MG tablet Take 45 mg by mouth daily.    SYNTHROID 88 MCG tablet Take 1 tablet (88 mcg total) by mouth daily before breakfast. Qty: 30 tablet, Refills: 3    gabapentin (NEURONTIN) 300 MG capsule Take 300 mg by mouth 3 (three) times daily.      STOP taking these medications     aspirin 325 MG tablet         ALLERGIES:  No Known Allergies  BRIEF HPI:  See H&P, Labs, Consult and Test reports for all details in brief, patient is a 64 year old female with history of hypertension, dyslipidemia, non-insulin-dependent diabetes who presented with headache. CT head was negative, however MRI showed aleft temporal infarct.  CONSULTATIONS:   neurology  PERTINENT RADIOLOGIC  STUDIES: Dg Chest 2 View  02/22/2015   CLINICAL DATA:  Headache. Gait instability for 2 weeks. Hypertension. Diabetes. Ex-smoker.  EXAM: CHEST  2 VIEW  COMPARISON:  04/05/2014  FINDINGS: Midline trachea. Normal heart size and mediastinal contours. No pleural effusion or pneumothorax. Surgical changes of left mastectomy and axillary node dissection. Clear right lung. A left mid lung granuloma causes increased density projecting over the scapular tip.  IMPRESSION: No acute cardiopulmonary disease.   Electronically Signed   By: Abigail Miyamoto M.D.   On: 02/22/2015 21:08   Ct Head Wo Contrast  02/22/2015   CLINICAL DATA:  Acute headache and dizziness for 2 weeks.   EXAM: CT HEAD WITHOUT CONTRAST  TECHNIQUE: Contiguous axial images were obtained from the base of the skull through the vertex without intravenous contrast.  COMPARISON:  None.  FINDINGS: No intracranial abnormalities are identified, including mass lesion or mass effect, hydrocephalus, extra-axial fluid collection, midline shift, hemorrhage, or acute infarction.  The visualized bony calvarium is unremarkable.  IMPRESSION: Unremarkable noncontrast head CT.   Electronically Signed   By: Margarette Canada M.D.   On: 02/22/2015 21:04   Mr Brain Wo Contrast  02/22/2015   CLINICAL DATA:  New onset headache ; 2 weeks of intermittent frontal headache, now radiating to back of head. History of diabetes, hypertension, hyperlipidemia, breast cancer.  EXAM: MRI HEAD WITHOUT CONTRAST  TECHNIQUE: Multiplanar, multiecho pulse sequences of the brain and surrounding structures were obtained without intravenous contrast.  COMPARISON:  CT head February 22, 2015 at 2014 hours  FINDINGS: Subcentimeter focus of acute ischemia within mesial periventricular LEFT temporal lobe/hippocampus.The ventricles and sulci are normal for patient's age. No abnormal parenchymal signal, mass lesions, mass effect. A few punctate foci of T2 hyperintense signal in the supratentorial brain suggest chronic small vessel ischemic disease, less than expected for age. No susceptibility artifact to suggest hemorrhage.  No abnormal extra-axial fluid collections. No extra-axial masses though, contrast enhanced sequences would be more sensitive. Normal major intracranial vascular flow voids seen at the skull base.  Ocular globes and orbital contents are unremarkable though not tailored for evaluation. No abnormal sellar expansion. Mild paranasal sinus mucosal thickening without air-fluid levels. The mastoid air cells are well aerated. No suspicious calvarial bone marrow signal. Craniocervical junction maintained.  IMPRESSION: Subcentimeter acute infarct LEFT mesial  temporal lobe/hippocampus.  Otherwise normal MRI of the brain for age.   Electronically Signed   By: Elon Alas M.D.   On: 02/22/2015 23:01   Mr Jodene Nam Head/brain Wo Cm  02/23/2015   CLINICAL DATA:  Two week history of intermittent headache, associated dizziness and gait instability, nausea, vomiting, photophobia. History of hypertension, hyperlipidemia, diabetes, breast cancer.  EXAM: MRA HEAD WITHOUT CONTRAST  TECHNIQUE: Angiographic images of the Circle of Willis were obtained using MRA technique without intravenous contrast.  COMPARISON:  MRI of the brain February 22, 2015  FINDINGS: Anterior circulation: Normal flow related enhancement of the included cervical, petrous, cavernous and supra clinoid internal carotid arteries. Patent anterior communicating artery. Normal flow related enhancement of the anterior and middle cerebral arteries, including more distal segments. Mild luminal irregularity of the mid to distal bilateral MCA vessels suggest tortuosity and artifact, less likely atherosclerosis.  No large vessel occlusion, high-grade stenosis, aneurysm.  Posterior circulation: Codominant vertebral arteries. Basilar artery is patent, with normal flow related enhancement of the main branch vessels. Small bilateral posterior communicating arteries present. Normal flow related enhancement of the posterior cerebral arteries. Mild luminal irregularity of the  mid to distal bilateral PCA vessels suggest tortuosity and artifact, less likely atherosclerosis.  No large vessel occlusion, high-grade stenosis, aneurysm.  IMPRESSION: No acute large vessel occlusion or high-grade stenosis.  Mild luminal irregularity of the mid to distal vessels may be artifact or, represent atherosclerosis.   Electronically Signed   By: Elon Alas M.D.   On: 02/23/2015 02:48     PERTINENT LAB RESULTS: CBC:  Recent Labs  02/22/15 2003  WBC 4.9  HGB 11.6*  HCT 36.7  PLT PLATELET CLUMPS NOTED ON SMEAR, UNABLE TO  ESTIMATE   CMET CMP     Component Value Date/Time   NA 140 02/22/2015 2003   NA 143 03/22/2014 1140   K 5.0 02/22/2015 2003   K 4.0 03/22/2014 1140   CL 107 02/22/2015 2003   CO2 25 02/22/2015 2003   CO2 29 03/22/2014 1140   GLUCOSE 94 02/22/2015 2003   GLUCOSE 89 03/22/2014 1140   BUN 14 02/22/2015 2003   BUN 19.6 03/22/2014 1140   CREATININE 1.22* 02/22/2015 2003   CREATININE 1.1 03/22/2014 1140   CALCIUM 9.4 02/22/2015 2003   CALCIUM 10.3 03/22/2014 1140   PROT 7.0 03/22/2014 1140   ALBUMIN 4.1 03/22/2014 1140   AST 18 03/22/2014 1140   ALT 20 03/22/2014 1140   ALKPHOS 58 03/22/2014 1140   BILITOT 1.10 03/22/2014 1140   GFRNONAA 46* 02/22/2015 2003   GFRAA 54* 02/22/2015 2003    GFR Estimated Creatinine Clearance: 48.2 mL/min (by C-G formula based on Cr of 1.22). No results for input(s): LIPASE, AMYLASE in the last 72 hours. No results for input(s): CKTOTAL, CKMB, CKMBINDEX, TROPONINI in the last 72 hours. Invalid input(s): POCBNP No results for input(s): DDIMER in the last 72 hours.  Recent Labs  02/23/15 0640  HGBA1C 6.3*    Recent Labs  02/23/15 0640  CHOL 143  HDL 80  LDLCALC 56  TRIG 34  CHOLHDL 1.8   No results for input(s): TSH, T4TOTAL, T3FREE, THYROIDAB in the last 72 hours.  Invalid input(s): FREET3 No results for input(s): VITAMINB12, FOLATE, FERRITIN, TIBC, IRON, RETICCTPCT in the last 72 hours. Coags: No results for input(s): INR in the last 72 hours.  Invalid input(s): PT Microbiology: No results found for this or any previous visit (from the past 240 hour(s)).   BRIEF HOSPITAL COURSE:   Principal Problem:   Acute CVA (cerebrovascular accident) [facility]): Incidental finding during workup for headache. 2-D echo showed no evidence of thrombus, underwent TEE which was negative for thrombus and PFO. Carotid duplex was negative for significant stenosis. LDL 56 (goal<70), A1c 6.3. Recommendations from neurology to continue Plavix. Continue  with Lipitor on discharge.  Active Problems: Headache: Suspected migraine headache, continue steroid Dosepak trial. Given significant drug interaction with Topamax and Plavix-pharmacy recommending that we discontinue Topamax. Will continue with Neurontin. Further optimization deferred to neurology on outpatient follow-up. Next completely resolved at the time of discharge. ANA, and Ace titer was negative. Lyme's titer was also negative.  Hypertension: Controlled, continue with HCTZ losartan on discharge.  Type 2 diabetes: A1c is above. Continue metformin and Actos on discharge.  Chronic diastolic heart failure: Clinically compensated.  Hypothyroidism: Continue with Synthroid.   Anemia in neoplastic disease: Stable for outpatient monitoring.   TODAY-DAY OF DISCHARGE:  Subjective:   [employer] Sportsman today has no headache,no chest abdominal pain,no new weakness tingling or numbness, feels much better wants to go home today.   Objective:   Blood pressure 146/65, pulse 76, temperature 98.5  F (36.9 C), temperature source Oral, resp. rate 17, height 5\' 3"  (1.6 m), weight 81 kg (178 lb 9.2 oz), SpO2 94 %. No intake or output data in the 24 hours ending 02/25/15 1139 Filed Weights   02/22/15 1756 02/23/15 0045  Weight: 81.733 kg (180 lb 3 oz) 81 kg (178 lb 9.2 oz)    Exam Awake Alert, Oriented *3, No new F.N deficits, Normal affect [place].AT,PERRAL Supple Neck,No JVD, No cervical lymphadenopathy appriciated.  Symmetrical Chest wall movement, Good air movement bilaterally, CTAB RRR,No Gallops,Rubs or new Murmurs, No Parasternal Heave +ve B.Sounds, Abd Soft, Non tender, No organomegaly appriciated, No rebound -guarding or rigidity. No Cyanosis, Clubbing or edema, No new Rash or bruise  DISCHARGE CONDITION: Stable  DISPOSITION: Home  DISCHARGE INSTRUCTIONS:    Activity:  As tolerated  Get Medicines reviewed and adjusted: Please take all your medications with you for your next visit  with your Primary MD  Please request your Primary MD to go over all hospital tests and procedure/radiological results at the follow up, please ask your Primary MD to get all Hospital records sent to his/her office.  If you experience worsening of your admission symptoms, develop shortness of breath, life threatening emergency, suicidal or homicidal thoughts you must seek medical attention immediately by calling 911 or calling your MD immediately  if symptoms less severe.  You must read complete instructions/literature along with all the possible adverse reactions/side effects for all the Medicines you take and that have been prescribed to you. Take any new Medicines after you have completely understood and accpet all the possible adverse reactions/side effects.   Do not drive when taking Pain medications.   Do not take more than prescribed Pain, Sleep and Anxiety Medications  Special Instructions: If you have smoked or chewed Tobacco  in the last 2 yrs please stop smoking, stop any regular Alcohol  and or any Recreational drug use.  Wear Seat belts while driving.  Please note  You were cared for by a hospitalist during your hospital stay. Once you are discharged, your primary care physician will handle any further medical issues. Please note that NO REFILLS for any discharge medications will be authorized once you are discharged, as it is imperative that you return to your primary care physician (or establish a relationship with a primary care physician if you do not have one) for your aftercare needs so that they can reassess your need for medications and monitor your lab values.   Diet recommendation: Diabetic Diet Heart Healthy diet  Discharge Instructions    Ambulatory referral to Neurology    Complete by:  As directed   An appointment is requested in approximately: 2 months     Diet - low sodium heart healthy    Complete by:  As directed      Diet Carb Modified    Complete by:  As  directed      Discharge instructions    Complete by:  As directed   Low sodium, Heart healthy, diabetic diet     Increase activity slowly    Complete by:  As directed      Increase activity slowly    Complete by:  As directed            Follow-up Information    Follow up with Antony Contras, MD.   Specialties:  Neurology, Radiology   Why:  They will call you with an appt date.    Contact information:   [address]  [address] [place] [address] [phone]       Follow up with Bartholome Bill, MD. Schedule an appointment as soon as possible for a visit in 2 weeks.   Specialty:  Family Medicine   Contact information:   [address] [address] [phone]       Total Time spent on discharge equals 45 minutes.  SignedOren Binet 02/25/2015 11:39 AM

## 2015-02-26 ENCOUNTER — Encounter (HOSPITAL_COMMUNITY): Payer: Self-pay | Admitting: Cardiology

## 2015-02-27 ENCOUNTER — Telehealth: Payer: Self-pay

## 2015-02-27 NOTE — Telephone Encounter (Signed)
I spoke to the patient in regards to the Respect-ESUS study. The patient is interested in participating. Therefore, a screening visit was scheduled for [id number].

## 2015-02-27 NOTE — Progress Notes (Signed)
Sarah from [facility] called and verified they received Jeanette [name] information and they accept the referral. Jeanette [name] states they will not have a PT available until Saturday. CM called and spoke with Jeanette [name] and informed her that PT would not be available until Saturday and she was agreeable. She did not want to change home health agencies at this time.

## 2015-02-27 NOTE — Progress Notes (Signed)
CM was notified that patient has not received home health PT service. CM set up home health services on 02/24/15 with [facility] and faxed information with a confirmation transmission report. Staff from [facility] also called and accepted the referral again Tuesday am. CM contacted [facility] this am and they denied they had received the referral. CM informed Sarah at [facility] that an acceptance phone call was made. CM printed the demographics, H&P, orders again and refaxed them tho [facility] at the confirmed fax number. CM will follow up with [facility] and Jeanette [name].

## 2015-03-10 ENCOUNTER — Telehealth: Payer: Self-pay

## 2015-03-10 NOTE — Telephone Encounter (Signed)
Rn receive call from patient about her resuming swimming and working out. Pt has an appt on 04-28-15 but wanted to resume her normal activities now. Rn explain that a earlier could be done if available. Rn had an appt on 03-19-15 at 0830 with Dr.Sethi that was schedule. Rn explain that there was a cancellation. Rn told patient to arrive 0815 for check in. Pt wanted a FMLA form filled out for her son. Rn explain that a form cannot be filled out until she is seen by Dr.Sethi.. Rn explain there is a release form that has to be done and there is a 25.00 charge. Pt stated she understood, and pay the fee and have her son FMLA form filled out for his job. Pt stated her son needs it because he takes of her medical appts and drives her sometimes.

## 2015-03-10 NOTE — Telephone Encounter (Signed)
Rn will also send a release form for her son to filled out

## 2015-03-10 NOTE — Telephone Encounter (Signed)
I spoke to the patient to let her know that her screening visit for the Respect-ESUS study will be rescheduled sometime next week. I also told the patient that the Research Department will be contacting her during this week to reschedule this appointment. Patient voiced understanding.

## 2015-03-11 ENCOUNTER — Telehealth: Payer: Self-pay

## 2015-03-11 NOTE — Telephone Encounter (Signed)
I spoke to the patient in regards to the Respect-ESUS study. I told her that she does not qualify for the study due to the type of stroke she had. I thanked her for her time and consideration.

## 2015-03-13 ENCOUNTER — Telehealth: Payer: Self-pay | Admitting: Neurology

## 2015-03-13 NOTE — Telephone Encounter (Signed)
Rn call patient and she stated the form for her son has the fax number and FMLA. She will bring it on Monday.

## 2015-03-13 NOTE — Telephone Encounter (Signed)
error 

## 2015-03-13 NOTE — Telephone Encounter (Signed)
Rn call patient back to state the release form has not been receive in her mail. Rn stated the release form can be mailed again.

## 2015-03-13 NOTE — Telephone Encounter (Signed)
Pt called and would like a call about forms that were to be mailed to her. She has not received them? Please call and advise

## 2015-03-19 ENCOUNTER — Ambulatory Visit (INDEPENDENT_AMBULATORY_CARE_PROVIDER_SITE_OTHER): Payer: Medicare Other | Admitting: Neurology

## 2015-03-19 ENCOUNTER — Encounter: Payer: Self-pay | Admitting: Neurology

## 2015-03-19 VITALS — BP 140/69 | HR 74 | Ht 63.0 in | Wt 188.0 lb

## 2015-03-19 DIAGNOSIS — R930 Abnormal findings on diagnostic imaging of skull and head, not elsewhere classified: Secondary | ICD-10-CM

## 2015-03-19 DIAGNOSIS — G43009 Migraine without aura, not intractable, without status migrainosus: Secondary | ICD-10-CM | POA: Diagnosis not present

## 2015-03-19 DIAGNOSIS — G43909 Migraine, unspecified, not intractable, without status migrainosus: Secondary | ICD-10-CM | POA: Insufficient documentation

## 2015-03-19 MED ORDER — TOPIRAMATE 25 MG PO TABS: 25.0000 mg | ORAL_TABLET | Freq: Two times a day (BID) | ORAL | Status: DC

## 2015-03-19 NOTE — Patient Instructions (Signed)
I had a long discussion with the patient regarding her recent admission for headaches which seem like migraine headaches and have shown some response to treatment. I recommend she continue to use tramadol or Tylenol Extra Strength for symptomatic relief and start Topamax 25 mg twice daily for migraine prophylaxis. I have discussed possible side effects with the patient advised to call me if needed. She was also asked to avoid headache triggers and maintain strict headache diary. I also recommend follow-up MRI scan of the brain with and without contrast to follow the left medial temporal lobe lesion seen on her MRI. She may resume all previous activities without restrictions. Continue Plavix for stroke prevention and strict control of diabetes with hemoglobin A1c goal below 6.5, lipids with LDL cholesterol goal below 70 mg percent. Return for follow-up in 3 months or call earlier if necessary.

## 2015-03-19 NOTE — Progress Notes (Signed)
[facility] [address] [place]. [place] [address] [phone]       OFFICE FOLLOW-UP NOTE  Jeanette. Jeanette [name] Date of Birth:  06-04-1950 Medical Record Number:  [id number]   HPI: Jeanette [name] is a 81 year  Lady seen today for first office follow-up visit following hospital admission for severe headache 3 weeks ago  A complaint bad nausea, photophobia and phonophobia.Jeanette [name] is an 64 y.o. female hx of HTN, HLD, DM presenting with 2 weeks of intermittent headache. Has associated dizziness and gait instability. Dizziness described as a sensation that the room is moving. Headaches described as a generalized pulsating type pain that comes and goes. Notes associated nausea, emesis, photo and phonophobia. Denies any visual changes. No jaw pain or fatigue. Notes recently starting on a nasal spray for allergies. Has been taking tylenol for her headache. 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 BP noted to be elevated in ED, highest noted 180/80.  Date last known well: unclear Time last known well: unclear tPA Given: no, unclear last seen well Modified Rankin: Rankin Score=0  she had a prior history of migraines which prior to admission had been inactive and this headache was felt to be migrainous and treated accordingly and she often relief. She underwent extensive evaluation for possible source of embolism but all the testing was negative. This included transthoracic echo should she which showed normal ejection fraction. MRA of the brain showed no large vessel stenosis or occlusion. Carotid ultrasound showed no extracranial stenosis. LDL cholesterol was normal at 56  mg percent. Trans- esophageal echocardiogram showed no PFO or cardiac source of embolism. ESR, ANA , angiotensin-converting enzyme and antibodies for Lyme disease were all normal. Hemoglobin A1c was  normal. She was discharged home on tapering steroids and Topamax. She states her headaches have improved but she still gets them twice a week or so. She takes Tylenol Extra Strength and if that is not effective uses tramadol which works but makes her sleepy. She has not yet started Topamax for prophylaxis. She is unable to run findings specific triggers for headaches. She wants to go back to work.  ROS:   14 system review of systems is positive for   Headache, nausea and all other systems negative PMH:  Past Medical History  Diagnosis Date  . Hypertension   . Hyperlipidemia   . Neuromuscular disorder (HCC)     neuropathy hands and feet  . Heart murmur     many years ago  . Pneumonia ~ 2010  . Type II diabetes mellitus ([place])   . Hypothyroidism     thyroidectomy 07/18/2014  . Arthritis     "back" (07/18/2014)  . Chronic back pain   . Anemia   . Cancer of left breast ([place])   . Stroke [facility])     Social History:  Social History   Social History  . Marital Status: Divorced    Spouse Name: N/A  . Number of Children: N/A  . Years of Education: N/A   Occupational History  . Not on file.   Social History Main Topics  . Smoking status: Former Smoker -- 0.25 packs/day for 5 years    Types: Cigarettes    Quit date: 05/24/1970  . Smokeless tobacco: Never Used  . Alcohol Use: No     Comment: 07/18/2014 "quit drinking in the 1980's"  . Drug Use: Yes  Special: Marijuana     Comment: occassionally way back when  . Sexual Activity: Not on file   Other Topics Concern  . Not on file   Social History Narrative    Medications:   Current Outpatient Prescriptions on File Prior to Visit  Medication Sig Dispense Refill  . atorvastatin (LIPITOR) 20 MG tablet Take 20 mg by mouth every morning.     . benzonatate (TESSALON) 200 MG capsule Take 200 mg by mouth 3 (three) times daily as needed.    . calcium-vitamin D (OSCAL WITH D) 500-200 MG-UNIT per tablet Take 2 tablets by mouth 2 (two)  times daily. 60 tablet 1  . clopidogrel (PLAVIX) 75 MG tablet Take 1 tablet (75 mg total) by mouth daily. 30 tablet 0  . fluticasone (FLONASE) 50 MCG/ACT nasal spray Place 2 sprays into both nostrils daily.    Marland Kitchen gabapentin (NEURONTIN) 300 MG capsule Take 300 mg by mouth 3 (three) times daily.    . hydrochlorothiazide (HYDRODIURIL) 12.5 MG tablet Take 12.5 mg by mouth daily.    Marland Kitchen losartan (COZAAR) 100 MG tablet Take 100 mg by mouth daily.     . metFORMIN (GLUCOPHAGE) 1000 MG tablet Take 1,000 mg by mouth 2 (two) times daily with a meal.     . methylPREDNISolone (MEDROL DOSEPAK) 4 MG TBPK tablet follow package directions 21 tablet 0  . Multiple Vitamins-Minerals (MULTIVITAMIN WITH MINERALS) tablet Take 1 tablet by mouth daily.     . pioglitazone (ACTOS) 45 MG tablet Take 45 mg by mouth daily.    Marland Kitchen SYNTHROID 88 MCG tablet Take 1 tablet (88 mcg total) by mouth daily before breakfast. 30 tablet 3  . traMADol (ULTRAM) 50 MG tablet Take 1 tablet (50 mg total) by mouth daily as needed for moderate pain. 30 tablet 0   No current facility-administered medications on file prior to visit.    Allergies:  No Known Allergies  Physical Exam General: well developed, well nourished  Middle aged lady, seated, in no evident distress Head: head normocephalic and atraumatic.  Neck: supple with no carotid or supraclavicular bruits Cardiovascular: regular rate and rhythm, no murmurs Musculoskeletal: no deformity Skin:  no rash/petichiae Vascular:  Normal pulses all extremities Filed Vitals:   03/19/15 0832  BP: 140/69  Pulse: 74   Neurologic Exam Mental Status: Awake and fully alert. Oriented to place and time. Recent and remote memory intact. Attention span, concentration and fund of knowledge appropriate. Mood and affect appropriate.  Cranial Nerves: Fundoscopic exam reveals sharp disc margins. Pupils equal, briskly reactive to light. Extraocular movements full without nystagmus. Visual fields full to  confrontation. Hearing intact. Facial sensation intact. Face, tongue, palate moves normally and symmetrically.  Motor: Normal bulk and tone. Normal strength in all tested extremity muscles. Sensory.: intact to touch ,pinprick .position and vibratory sensation.  Coordination: Rapid alternating movements normal in all extremities. Finger-to-nose and heel-to-shin performed accurately bilaterally. Gait and Station: Arises from chair without difficulty. Stance is normal. Gait demonstrates normal stride length and balance . Able to heel, toe and tandem walk without difficulty.  Reflexes: 1+ and symmetric. Toes downgoing.   NIHSS  0 Modified Rankin  1   ASSESSMENT: 89 year  [place] with sudden onset of severe headache in 3 weeks ago likely migraines with an abnormal MRI scan showing tiny left medial temporal lobe diffusion hyperintensity possibly small embolic infarct versus tumor or seizure or migraine related abnormality. Workup for  Source of Embolic stroke has been unyielding.  PLAN: I had a long discussion with the patient regarding her recent admission for headaches which seem like migraine headaches and have shown some response to treatment. I recommend she continue to use tramadol or Tylenol Extra Strength for symptomatic relief and start Topamax 25 mg twice daily for migraine prophylaxis. I have discussed possible side effects with the patient advised to call me if needed. She was also asked to avoid headache triggers and maintain strict headache diary. I also recommend follow-up MRI scan of the brain with and without contrast to follow the left medial temporal lobe lesion seen on her MRI. She may resume all previous activities without restrictions. Continue Plavix for stroke prevention and strict control of diabetes with hemoglobin A1c goal below 6.5, lipids with LDL cholesterol goal below 70 mg percent.Greater than 50% of time during this 25 minute visit was spent on counseling,explanation of  diagnosis, planning of further management, discussion with patient and family and coordination of care  Return for follow-up in 3 months or call earlier if necessary. Antony Contras, MD  Note: This document was prepared with digital dictation and possible smart phrase technology. Any transcriptional errors that result from this process are unintentional

## 2015-03-28 ENCOUNTER — Ambulatory Visit
Admission: RE | Admit: 2015-03-28 | Discharge: 2015-03-28 | Disposition: A | Discharge status: Home / Self Care | Payer: Medicare Other | Source: Ambulatory Visit | Attending: Neurology | Admitting: Neurology

## 2015-03-28 DIAGNOSIS — R930 Abnormal findings on diagnostic imaging of skull and head, not elsewhere classified: Secondary | ICD-10-CM | POA: Diagnosis not present

## 2015-03-28 DIAGNOSIS — G43009 Migraine without aura, not intractable, without status migrainosus: Secondary | ICD-10-CM | POA: Diagnosis not present

## 2015-03-28 MED ORDER — GADOBENATE DIMEGLUMINE 529 MG/ML IV SOLN
17.0000 mL | Freq: Once | INTRAVENOUS | Status: AC | PRN
  Administered 2015-03-28: 17 mL via INTRAVENOUS

## 2015-04-01 ENCOUNTER — Telehealth: Payer: Self-pay

## 2015-04-01 NOTE — Telephone Encounter (Signed)
-----   Message from Garvin Fila, MD sent at 04/01/2015  8:35 AM EST ----- Kindly inform  the patient that MRI scan of the brain is normal. The previously seen abnormality is no  longer seen.

## 2015-04-01 NOTE — Telephone Encounter (Signed)
Rn informed patient that her MRI scan of the brain was normal. Rn explain that per Dr.Sethi notes the previous mri seen abnormal is no longer seen. Pt verbalized understanding of the test results.

## 2015-04-02 ENCOUNTER — Other Ambulatory Visit: Payer: Self-pay

## 2015-04-02 DIAGNOSIS — I6389 Other cerebral infarction: Secondary | ICD-10-CM

## 2015-04-02 MED ORDER — CLOPIDOGREL BISULFATE 75 MG PO TABS: 75.0000 mg | ORAL_TABLET | Freq: Every day | ORAL | Status: DC

## 2015-04-02 NOTE — Telephone Encounter (Signed)
Rn talk to Dr. Leonie Man about patient wanting 90 day refill. Dr.Sethi stated to give refill.

## 2015-04-14 DIAGNOSIS — K5909 Other constipation: Secondary | ICD-10-CM | POA: Insufficient documentation

## 2015-04-28 ENCOUNTER — Ambulatory Visit: Payer: Self-pay | Admitting: Neurology

## 2015-07-09 ENCOUNTER — Ambulatory Visit (INDEPENDENT_AMBULATORY_CARE_PROVIDER_SITE_OTHER): Payer: Medicare Other | Admitting: Neurology

## 2015-07-09 ENCOUNTER — Encounter: Payer: Self-pay | Admitting: Neurology

## 2015-07-09 VITALS — BP 114/63 | HR 72 | Ht 63.0 in | Wt 185.0 lb

## 2015-07-09 DIAGNOSIS — G44209 Tension-type headache, unspecified, not intractable: Secondary | ICD-10-CM

## 2015-07-09 MED ORDER — TOPIRAMATE 50 MG PO TABS: 50.0000 mg | ORAL_TABLET | Freq: Two times a day (BID) | ORAL | Status: DC

## 2015-07-09 NOTE — Progress Notes (Signed)
[facility] [address] [place]. [place] [address] [phone]       OFFICE FOLLOW-UP NOTE  Jeanette. Jeanette [name] Date of Birth:  10-02-1950 Medical Record Number:  [id number]   HPI: Jeanette [name] is a 36 year  Lady seen today for first office follow-up visit following hospital admission for severe headache 3 weeks ago  A complaint bad nausea, photophobia and phonophobia.Jeanette [name] is an 65 y.o. female hx of HTN, HLD, DM presenting with 2 weeks of intermittent headache. Has associated dizziness and gait instability. Dizziness described as a sensation that the room is moving. Headaches described as a generalized pulsating type pain that comes and goes. Notes associated nausea, emesis, photo and phonophobia. Denies any visual changes. No jaw pain or fatigue. Notes recently starting on a nasal spray for allergies. Has been taking tylenol for her headache. 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 BP noted to be elevated in ED, highest noted 180/80.  Date last known well: unclear Time last known well: unclear tPA Given: no, unclear last seen well Modified Rankin: Rankin Score=0  she had a prior history of migraines which prior to admission had been inactive and this headache was felt to be migrainous and treated accordingly and she often relief. She underwent extensive evaluation for possible source of embolism but all the testing was negative. This included transthoracic echo should she which showed normal ejection fraction. MRA of the brain showed no large vessel stenosis or occlusion. Carotid ultrasound showed no extracranial stenosis. LDL cholesterol was normal at 56  mg percent. Trans- esophageal echocardiogram showed no PFO or cardiac source of embolism. ESR, ANA , angiotensin-converting enzyme and antibodies for Lyme disease were all normal. Hemoglobin A1c was  normal. She was discharged home on tapering steroids and Topamax. She states her headaches have improved but she still gets them twice a week or so. She takes Tylenol Extra Strength and if that is not effective uses tramadol which works but makes her sleepy. She has not yet started Topamax for prophylaxis. She is unable to run findings specific triggers for headaches. She wants to go back to work. Update 07/09/2015 :  She returns for follow-up after last visit 3 months ago. She initially noted some improvement in her headaches after starting Topamax 25 twice a day but now today she is having daily headaches. These can last up to a few hours of almost the whole day. These are moderate in intensity bifrontal pressure-like sensation and feeling of heaviness. There is no accompanying nausea, vomiting, light or sound sensitivity. She does take tramadol but mostly at night to help her sleep. She has Regularly and goes to the gym at least twice a week. She does admit that she has some neck muscle tension and there has been some stress going on in her household. She has some subjective memory complaints but on Mini-Mental status testing today in office she scored 30/30 without any deficits. She had follow-up MRI scan of the brain done on 03/30/15 which I personally reviewed and shows resolution of the left medial temporal lobe hippocampal diffusion-positive hyperintensity which is no longer seen. ROS:   14 system review of systems is positive for   Headache,  fatigue, eye itching, blurred vision, shortness of breath, leg swelling, constipation, memory loss, joint pain, back pain, confusion, decreased concentration and all other systems negative PMH:  Past Medical History  Diagnosis Date  .  Hypertension   . Hyperlipidemia   . Neuromuscular disorder (HCC)     neuropathy hands and feet  . Heart murmur     many years ago  . Pneumonia ~ 2010  . Type II diabetes mellitus ([place])   . Hypothyroidism     thyroidectomy  07/18/2014  . Arthritis     "back" (07/18/2014)  . Chronic back pain   . Anemia   . Cancer of left breast ([place])   . Stroke ([place])   . Headache     Social History:  Social History   Social History  . Marital Status: Divorced    Spouse Name: N/A  . Number of Children: N/A  . Years of Education: N/A   Occupational History  . Not on file.   Social History Main Topics  . Smoking status: Former Smoker -- 0.25 packs/day for 5 years    Types: Cigarettes    Quit date: 05/24/1970  . Smokeless tobacco: Never Used  . Alcohol Use: No     Comment: 07/18/2014 "quit drinking in the 1980's"  . Drug Use: Yes    Special: Marijuana     Comment: occassionally way back when  . Sexual Activity: Not on file   Other Topics Concern  . Not on file   Social History Narrative    Medications:   Current Outpatient Prescriptions on File Prior to Visit  Medication Sig Dispense Refill  . ACCU-CHEK AVIVA PLUS test strip     . atorvastatin (LIPITOR) 20 MG tablet Take 20 mg by mouth every morning.     . calcium-vitamin D (OSCAL WITH D) 500-200 MG-UNIT per tablet Take 2 tablets by mouth 2 (two) times daily. 60 tablet 1  . clopidogrel (PLAVIX) 75 MG tablet Take 1 tablet (75 mg total) by mouth daily. 90 tablet 3  . gabapentin (NEURONTIN) 300 MG capsule Take 300 mg by mouth 3 (three) times daily.    . hydrochlorothiazide (HYDRODIURIL) 12.5 MG tablet Take 12.5 mg by mouth daily.    Marland Kitchen losartan (COZAAR) 100 MG tablet Take 100 mg by mouth daily.     . metFORMIN (GLUCOPHAGE) 1000 MG tablet Take 1,000 mg by mouth 2 (two) times daily with a meal.     . Multiple Vitamins-Minerals (MULTIVITAMIN WITH MINERALS) tablet Take 1 tablet by mouth daily.     . pioglitazone (ACTOS) 45 MG tablet Take 45 mg by mouth daily.    Marland Kitchen SYNTHROID 88 MCG tablet Take 1 tablet (88 mcg total) by mouth daily before breakfast. 30 tablet 3  . traMADol (ULTRAM) 50 MG tablet Take 1 tablet (50 mg total) by mouth daily as needed for moderate pain.  30 tablet 0   No current facility-administered medications on file prior to visit.    Allergies:  No Known Allergies  Physical Exam General: well developed, well nourished  Middle aged lady, seated, in no evident distress Head: head normocephalic and atraumatic.  Neck: supple with no carotid or supraclavicular bruits Cardiovascular: regular rate and rhythm, no murmurs Musculoskeletal: no deformity Skin:  no rash/petichiae Vascular:  Normal pulses all extremities Filed Vitals:   07/09/15 1037  BP: 114/63  Pulse: 72   Neurologic Exam Mental Status: Awake and fully alert. Oriented to place and time. Recent and remote memory intact. Attention span, concentration and fund of knowledge appropriate. Mood and affect appropriate. Mini-Mental status exam 30/30. No deficits. Animal naming 11. Clock drawing 4/4. Geriatric depression scale 0 not depressed. Cranial Nerves: Fundoscopic exam reveals sharp disc  margins. Pupils equal, briskly reactive to light. Extraocular movements full without nystagmus. Visual fields full to confrontation. Hearing intact. Facial sensation intact. Face, tongue, palate moves normally and symmetrically.  Motor: Normal bulk and tone. Normal strength in all tested extremity muscles. Sensory.: intact to touch ,pinprick .position and vibratory sensation.  Coordination: Rapid alternating movements normal in all extremities. Finger-to-nose and heel-to-shin performed accurately bilaterally. Gait and Station: Arises from chair without difficulty. Stance is normal. Gait demonstrates normal stride length and balance . Able to heel, toe and tandem walk without difficulty.  Reflexes: 1+ and symmetric. Toes downgoing.   NIHSS  0 Modified Rankin  1   ASSESSMENT: 89 year  [place] with sudden onset of severe headache in 3 weeks ago likely migraines with an abnormal MRI scan showing  Transient tiny left medial temporal lobe diffusion hyperintensity of unclear significance which is now  resolved on follow-up MRI scan. Workup for  Source of Embolic stroke has been unyielding. Persistent daily headaches likely tension headaches    PLAN: I had a long discussion with the patient with regards to her headaches which now seem like muscle tension headaches. I recommend she do regular neck stretching exercises as well as increased perspiration and regular activities for stress relaxation. Increase Topamax dose to 50 mg twice daily. I also discussed with her the results of the follow-up MRI scan which now looks normal. She will return for follow-up in 3 months with Cecille Rubin, nurse practitioner or call earlier if necessary.   Antony Contras, MD  Note: This document was prepared with digital dictation and possible smart phrase technology. Any transcriptional errors that result from this process are unintentional

## 2015-07-09 NOTE — Patient Instructions (Signed)
I had a long discussion with the patient with regards to her headaches which now seem like muscle tension headaches. I recommend she do regular neck stretching exercises as well as increased perspiration and regular activities for stress relaxation. Increase Topamax dose to 50 g twice daily. I also discussed with her the results of the follow-up MRI scan which now looks normal. She will return for follow-up in 3 months with Cecille Rubin, nurse practitioner or call earlier if necessary.  Tension Headache A tension headache is a feeling of pain, pressure, or aching that is often felt over the front and sides of the head. The pain can be dull, or it can feel tight (constricting). Tension headaches are not normally associated with nausea or vomiting, and they do not get worse with physical activity. Tension headaches can last from 30 minutes to several days. This is the most common type of headache. CAUSES The exact cause of this condition is not known. Tension headaches often begin after stress, anxiety, or depression. Other triggers may include:  Alcohol.  Too much caffeine, or caffeine withdrawal.  Respiratory infections, such as colds, flu, or sinus infections.  Dental problems or teeth clenching.  Fatigue.  Holding your head and neck in the same position for a long period of time, such as while using a computer.  Smoking. SYMPTOMS Symptoms of this condition include:  A feeling of pressure around the head.  Dull, aching head pain.  Pain felt over the front and sides of the head.  Tenderness in the muscles of the head, neck, and shoulders. DIAGNOSIS This condition may be diagnosed based on your symptoms and a physical exam. Tests may be done, such as a CT scan or an MRI of your head. These tests may be done if your symptoms are severe or unusual. TREATMENT This condition may be treated with lifestyle changes and medicines to help relieve symptoms. HOME CARE INSTRUCTIONS Managing  Pain  Take over-the-counter and prescription medicines only as told by your health care provider.  Lie down in a dark, quiet room when you have a headache.  If directed, apply ice to the head and neck area:  Put ice in a plastic bag.  Place a towel between your skin and the bag.  Leave the ice on for 20 minutes, 2-3 times per day.  Use a heating pad or a hot shower to apply heat to the head and neck area as told by your health care provider. Eating and Drinking  Eat meals on a regular schedule.  Limit alcohol use.  Decrease your caffeine intake, or stop using caffeine. General Instructions  Keep all follow-up visits as told by your health care provider. This is important.  Keep a headache journal to help find out what may trigger your headaches. For example, write down:  What you eat and drink.  How much sleep you get.  Any change to your diet or medicines.  Try massage or other relaxation techniques.  Limit stress.  Sit up straight, and avoid tensing your muscles.  Do not use tobacco products, including cigarettes, chewing tobacco, or e-cigarettes. If you need help quitting, ask your health care provider.  Exercise regularly as told by your health care provider.  Get 7-9 hours of sleep, or the amount recommended by your health care provider. SEEK MEDICAL CARE IF:  Your symptoms are not helped by medicine.  You have a headache that is different from what you normally experience.  You have nausea or you vomit.  You have a fever. SEEK IMMEDIATE MEDICAL CARE IF:  Your headache becomes severe.  You have repeated vomiting.  You have a stiff neck.  You have a loss of vision.  You have problems with speech.  You have pain in your eye or ear.  You have muscular weakness or loss of muscle control.  You lose your balance or you have trouble walking.  You feel faint or you pass out.  You have confusion.   This information is not intended to replace  advice given to you by your health care provider. Make sure you discuss any questions you have with your health care provider.   Document Released: 05/10/2005 Document Revised: 01/29/2015 Document Reviewed: 09/02/2014 Elsevier Interactive Patient Education [employer].

## 2015-08-06 IMAGING — CT CT CHEST W/O CM
2 of 4 series · 15 of 36 positions shown, 18 images · non-contrast
Comparison: 04/05/2014

CLINICAL DATA: History of left breast cancer 3553, status post left
mastectomy, radiation chemotherapy. Left chest pulmonary nodule by
recent chest x-ray

EXAM:
CT CHEST WITHOUT CONTRAST
TECHNIQUE: Multidetector CT imaging of the chest was performed following the
standard protocol without IV contrast..

[Series 2: chest w/o st · axial · non-contrast · 0.74mm/px · z∈[+1032,+1252]mm · 12 of 52 slices shown, 15 images]
[im 4/52  mediastinal]
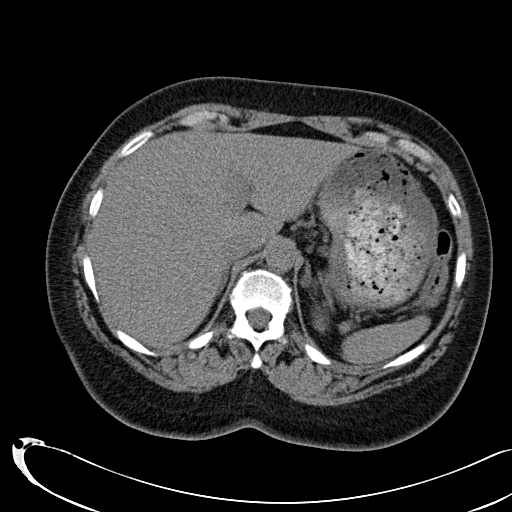
[im 4/52  lung]
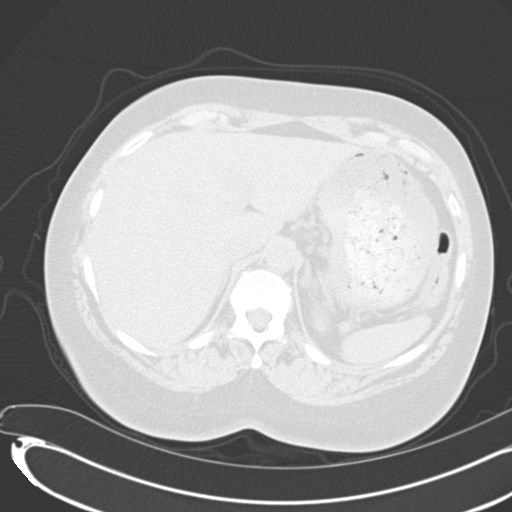
[im 8/52  lung]
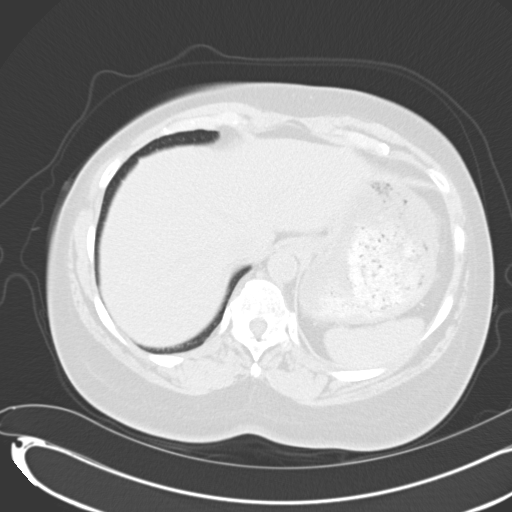
[im 11/52  lung]
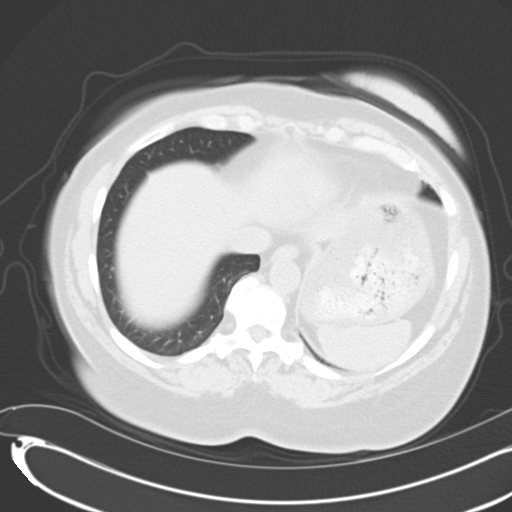
[im 15/52  lung]
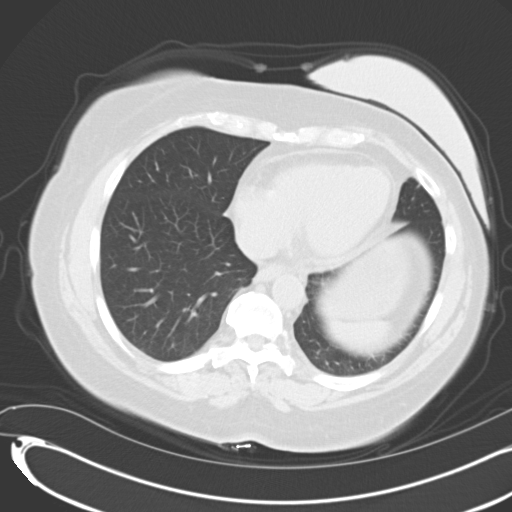
[im 19/52  mediastinal]
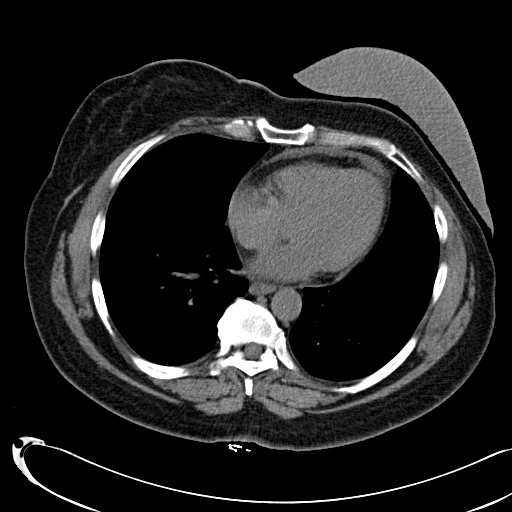
[im 19/52  lung]
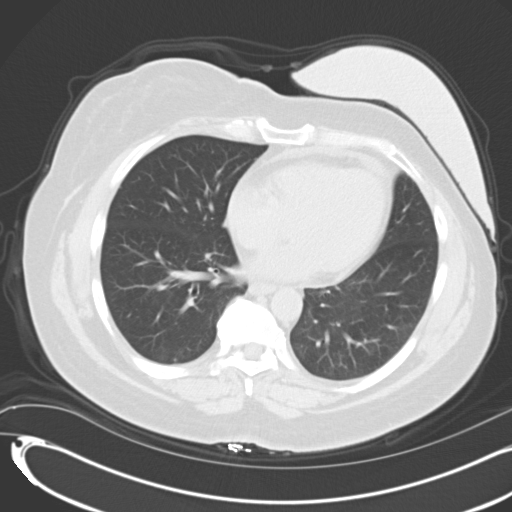
[im 22/52  lung]
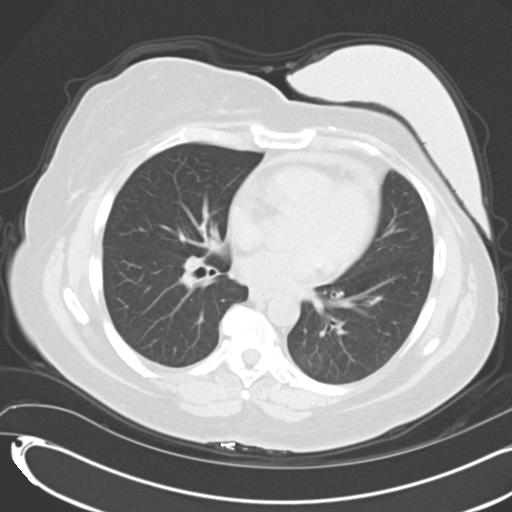
[im 30/52  lung]
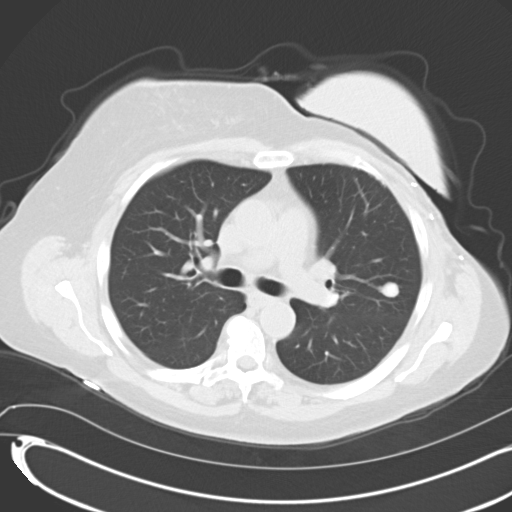
[im 33/52  lung]
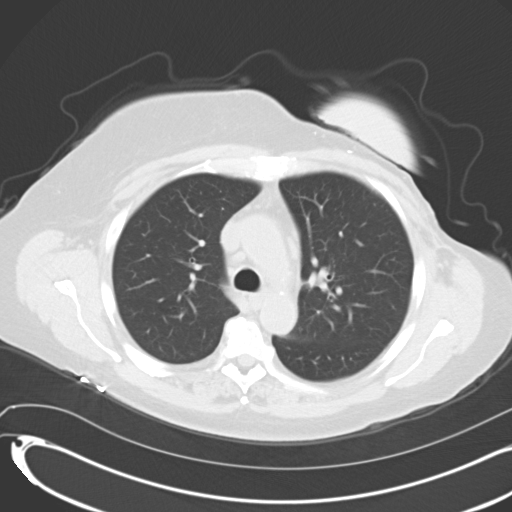
[im 37/52  mediastinal]
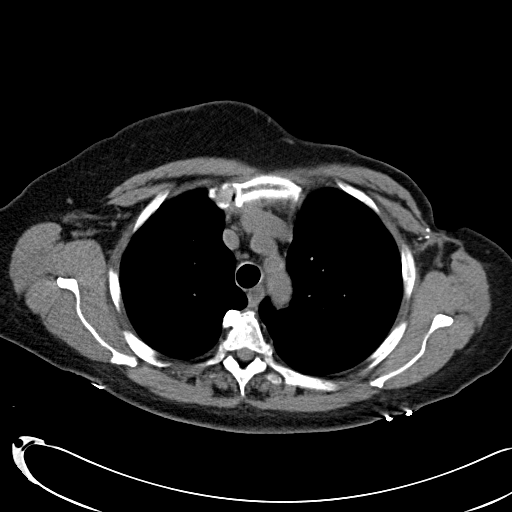
[im 37/52  lung]
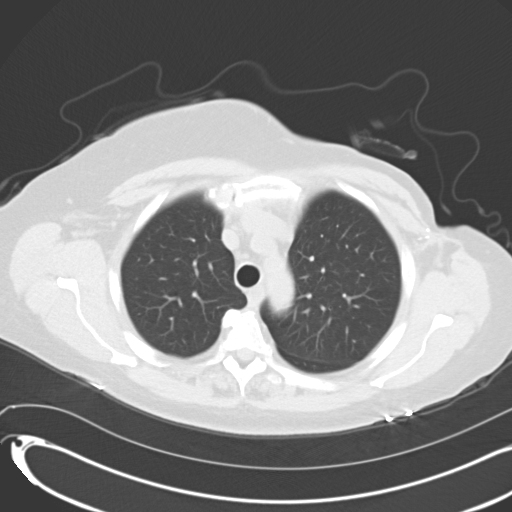
[im 41/52  lung]
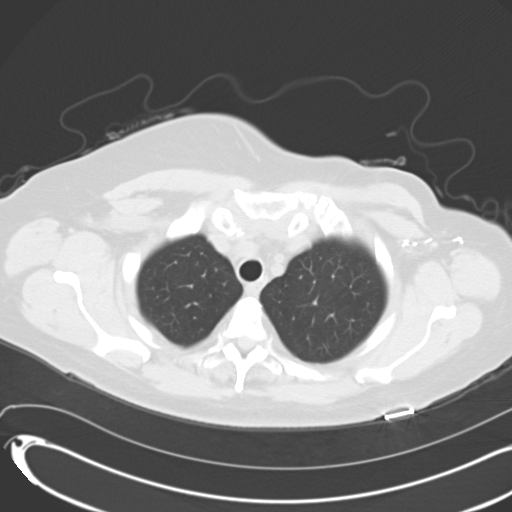
[im 44/52  lung]
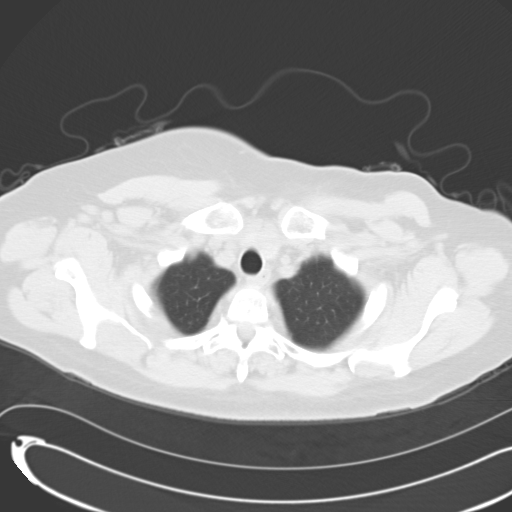
[im 48/52  lung]
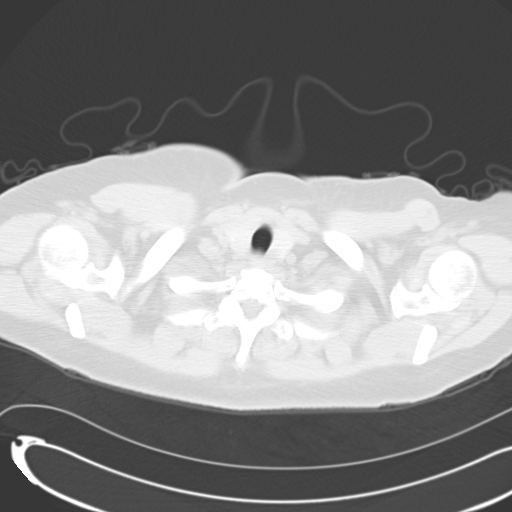

[Series 602: <mpr thick range> · coronal · 0.74mm/px · 3 of 92 slices shown]
[im 19/92  lung]
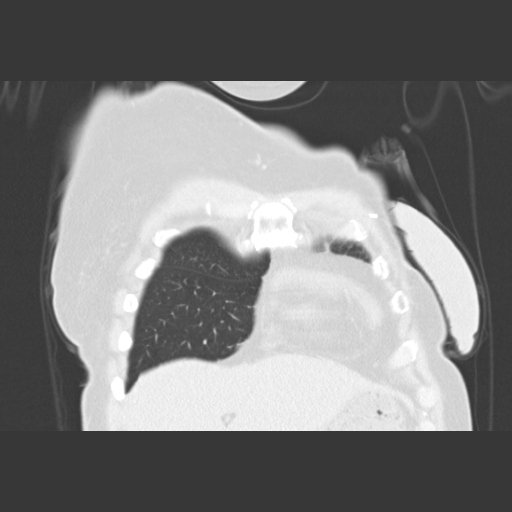
[im 37/92  lung]
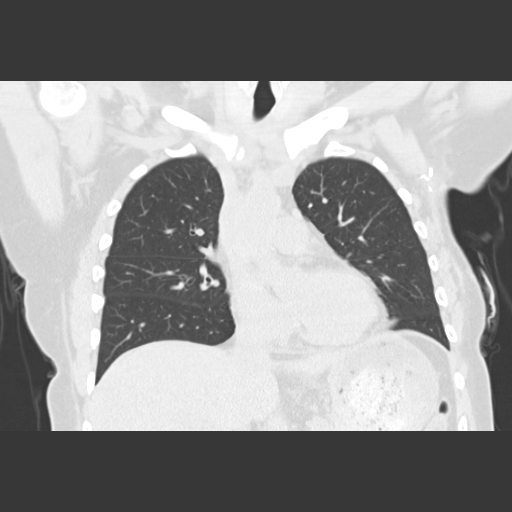
[im 55/92  lung]
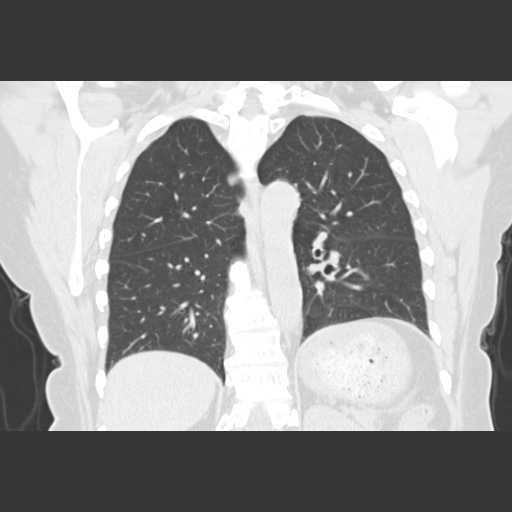

[15 of 36 positions shown; findings below may reference images not displayed]

FINDINGS: Previous left mastectomy and axillary lymph node dissection noted.
Left chest external breast prosthesis in place.

Left upper lobe smoothly marginated calcified nodule correlates with
the chest radiograph finding measuring 13 x 12 mm compatible with a
granuloma. Left hilar calcified mediastinal nodes also evident,
images 25 and 27. Findings compatible with remote granulomatous
disease. Additional scattered mediastinal and hilar nodes noted
without significant adenopathy. No other suspicious pulmonary nodule
or mass to suggest metastatic disease. Right lung remains clear.

No focal pneumonia, collapse or consolidation.

No interstitial edema, fibrosis, or central airway abnormality.
Trachea and central airways are patent.

Normal heart size. Slight pericardial thickening noted without
significant effusion. No pleural effusion.

Included upper abdomen demonstrates no acute process.

Degenerative change present of the spine with osteophytes on the
right.
IMPRESSION: Left upper lobe and left hilar calcified granulomatous disease
correlates with the chest radiograph finding.

No evidence of metastatic disease.

Previous left mastectomy and axillary lymph node dissection.

## 2015-08-12 ENCOUNTER — Telehealth: Payer: Self-pay | Admitting: Neurology

## 2015-08-12 ENCOUNTER — Other Ambulatory Visit: Payer: Self-pay | Admitting: Neurology

## 2015-08-12 MED ORDER — TOPIRAMATE 50 MG PO TABS: 100.0000 mg | ORAL_TABLET | Freq: Two times a day (BID) | ORAL | Status: DC

## 2015-08-12 NOTE — Telephone Encounter (Signed)
Rn call patient and her insurance will not pay for topamax until April 2017. Pt stated Dr.Sethi told her to increase to 2 tablets twice a day.Message was sent to [place].

## 2015-08-12 NOTE — Telephone Encounter (Signed)
Rn call patient to let her know [facility] stated her prescription was ready for pick up. The insurance did approve it.

## 2015-08-12 NOTE — Telephone Encounter (Signed)
New dosage sent to [facility] for Topamax 2 pills twice a day.

## 2015-08-12 NOTE — Telephone Encounter (Signed)
Ok done

## 2015-08-12 NOTE — Telephone Encounter (Signed)
Pt is requesting a refill on topiramate (TOPAMAX) 50 MG tablet, she was told to take 2 pills 2x day and the current prescription does not say that. Please call [phone]

## 2015-08-14 ENCOUNTER — Encounter: Payer: Self-pay | Admitting: Neurology

## 2015-08-14 ENCOUNTER — Ambulatory Visit (INDEPENDENT_AMBULATORY_CARE_PROVIDER_SITE_OTHER): Payer: Self-pay | Admitting: Neurology

## 2015-08-14 ENCOUNTER — Ambulatory Visit (INDEPENDENT_AMBULATORY_CARE_PROVIDER_SITE_OTHER): Payer: Medicare Other | Admitting: Neurology

## 2015-08-14 DIAGNOSIS — G5601 Carpal tunnel syndrome, right upper limb: Secondary | ICD-10-CM

## 2015-08-14 DIAGNOSIS — G5603 Carpal tunnel syndrome, bilateral upper limbs: Secondary | ICD-10-CM

## 2015-08-14 DIAGNOSIS — G5602 Carpal tunnel syndrome, left upper limb: Secondary | ICD-10-CM | POA: Diagnosis not present

## 2015-08-14 DIAGNOSIS — G43009 Migraine without aura, not intractable, without status migrainosus: Secondary | ICD-10-CM

## 2015-08-14 NOTE — Progress Notes (Signed)
Please refer to EMG and nerve conduction study procedure note. 

## 2015-08-14 NOTE — Procedures (Signed)
     HISTORY:  Jeanette [name] is a 65 year old patient with a history of diabetes and thyroid disease with a prior history of carpal tunnel syndrome status post surgery bilaterally in the past. The patient has had a greater than six-month history of recurrence of symptoms of numbness of the hands. She is being evaluated for a possible neuropathy or a cervical radiculopathy. She denies any neck pain or pain radiating down the arms.  NERVE CONDUCTION STUDIES:  Nerve conduction studies were performed on both upper extremities. The distal motor latencies for the median nerves were prolonged bilaterally with normal motor amplitudes for these nerves bilaterally. The distal motor latencies and motor amplitudes for the ulnar nerves were normal bilaterally. The F wave latencies and nerve conduction velocities for the median and ulnar nerves were normal bilaterally. The sensory latencies for the median nerves were prolonged bilaterally, normal for the ulnar nerves bilaterally.  EMG STUDIES:  EMG study was performed on the left upper extremity:  The first dorsal interosseous muscle reveals 2 to 4 K units with full recruitment. No fibrillations or positive waves were noted. The abductor pollicis brevis muscle reveals 2 to 5 K units with significantly decreased recruitment. No fibrillations or positive waves were noted. The extensor indicis proprius muscle reveals 1 to 3 K units with full recruitment. No fibrillations or positive waves were noted. The pronator teres muscle reveals 2 to 3 K units with full recruitment. No fibrillations or positive waves were noted. The biceps muscle reveals 1 to 2 K units with full recruitment. No fibrillations or positive waves were noted. The triceps muscle reveals 2 to 4 K units with full recruitment. No fibrillations or positive waves were noted. The anterior deltoid muscle reveals 2 to 3 K units with full recruitment. No fibrillations or positive waves were noted. The  cervical paraspinal muscles were tested at 2 levels. No abnormalities of insertional activity were seen at either level tested. There was good relaxation.  A limited EMG study was performed on the right upper extremity:  The first dorsal interosseous muscle reveals 2 to 4 K units with full recruitment. No fibrillations or positive waves were noted. The abductor pollicis brevis muscle reveals 2 to 5 K units with decreased recruitment. No fibrillations or positive waves were noted.  The extensor indicis proprius muscle reveals 1 to 3 K motor units with full recruitment. No fibrillations or positive waves were seen.   IMPRESSION:  Nerve conduction studies done on both upper extremities show evidence of mild bilateral carpal tunnel syndrome. EMG evaluation of the left upper extremity shows findings consistent with a chronic stable carpal tunnel syndrome, with no evidence of an overlying cervical radiculopathy. A limited EMG study of the right upper extremity shows findings consistent with a chronic stable carpal tunnel syndrome.  Jeanette Alexanders MD 08/14/2015 1:46 PM  [facility] [address] [place], [place] [address]  Phone [phone] Fax [phone]

## 2015-09-03 DIAGNOSIS — G8929 Other chronic pain: Secondary | ICD-10-CM | POA: Insufficient documentation

## 2015-10-08 ENCOUNTER — Ambulatory Visit: Payer: Medicare Other | Admitting: Nurse Practitioner

## 2015-10-27 ENCOUNTER — Telehealth: Payer: Self-pay | Admitting: Nurse Practitioner

## 2015-10-27 NOTE — Telephone Encounter (Signed)
Left message for patient re 7/10 LTS visit. Schedule mailed.  °

## 2015-11-27 ENCOUNTER — Telehealth: Payer: Self-pay | Admitting: Hematology and Oncology

## 2015-11-27 NOTE — Telephone Encounter (Signed)
Pt called to cx appt....the patient ok and aware to call back when ready to r/s

## 2015-12-01 ENCOUNTER — Encounter: Payer: Medicare Other | Admitting: Nurse Practitioner

## 2015-12-03 ENCOUNTER — Other Ambulatory Visit: Payer: Self-pay

## 2015-12-03 ENCOUNTER — Other Ambulatory Visit: Payer: Self-pay | Admitting: Hematology and Oncology

## 2015-12-03 DIAGNOSIS — M79621 Pain in right upper arm: Secondary | ICD-10-CM

## 2015-12-03 DIAGNOSIS — C50412 Malignant neoplasm of upper-outer quadrant of left female breast: Secondary | ICD-10-CM

## 2015-12-03 NOTE — Progress Notes (Signed)
Received VM x 2 from pt stating she attempted to schedule mammogram.  Pt states [facility] that [facility] needed a diagnostic mammogram ordered d/t complaints of axillary pain.  I contacted [facility] to confirm what all was necessary for pt to be able to schedule images and orders entered.  Called pt back and informed her the correct orders were in for her to schedule at her convenience.  Pt without further questions or concerns at time of call.

## 2015-12-04 ENCOUNTER — Other Ambulatory Visit: Payer: Self-pay | Admitting: Hematology and Oncology

## 2015-12-04 DIAGNOSIS — C50412 Malignant neoplasm of upper-outer quadrant of left female breast: Secondary | ICD-10-CM

## 2015-12-04 DIAGNOSIS — N644 Mastodynia: Secondary | ICD-10-CM

## 2015-12-09 ENCOUNTER — Ambulatory Visit
Admission: RE | Admit: 2015-12-09 | Discharge: 2015-12-09 | Disposition: A | Discharge status: Home / Self Care | Payer: Medicare Other | Source: Ambulatory Visit | Attending: Hematology and Oncology | Admitting: Hematology and Oncology

## 2015-12-09 DIAGNOSIS — C50412 Malignant neoplasm of upper-outer quadrant of left female breast: Secondary | ICD-10-CM

## 2015-12-09 DIAGNOSIS — N644 Mastodynia: Secondary | ICD-10-CM

## 2015-12-17 ENCOUNTER — Other Ambulatory Visit: Payer: Self-pay

## 2015-12-17 ENCOUNTER — Telehealth: Payer: Self-pay | Admitting: Neurology

## 2015-12-17 MED ORDER — TOPIRAMATE 50 MG PO TABS: 100.0000 mg | ORAL_TABLET | Freq: Two times a day (BID) | ORAL | 2 refills | Status: DC

## 2015-12-17 NOTE — Telephone Encounter (Signed)
Refill done for 90 days for 2 refills.

## 2015-12-17 NOTE — Telephone Encounter (Signed)
Patient called to request refill of topiramate (TOPAMAX) 50 MG tablet to [facility].

## 2015-12-25 NOTE — Telephone Encounter (Signed)
Patient is calling to discuss the side effects of medication topiramate (TOPAMAX) 50 MG tablet.

## 2015-12-26 NOTE — Telephone Encounter (Signed)
I called patient's listed number at [phone] and got message that this number is changed and was unable to leave a message

## 2015-12-30 NOTE — Telephone Encounter (Signed)
Rn call patient back about the Topamax side effects.Pt stated she is having hair loss at the of her head. She is having some appetite issues where she has loss weight. She is having dizzy spells. PT stated according the pamphlet these are the side affects of the topamax. PT stated she even move to the first floor apartment. Pt is still having headaches. Pt has been on topamax for the last 8 months. Rn stated a message will be sent to [facility]. Pt verbalized understanding.

## 2016-01-05 ENCOUNTER — Telehealth: Payer: Self-pay | Admitting: Neurology

## 2016-01-05 ENCOUNTER — Other Ambulatory Visit: Payer: Self-pay | Admitting: Neurology

## 2016-01-05 MED ORDER — DIVALPROEX SODIUM ER 500 MG PO TB24: 500.0000 mg | ORAL_TABLET | Freq: Every day | ORAL | 3 refills | Status: DC

## 2016-01-05 NOTE — Telephone Encounter (Signed)
Spoke with Dr.Sethi about pt having side effects of topamax. Pt has been on for over 9 months. Dr.Sethi reviewed the chart and stated pt needs to stop taking medication for a week and call back in a week. Pt needs to call back in a week to see if the topamax is causing the sympts.This is per Dr.Sethi advice.

## 2016-01-05 NOTE — Telephone Encounter (Signed)
Ok will prescribe depakote er 500 mg daily instead

## 2016-01-05 NOTE — Telephone Encounter (Signed)
LFt vm for patient that Dr. Leonie Man prescribed depakote daily for headaches. Pt was having major side effects on the topamax. Rn requested pt call back to see NP or Dr. Leonie Man for 2 month follow up.

## 2016-01-05 NOTE — Telephone Encounter (Signed)
Pt called in complaining of frequent headaches. I offered to make appt.Pt said she did not have $40 for co-pay and just wanted the office to know.

## 2016-01-05 NOTE — Telephone Encounter (Signed)
LFt vm to call back about stop taking the topamax. Also lvm for her to call back that Dr. Leonie Man prescribed depakote for headaches. Pt needs a 2 month evaluation to see how the medication is helping.

## 2016-01-06 NOTE — Telephone Encounter (Signed)
Rn spoke to patient to schedule a one month follow up with Dr.Sethi. Pt stated she already pick up the depakote yesterday that was prescribe by Dr.Sethi. Rn explain when Dr. Leonie Man prescribed new meds, pt needs to follow up within 1 to 59months for evaluation. Rn stated to patient to have co payment upon appointment. Rn stated to continue refills for the depakote she has to follow up with Dr.Sethi . Pt verbalized understanding and schedule appt with Dr.Sethi.

## 2016-01-15 DIAGNOSIS — R519 Headache, unspecified: Secondary | ICD-10-CM | POA: Insufficient documentation

## 2016-02-06 ENCOUNTER — Telehealth: Payer: Self-pay

## 2016-02-06 ENCOUNTER — Ambulatory Visit: Payer: Self-pay | Admitting: Neurology

## 2016-02-06 NOTE — Telephone Encounter (Addendum)
Per last note pt needs to be schedule with Carolyn(NP) for follow up appt. Pt was last seen 06/2015 and has cancel two appointments. Pt needs appt before the end of October 2017. Pt was prescribed new medication for headache and needs to be evaluated.Rn left vm.

## 2016-02-06 NOTE — Telephone Encounter (Deleted)
dd

## 2016-02-09 NOTE — Telephone Encounter (Signed)
Pt returned RN's call. An appt was scheduled with Hoyle Sauer for 9/22.

## 2016-02-09 NOTE — Telephone Encounter (Signed)
LFt vm for patient to call back to r/s appt. Pt needs to be schedule with Carolyn(NP) within 4 to 6 weeks.

## 2016-02-13 ENCOUNTER — Ambulatory Visit (INDEPENDENT_AMBULATORY_CARE_PROVIDER_SITE_OTHER): Payer: Medicare Other | Admitting: Nurse Practitioner

## 2016-02-13 ENCOUNTER — Encounter: Payer: Self-pay | Admitting: Nurse Practitioner

## 2016-02-13 VITALS — Ht 63.0 in | Wt 169.8 lb

## 2016-02-13 DIAGNOSIS — I10 Essential (primary) hypertension: Secondary | ICD-10-CM

## 2016-02-13 DIAGNOSIS — G43009 Migraine without aura, not intractable, without status migrainosus: Secondary | ICD-10-CM

## 2016-02-13 DIAGNOSIS — E785 Hyperlipidemia, unspecified: Secondary | ICD-10-CM

## 2016-02-13 MED ORDER — DIVALPROEX SODIUM ER 500 MG PO TB24: 500.0000 mg | ORAL_TABLET | Freq: Every day | ORAL | 6 refills | Status: DC

## 2016-02-13 NOTE — Progress Notes (Signed)
I have read the note, and I agree with the clinical assessment and plan.  Dariush Mcnellis A. Kaylon Hitz, MD, PhD Certified in Neurology, Clinical Neurophysiology, Sleep Medicine, Pain Medicine and Neuroimaging  [facility] [address], [address] , [place] [address] [phone]  

## 2016-02-13 NOTE — Progress Notes (Signed)
[facility]  PATIENT: Jeanette [name] DOB: 1951-01-10   REASON FOR VISIT: Follow-up for headache HISTORY FROM: Patient    HISTORY OF PRESENT ILLNESS:UPDATE 09/22/2017CM Jeanette [name], 65 year old female returns for follow-up. She was placed on Topamax with some improvement in her headaches however as the dose was increased she had side effects. She was placed on Depakote a couple of months ago and is currently taking 500 extended release with good control of her headaches. It does cause some dizziness when she takes the morning and she was told to take at bedtime. She continues to exercise regularly.Nerve conduction studies  08/14/15 done on both upper extremities show evidence of mild bilateral carpal tunnel syndrome. EMG evaluation of the left upper extremity shows findings consistent with a chronic stable carpal tunnel syndrome, with no evidence of an overlying cervical radiculopathy. A limited EMG study of the right upper extremity shows findings consistent with a chronic stable carpal tunnel syndrome. She returns for reevaluation   Update 07/09/2015 :PS  She returns for follow-up after last visit 3 months ago. She initially noted some improvement in her headaches after starting Topamax 25 twice a day but now today she is having daily headaches. These can last up to a few hours of almost the whole day. These are moderate in intensity bifrontal pressure-like sensation and feeling of heaviness. There is no accompanying nausea, vomiting, light or sound sensitivity. She does take tramadol but mostly at night to help her sleep. She has Regularly and goes to the gym at least twice a week. She does admit that she has some neck muscle tension and there has been some stress going on in her household. She has some subjective memory complaints but on Mini-Mental status testing today in office she scored 30/30 without any deficits. She had follow-up MRI scan of the brain done on 03/30/15  which I personally reviewed and shows resolution of the left medial temporal lobe hippocampal diffusion-positive hyperintensity which is no longer seen.  HISTORY PSMs Jeanette [name] is a 47 year  Lady seen today for first office follow-up visit following hospital admission for severe headache 3 weeks ago  A complaint bad nausea, photophobia and phonophobia.Jeanette [name] is an 65 y.o. female hx of HTN, HLD, DM presenting with 2 weeks of intermittent headache. Has associated dizziness and gait instability. Dizziness described as a sensation that the room is moving. Headaches described as a generalized pulsating type pain that comes and goes. Notes associated nausea, emesis, photo and phonophobia. Denies any visual changes. No jaw pain or fatigue. Notes recently starting on a nasal spray for allergies. Has been taking tylenol for her headache. 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 BP noted to be elevated in ED, highest noted 180/80.  Date last known well: unclear Time last known well: unclear tPA Given: no, unclear last seen well Modified Rankin: Rankin Score=0  she had a prior history of migraines which prior to admission had been inactive and this headache was felt to be migrainous and treated accordingly and she often relief. She underwent extensive evaluation for possible source of embolism but all the testing was negative. This included transthoracic echo should she which showed normal ejection fraction. MRA of the brain showed no large vessel stenosis or occlusion. Carotid ultrasound showed no extracranial stenosis. LDL cholesterol was normal at 56  mg percent. Trans- esophageal echocardiogram showed no PFO or cardiac source of  embolism. ESR, ANA , angiotensin-converting enzyme and antibodies for Lyme disease were all normal. Hemoglobin A1c was normal. She was discharged home on tapering steroids  and Topamax. She states her headaches have improved but she still gets them twice a week or so. She takes Tylenol Extra Strength and if that is not effective uses tramadol which works but makes her sleepy. She has not yet started Topamax for prophylaxis. She is unable to run findings specific triggers for headaches. She wants to go back to work. Update 07/09/2015 :  She returns for follow-up after last visit 3 months ago. She initially noted some improvement in her headaches after starting Topamax 25 twice a day but now today she is having daily headaches. These can last up to a few hours of almost the whole day. These are moderate in intensity bifrontal pressure-like sensation and feeling of heaviness. There is no accompanying nausea, vomiting, light or sound sensitivity. She does take tramadol but mostly at night to help her sleep. She has Regularly and goes to the gym at least twice a week. She does admit that she has some neck muscle tension and there has been some stress going on in her household. She has some subjective memory complaints but on Mini-Mental status testing today in office she scored 30/30 without any deficits. She had follow-up MRI scan of the brain done on 03/30/15 which I personally reviewed and shows resolution of the left medial temporal lobe hippocampal diffusion-positive hyperintensity which is no longer seen. REVIEW OF SYSTEMS: Full 14 system review of systems performed and notable only for those listed, all others are neg:  Constitutional: neg  Cardiovascular: neg Ear/Nose/Throat: neg  Skin: neg Eyes: Blurred vision Respiratory: neg Gastroitestinal: neg  Hematology/Lymphatic: Easy bruising Endocrine: neg Musculoskeletal: Joint pain Allergy/Immunology: neg Neurological: Headache Psychiatric: neg Sleep : neg   ALLERGIES: Allergies  Allergen Reactions  . Topamax [Topiramate]     hairloss    HOME MEDICATIONS: Outpatient Medications Prior to Visit  Medication Sig  Dispense Refill  . ACCU-CHEK AVIVA PLUS test strip     . atorvastatin (LIPITOR) 20 MG tablet Take 20 mg by mouth every morning.     . calcium-vitamin D (OSCAL WITH D) 500-200 MG-UNIT per tablet Take 2 tablets by mouth 2 (two) times daily. 60 tablet 1  . clopidogrel (PLAVIX) 75 MG tablet Take 1 tablet (75 mg total) by mouth daily. 90 tablet 3  . divalproex (DEPAKOTE ER) 500 MG 24 hr tablet Take 1 tablet (500 mg total) by mouth daily. 30 tablet 3  . gabapentin (NEURONTIN) 300 MG capsule Take 300 mg by mouth 3 (three) times daily.    . hydrochlorothiazide (HYDRODIURIL) 12.5 MG tablet Take 12.5 mg by mouth daily.    Marland Kitchen losartan (COZAAR) 100 MG tablet Take 100 mg by mouth daily.     . metFORMIN (GLUCOPHAGE) 1000 MG tablet Take 1,000 mg by mouth 2 (two) times daily with a meal.     . Multiple Vitamins-Minerals (MULTIVITAMIN WITH MINERALS) tablet Take 1 tablet by mouth daily.     . pioglitazone (ACTOS) 30 MG tablet     . SYNTHROID 88 MCG tablet Take 1 tablet (88 mcg total) by mouth daily before breakfast. 30 tablet 3  . traMADol (ULTRAM) 50 MG tablet Take 1 tablet (50 mg total) by mouth daily as needed for moderate pain. 30 tablet 0  . pioglitazone (ACTOS) 45 MG tablet Take 45 mg by mouth daily.    Marland Kitchen topiramate (TOPAMAX) 50 MG  tablet Take 2 tablets (100 mg total) by mouth 2 (two) times daily. (Patient not taking: Reported on 02/13/2016) 360 tablet 2   No facility-administered medications prior to visit.     PAST MEDICAL HISTORY: Past Medical History:  Diagnosis Date  . Anemia   . Arthritis    "back" (07/18/2014)  . Cancer of left breast ([facility])   . Chronic back pain   . Headache   . Heart murmur    many years ago  . Hyperlipidemia   . Hypertension   . Hypothyroidism    thyroidectomy 07/18/2014  . Neuromuscular disorder ([facility])    neuropathy hands and feet  . Pneumonia ~ 2010  . Stroke ([facility])   . Type II diabetes mellitus ([place])     PAST SURGICAL HISTORY: Past Surgical History:  Procedure  Laterality Date  . ABDOMINAL HYSTERECTOMY  1970's  . APPENDECTOMY    . BILATERAL OOPHORECTOMY  1980j's  . BREAST BIOPSY Left 2008  . BUNIONECTOMY Right   . CARDIAC CATHETERIZATION  02/16/2011   NL coronares, EF 65%, no AS or MR (Dr. Corliss Parish; [place])  . CARPAL TUNNEL RELEASE Bilateral   . MASTECTOMY Left 2008  . TEE WITHOUT CARDIOVERSION N/A 02/25/2015   Procedure: TRANSESOPHAGEAL ECHOCARDIOGRAM (TEE);  Surgeon: Sueanne Margarita, MD;  [facility];  Service: Cardiovascular;  Laterality: N/A;  . THYROIDECTOMY N/A 07/18/2014   Procedure: TOTAL THYROIDECTOMY;  Surgeon: Armandina Gemma, MD;  Location: [facility];  Service: General;  Laterality: N/A;  . TONSILLECTOMY    . TOTAL THYROIDECTOMY  07/18/2014    FAMILY HISTORY: Family History  Problem Relation Age of Onset  . Hypertension Father   . Diabetes Father   . Hypertension Sister   . Diabetes Sister     SOCIAL HISTORY: Social History   Social History  . Marital status: Divorced    Spouse name: N/A  . Number of children: N/A  . Years of education: N/A   Occupational History  . Not on file.   Social History Main Topics  . Smoking status: Former Smoker    Packs/day: 0.25    Years: 5.00    Types: Cigarettes    Quit date: 05/24/1970  . Smokeless tobacco: Never Used  . Alcohol use No     Comment: 07/18/2014 "quit drinking in the 1980's"  . Drug use:     Types: Marijuana     Comment: occassionally way back when  . Sexual activity: Not on file   Other Topics Concern  . Not on file   Social History Narrative  . No narrative on file     PHYSICAL EXAM  Vitals:   02/13/16 1054  Weight: 169 lb 12.8 oz (77 kg)  Height: '5\' 3"'$  (1.6 m)   Body mass index is 30.08 kg/m. General: well developed, well nourished  , seated, in no evident distress Head: head normocephalic and atraumatic.  Neck: supple with no carotid bruits Cardiovascular: regular rate and rhythm, no murmurs Musculoskeletal: no deformity Skin:   no rash/petichiae   Neurological examination  Mental Status: Awake and fully alert. Oriented to place and time. Recent and remote memory intact. Attention span, concentration and fund of knowledge appropriate. Mood and affect appropriate.  Cranial Nerves: Fundoscopic exam reveals sharp disc margins. Pupils equal, briskly reactive to light. Extraocular movements full without nystagmus. Visual fields full to confrontation. Hearing intact. Facial sensation intact. Face, tongue, palate moves normally and symmetrically.  Motor: Normal bulk and tone. Normal strength in  all tested extremity muscles. Sensory.: intact to touch ,pinprick .position and vibratory sensation.in the  upper and lower extremities  Coordination: Rapid alternating movements normal in all extremities. Finger-to-nose and heel-to-shin performed accurately bilaterally. Gait and Station: Arises from chair without difficulty. Stance is normal. Gait demonstrates normal stride length and balance . Able to heel, toe and mildly unsteady with tandem walk .   Reflexes: 1+ and symmetric. Toes downgoing.  DIAGNOSTIC DATA (LABS, IMAGING, TESTING) - I reviewed patient records, labs, notes, testing and imaging myself where available.  Lab Results  Component Value Date   WBC 4.9 02/22/2015   HGB 11.6 (L) 02/22/2015   HCT 36.7 02/22/2015   MCV 83.4 02/22/2015   PLT PLATELET CLUMPS NOTED ON SMEAR, UNABLE TO ESTIMATE 02/22/2015      Component Value Date/Time   NA 140 02/22/2015 2003   NA 143 03/22/2014 1140   K 5.0 02/22/2015 2003   K 4.0 03/22/2014 1140   CL 107 02/22/2015 2003   CO2 25 02/22/2015 2003   CO2 29 03/22/2014 1140   GLUCOSE 94 02/22/2015 2003   GLUCOSE 89 03/22/2014 1140   BUN 14 02/22/2015 2003   BUN 19.6 03/22/2014 1140   CREATININE 1.22 (H) 02/22/2015 2003   CREATININE 1.1 03/22/2014 1140   CALCIUM 9.4 02/22/2015 2003   CALCIUM 10.3 03/22/2014 1140   PROT 7.0 03/22/2014 1140   ALBUMIN 4.1 03/22/2014 1140   AST 18  03/22/2014 1140   ALT 20 03/22/2014 1140   ALKPHOS 58 03/22/2014 1140   BILITOT 1.10 03/22/2014 1140   GFRNONAA 46 (L) 02/22/2015 2003   GFRAA 54 (L) 02/22/2015 2003   Lab Results  Component Value Date   CHOL 143 02/23/2015   HDL 80 02/23/2015   LDLCALC 56 02/23/2015   TRIG 34 02/23/2015   CHOLHDL 1.8 02/23/2015   Lab Results  Component Value Date   HGBA1C 6.3 (H) 02/23/2015      ASSESSMENT AND PLAN 64 year  Lady with sudden onset of severe headache  likely migraines with an abnormal MRI scan showing  Transient tiny left medial temporal lobe diffusion hyperintensity of unclear significance which is now resolved on follow-up MRI scan. Workup for  Source of Embolic stroke has been unyielding. Persistent daily headaches have improved on Depakote. The patient is a current patient of Dr. Leonie Man  who is out of the office today . This note is sent to the work in doctor.     PLAN: Continue Depakote at current dose take at bedtime will renew Given a list of migraine triggers Continue exercising Follow-up in 6 months Dennie Bible, [facility], [facility], [facility] [address], [place], [place] [address] [phone]

## 2016-02-13 NOTE — Patient Instructions (Signed)
Continue Depakote at current dose take at bedtime will renew Given a list of migraine triggers Continue exercising Follow-up in 6 months

## 2016-05-16 ENCOUNTER — Emergency Department (HOSPITAL_BASED_OUTPATIENT_CLINIC_OR_DEPARTMENT_OTHER)
Admission: EM | Admit: 2016-05-16 | Discharge: 2016-05-17 | Disposition: A | Discharge status: Home / Self Care | Payer: Medicare Other | Attending: Emergency Medicine | Admitting: Emergency Medicine

## 2016-05-16 ENCOUNTER — Encounter (HOSPITAL_BASED_OUTPATIENT_CLINIC_OR_DEPARTMENT_OTHER): Payer: Self-pay | Admitting: Respiratory Therapy

## 2016-05-16 DIAGNOSIS — Z87891 Personal history of nicotine dependence: Secondary | ICD-10-CM | POA: Diagnosis not present

## 2016-05-16 DIAGNOSIS — E039 Hypothyroidism, unspecified: Secondary | ICD-10-CM | POA: Diagnosis not present

## 2016-05-16 DIAGNOSIS — Z7984 Long term (current) use of oral hypoglycemic drugs: Secondary | ICD-10-CM | POA: Diagnosis not present

## 2016-05-16 DIAGNOSIS — E1122 Type 2 diabetes mellitus with diabetic chronic kidney disease: Secondary | ICD-10-CM | POA: Diagnosis not present

## 2016-05-16 DIAGNOSIS — Z853 Personal history of malignant neoplasm of breast: Secondary | ICD-10-CM | POA: Insufficient documentation

## 2016-05-16 DIAGNOSIS — I509 Heart failure, unspecified: Secondary | ICD-10-CM | POA: Insufficient documentation

## 2016-05-16 DIAGNOSIS — I130 Hypertensive heart and chronic kidney disease with heart failure and stage 1 through stage 4 chronic kidney disease, or unspecified chronic kidney disease: Secondary | ICD-10-CM | POA: Diagnosis not present

## 2016-05-16 DIAGNOSIS — R51 Headache: Secondary | ICD-10-CM | POA: Diagnosis present

## 2016-05-16 DIAGNOSIS — Z79899 Other long term (current) drug therapy: Secondary | ICD-10-CM | POA: Diagnosis not present

## 2016-05-16 DIAGNOSIS — N183 Chronic kidney disease, stage 3 (moderate): Secondary | ICD-10-CM | POA: Diagnosis not present

## 2016-05-16 DIAGNOSIS — G8929 Other chronic pain: Secondary | ICD-10-CM

## 2016-05-16 LAB — CBG MONITORING, ED: GLUCOSE-CAPILLARY: 96 mg/dL (ref 65–99)

## 2016-05-16 MED ORDER — PROCHLORPERAZINE EDISYLATE 5 MG/ML IJ SOLN
5.0000 mg | Freq: Once | INTRAMUSCULAR | Status: AC
  Administered 2016-05-17: 5 mg via INTRAVENOUS
  Filled 2016-05-16: qty 2

## 2016-05-16 MED ORDER — DIPHENHYDRAMINE HCL 50 MG/ML IJ SOLN
25.0000 mg | Freq: Once | INTRAMUSCULAR | Status: AC
  Administered 2016-05-17: 25 mg via INTRAVENOUS
  Filled 2016-05-16: qty 1

## 2016-05-16 MED ORDER — SODIUM CHLORIDE 0.9 % IV BOLUS (SEPSIS)
1000.0000 mL | Freq: Once | INTRAVENOUS | Status: AC
  Administered 2016-05-17: 1000 mL via INTRAVENOUS

## 2016-05-16 NOTE — ED Provider Notes (Signed)
[facility] DEPT [facility] Provider Note   CSN: [id number] Arrival date & time: 05/16/16  2141 By signing my name below, I, [name], attest that this documentation has been prepared under the direction and in the presence of Merryl Hacker, MD . Electronically Signed: [name], Scribe. 05/16/2016. 11:32 PM.   History   Chief Complaint Chief Complaint  Patient presents with  . Headache   HPI Jeanette [name] is a 65 y.o. female with a hx of stroke, DM 2, migraine, who presents to the Emergency Department complaining of persistent, waxing and waning headache onset one month ago. Per pt, the pain worsened significantly two days ago and now radiates into her neck. She rates her pain 10/10 in severity. Pt was seen by her PCP for the same where she was told that she had fluid in her ears and was given nasal spray with no relief. Per pt, she has had headaches since having a stroke. She is concerned because she had a headache before her last stroke. Pt denies fever, CP, SOB or any other associated symptoms.   The history is provided by the patient. No language interpreter was used.   Past Medical History:  Diagnosis Date  . Anemia   . Arthritis    "back" (07/18/2014)  . Cancer of left breast ([place])   . Chronic back pain   . Headache   . Heart murmur    many years ago  . Hyperlipidemia   . Hypertension   . Hypothyroidism    thyroidectomy 07/18/2014  . Neuromuscular disorder (H[facility])    neuropathy hands and feet  . Pneumonia ~ 2010  . Stroke ([place])   . Type II diabetes mellitus [facility])     Patient Active Problem List   Diagnosis Date Noted  . Migraine 03/19/2015  . Abnormal MRI of the head 03/19/2015  . CKD stage 3 due to type 2 diabetes mellitus ([place]) 02/24/2015  . Chronic diastolic CHF (congestive heart failure) ([place]) 02/24/2015  . Acute CVA (cerebrovascular accident) ([place])   . 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 Neoplasm of uncertain behavior of thyroid gland  07/18/2014  . Multiple thyroid nodules 07/18/2014  . HLD (hyperlipidemia) 03/26/2014  . Breast cancer of upper-outer quadrant of left female breast ([place]) 03/22/2014  . 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 Diabetes ([place]) 02/05/2014  . Thyroid nodule 02/05/2014  . H/O malignant neoplasm of breast 02/05/2014  . Essential (primary) hypertension 02/05/2014    Past Surgical History:  Procedure Laterality Date  . ABDOMINAL HYSTERECTOMY  1970's  . APPENDECTOMY    . BILATERAL OOPHORECTOMY  1980j's  . BREAST BIOPSY Left 2008  . BUNIONECTOMY Right   . CARDIAC CATHETERIZATION  02/16/2011   NL coronares, EF 65%, no AS or MR (Dr. Corliss Parish; [place])  . CARPAL TUNNEL RELEASE Bilateral   . MASTECTOMY Left 2008  . TEE WITHOUT CARDIOVERSION N/A 02/25/2015   Procedure: TRANSESOPHAGEAL ECHOCARDIOGRAM (TEE);  Surgeon: Sueanne Margarita, MD;  Location: [facility] ENDOSCOPY;  Service: Cardiovascular;  Laterality: N/A;  . THYROIDECTOMY N/A 07/18/2014   Procedure: TOTAL THYROIDECTOMY;  Surgeon: Armandina Gemma, MD;  Location: [place];  Service: General;  Laterality: N/A;  . TONSILLECTOMY    . TOTAL THYROIDECTOMY  07/18/2014    OB History    No data available      Home Medications    Prior to Admission medications   Medication Sig Start Date End Date Taking? Authorizing Provider  ACCU-CHEK AVIVA PLUS test strip  02/27/15   Historical Provider, MD  atorvastatin (LIPITOR) 20 MG tablet Take 20 mg by mouth every morning.  02/16/11   Historical Provider, MD  calcium-vitamin D (OSCAL WITH D) 500-200 MG-UNIT per tablet Take 2 tablets by mouth 2 (two) times daily. 07/19/14   Armandina Gemma, MD  clopidogrel (PLAVIX) 75 MG tablet Take 1 tablet (75 mg total) by mouth daily. 04/02/15   Garvin Fila, MD  divalproex (DEPAKOTE ER) 500 MG 24 hr tablet Take 1 tablet (500 mg total) by mouth daily. 02/13/16   Dennie Bible, NP  gabapentin (NEURONTIN) 300 MG capsule Take 300 mg by mouth 3 (three) times daily.     Historical Provider, MD  hydrochlorothiazide (HYDRODIURIL) 12.5 MG tablet Take 12.5 mg by mouth daily. 02/07/15   Historical Provider, MD  losartan (COZAAR) 100 MG tablet Take 100 mg by mouth daily.  02/16/11   Historical Provider, MD  metFORMIN (GLUCOPHAGE) 1000 MG tablet Take 1,000 mg by mouth 2 (two) times daily with a meal.  02/16/11   Historical Provider, MD  Multiple Vitamins-Minerals (MULTIVITAMIN WITH MINERALS) tablet Take 1 tablet by mouth daily.  02/16/11   Historical Provider, MD  pioglitazone (ACTOS) 30 MG tablet  05/14/15   Historical Provider, MD  SYNTHROID 88 MCG tablet Take 1 tablet (88 mcg total) by mouth daily before breakfast. 07/19/14   Armandina Gemma, MD  traMADol (ULTRAM) 50 MG tablet Take 1 tablet (50 mg total) by mouth daily as needed for moderate pain. 02/25/15   Shanker Kristeen Mans, MD    Family History Family History  Problem Relation Age of Onset  . Hypertension Father   . Diabetes Father   . Hypertension Sister   . Diabetes Sister     Social History Social History  Substance Use Topics  . Smoking status: Former Smoker    Packs/day: 0.25    Years: 5.00    Types: Cigarettes    Quit date: 05/24/1970  . Smokeless tobacco: Never Used  . Alcohol use No     Comment: 07/18/2014 "quit drinking in the 1980's"    Allergies   Topamax [topiramate]  Review of Systems Review of Systems  Constitutional: Negative for fever.  Respiratory: Negative for shortness of breath.   Cardiovascular: Negative for chest pain.  Musculoskeletal: Positive for neck pain.  Neurological: Positive for headaches.  All other systems reviewed and are negative.  Physical Exam Updated Vital Signs BP 176/75   Pulse 73   Temp 98.2 F (36.8 C) (Oral)   Resp 18   Ht 5\' 3"  (1.6 m)   Wt 180 lb (81.6 kg)   SpO2 100%   BMI 31.89 kg/m   Physical Exam  Constitutional: She is oriented to person, place, and time. She appears well-developed and well-nourished. No distress.  HENT:  Head:  Normocephalic and atraumatic.  Eyes: EOM are normal. Pupils are equal, round, and reactive to light.  Neck: Normal range of motion. Neck supple.  Tenderness palpation left posterior neck  Cardiovascular: Normal rate, regular rhythm and normal heart sounds.   Pulmonary/Chest: Effort normal and breath sounds normal. No respiratory distress. She has no wheezes.  Abdominal: Soft. There is no tenderness.  Neurological: She is alert and oriented to person, place, and time.  Cranial nerves II through XII intact, 5 out of 5 strength in all 4 extremities, no dysmetria to finger-nose-finger  Skin: Skin is warm and dry.  Psychiatric: She has a normal mood and affect.  Nursing note and vitals reviewed.  ED Treatments / Results  DIAGNOSTIC STUDIES:  Oxygen Saturation is 100% on RA, normal by my interpretation.    COORDINATION OF CARE:  11:15 PM Discussed treatment plan with pt at bedside and pt agreed to plan.  Labs (all labs ordered are listed, but only abnormal results are displayed) Labs Reviewed  CBG MONITORING, ED  CBG MONITORING, ED    EKG  EKG Interpretation  Date/Time:  Sunday May 16 2016 21:56:49 EST Ventricular Rate:  67 PR Interval:    QRS Duration: 72 QT Interval:  390 QTC Calculation: 412 R Axis:   33 Text Interpretation:  Sinus rhythm with 1st degree A-V block Otherwise normal ECG Otherwise no significant change Confirmed by [facility] MD, PEDRO ([id number]) on 05/16/2016 10:19:39 PM       Radiology No results found.  Procedures Procedures (including critical care time)  Medications Ordered in ED Medications  sodium chloride 0.9 % bolus 1,000 mL (1,000 mLs Intravenous New Bag/Given 05/17/16 0021)  prochlorperazine (COMPAZINE) injection 5 mg (5 mg Intravenous Given 05/17/16 0022)  diphenhydrAMINE (BENADRYL) injection 25 mg (25 mg Intravenous Given 05/17/16 0031)     Initial Impression / Assessment and Plan / ED Course  I have reviewed the triage vital signs  and the nursing notes.  Pertinent labs & imaging results that were available during my care of the patient were reviewed by me and considered in my medical decision making (see chart for details).  Clinical Course     Patient presents with headache. Ongoing for the last month. Reports chronic headache following a stroke. Currently neurologically intact. No other red flags. She is on Depakote. She has a neurologist. Patient given migraine cocktail. On recheck, she reports improvement of her headache and she remains neurologically intact. Given the chronicity of symptoms, doubt subarachnoid hemorrhage or acute infectious process. Will have patient follow-up with her neurologist.  After history, exam, and medical workup I feel the patient has been appropriately medically screened and is safe for discharge home. Pertinent diagnoses were discussed with the patient. Patient was given return precautions.  Final Clinical Impressions(s) / ED Diagnoses   Final diagnoses:  Chronic nonintractable headache, unspecified headache type    New Prescriptions New Prescriptions   No medications on file   I personally performed the services described in this documentation, which was scribed in my presence. The recorded information has been reviewed and is accurate.    Merryl Hacker, MD 05/17/16 [phone]

## 2016-05-16 NOTE — ED Triage Notes (Signed)
Headache x 4 weeks. Pain goes down her left neck. She was given nose spray and told she had fluid in her ear. Pain is not better.

## 2016-05-17 NOTE — Discharge Instructions (Signed)
You were seen today for headache. He has a history of the same. You improved with a migraine cocktail. Follow-up with your neurologist giving the ongoing nature of her headaches. If you develop weakness, numbness, tingling, speech difficulty or any new or worsening symptoms she needs to be reevaluated immediately.

## 2016-06-07 ENCOUNTER — Ambulatory Visit: Payer: Medicare Other | Admitting: Neurology

## 2016-07-07 ENCOUNTER — Encounter: Payer: Self-pay | Admitting: Neurology

## 2016-07-07 ENCOUNTER — Ambulatory Visit (INDEPENDENT_AMBULATORY_CARE_PROVIDER_SITE_OTHER): Payer: Medicare Other | Admitting: Neurology

## 2016-07-07 VITALS — BP 148/79 | HR 75 | Wt 178.0 lb

## 2016-07-07 DIAGNOSIS — G44209 Tension-type headache, unspecified, not intractable: Secondary | ICD-10-CM | POA: Diagnosis not present

## 2016-07-07 DIAGNOSIS — I6529 Occlusion and stenosis of unspecified carotid artery: Secondary | ICD-10-CM | POA: Diagnosis not present

## 2016-07-07 MED ORDER — DIVALPROEX SODIUM ER 500 MG PO TB24: 500.0000 mg | ORAL_TABLET | Freq: Two times a day (BID) | ORAL | 6 refills | Status: DC

## 2016-07-07 NOTE — Patient Instructions (Addendum)
I had  a long discussion the patient with regards to her tension headaches which appear to be suboptimally controlled. I recommend she increase the Depakote dose to 500 mg twice daily. I encouraged her to do regular neck stretching exercises as well as participate in activities for stress relaxation-like regular exercise, swimming, medication and yoga. Continue Plavix for stroke prevention with strict control of hypertension with blood pressure goal below 130/90, lipids with LDL cholesterol goal below 70 mg percent and diabetes with hemoglobin A1c goal below 6.5%. Check follow-up carotid ultrasound study. Patient's mild carpal tunnel appears to be stable and hence no further workup or treatment is necessary for that at the current time. She will return for follow-up in 3 months with my nurse practitioner or call earlier if necessary.

## 2016-07-07 NOTE — Progress Notes (Signed)
[facility]  PATIENT: Jeanette [name] DOB: 07-Nov-1950   REASON FOR VISIT: Follow-up for headache HISTORY FROM: Patient    HISTORY OF PRESENT ILLNESS:UPDATE 09/22/2017CM Jeanette [name], 66 year old female returns for follow-up. She was placed on Topamax with some improvement in her headaches however as the dose was increased she had side effects. She was placed on Depakote a couple of months ago and is currently taking 500 extended release with good control of her headaches. It does cause some dizziness when she takes the morning and she was told to take at bedtime. She continues to exercise regularly.Nerve conduction studies  08/14/15 done on both upper extremities show evidence of mild bilateral carpal tunnel syndrome. EMG evaluation of the left upper extremity shows findings consistent with a chronic stable carpal tunnel syndrome, with no evidence of an overlying cervical radiculopathy. A limited EMG study of the right upper extremity shows findings consistent with a chronic stable carpal tunnel syndrome. She returns for reevaluation   Update 07/09/2015 :PS  She returns for follow-up after last visit 3 months ago. She initially noted some improvement in her headaches after starting Topamax 25 twice a day but now today she is having daily headaches. These can last up to a few hours of almost the whole day. These are moderate in intensity bifrontal pressure-like sensation and feeling of heaviness. There is no accompanying nausea, vomiting, light or sound sensitivity. She does take tramadol but mostly at night to help her sleep. She has Regularly and goes to the gym at least twice a week. She does admit that she has some neck muscle tension and there has been some stress going on in her household. She has some subjective memory complaints but on Mini-Mental status testing today in office she scored 30/30 without any deficits. She had follow-up MRI scan of the brain done on 03/30/15  which I personally reviewed and shows resolution of the left medial temporal lobe hippocampal diffusion-positive hyperintensity which is no longer seen.  HISTORY PSMs Nobel is a 80 year  Lady seen today for first office follow-up visit following hospital admission for severe headache 3 weeks ago  A complaint bad nausea, photophobia and phonophobia.Jeanette [name] is an 66 y.o. female hx of HTN, HLD, DM presenting with 2 weeks of intermittent headache. Has associated dizziness and gait instability. Dizziness described as a sensation that the room is moving. Headaches described as a generalized pulsating type pain that comes and goes. Notes associated nausea, emesis, photo and phonophobia. Denies any visual changes. No jaw pain or fatigue. Notes recently starting on a nasal spray for allergies. Has been taking tylenol for her headache. 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 BP noted to be elevated in ED, highest noted 180/80.  Date last known well: unclear Time last known well: unclear tPA Given: no, unclear last seen well Modified Rankin: Rankin Score=0  she had a prior history of migraines which prior to admission had been inactive and this headache was felt to be migrainous and treated accordingly and she often relief. She underwent extensive evaluation for possible source of embolism but all the testing was negative. This included transthoracic echo should she which showed normal ejection fraction. MRA of the brain showed no large vessel stenosis or occlusion. Carotid ultrasound showed no extracranial stenosis. LDL cholesterol was normal at 56  mg percent. Trans- esophageal echocardiogram showed no PFO or cardiac source of  embolism. ESR, ANA , angiotensin-converting enzyme and antibodies for Lyme disease were all normal. Hemoglobin A1c was normal. She was discharged home on tapering steroids  and Topamax. She states her headaches have improved but she still gets them twice a week or so. She takes Tylenol Extra Strength and if that is not effective uses tramadol which works but makes her sleepy. She has not yet started Topamax for prophylaxis. She is unable to run findings specific triggers for headaches. She wants to go back to work. Update 07/09/2015 :  She returns for follow-up after last visit 3 months ago. She initially noted some improvement in her headaches after starting Topamax 25 twice a day but now today she is having daily headaches. These can last up to a few hours of almost the whole day. These are moderate in intensity bifrontal pressure-like sensation and feeling of heaviness. There is no accompanying nausea, vomiting, light or sound sensitivity. She does take tramadol but mostly at night to help her sleep. She has Regularly and goes to the gym at least twice a week. She does admit that she has some neck muscle tension and there has been some stress going on in her household. She has some subjective memory complaints but on Mini-Mental status testing today in office she scored 30/30 without any deficits. She had follow-up MRI scan of the brain done on 03/30/15 which I personally reviewed and shows resolution of the left medial temporal lobe hippocampal diffusion-positive hyperintensity which is no longer seen. Update 07/07/2016 : She returns for follow-up after last visit 6 months ago with Jeanette [name], nurse practitioner. She states the headaches continue almost every other day. She did not tolerate Topamax due to side effects and was switched to Depakote ER 500 mg daily. She is tolerating it well without side effects and feels Depakote have helped but could work better. She describes the headache as starting in the left frontal region and on her eyes and going to the ears and occasionally the back. This is aching in quality constant moderate in intensity mostly occasionally severe.  There is very occasional nausea but no light or sound sensitivity. The patient does regular activities like water aerobics and goes to the gym 3 days a week. She has however not been doing regular neck stretching exercises. Patient had lasted complain of some paresthesias in her hand and had an EMG nerve conduction study done on 08/14/15 by Dr. Jannifer Franklin which is suggested mild bilateral carpal tunnel. Patient states her paresthesias are stable and not bothersome at the present time. She has not had any recurrent stroke or TIA symptoms. She remains on Plavix which is tolerating well without bruising or bleeding. Blood pressure is well controlled though it is slightly elevated in office today and 148/79. Fasting sugars have all been good ranging from 110-120. She is not aware when last A1c was checked. His is tolerating Lipitor well without muscle aches or pains. REVIEW OF SYSTEMS: Full 14 system review of systems performed and notable only for those listed, all others are neg:  Easy bruising, headache, back pain, muscle cramps, ear pain and all other systems negative  ALLERGIES: Allergies  Allergen Reactions  . Topamax [Topiramate]     hairloss    HOME MEDICATIONS: Outpatient Medications Prior to Visit  Medication Sig Dispense Refill  . ACCU-CHEK AVIVA PLUS test strip     . atorvastatin (LIPITOR) 20 MG tablet Take 20 mg by mouth every morning.     . calcium-vitamin D (OSCAL  WITH D) 500-200 MG-UNIT per tablet Take 2 tablets by mouth 2 (two) times daily. 60 tablet 1  . clopidogrel (PLAVIX) 75 MG tablet Take 1 tablet (75 mg total) by mouth daily. 90 tablet 3  . gabapentin (NEURONTIN) 300 MG capsule Take 300 mg by mouth 3 (three) times daily.    . hydrochlorothiazide (HYDRODIURIL) 12.5 MG tablet Take 12.5 mg by mouth daily.    Marland Kitchen losartan (COZAAR) 100 MG tablet Take 100 mg by mouth daily.     . metFORMIN (GLUCOPHAGE) 1000 MG tablet Take 1,000 mg by mouth 2 (two) times daily with a meal.     . Multiple  Vitamins-Minerals (MULTIVITAMIN WITH MINERALS) tablet Take 1 tablet by mouth daily.     . pioglitazone (ACTOS) 30 MG tablet     . SYNTHROID 88 MCG tablet Take 1 tablet (88 mcg total) by mouth daily before breakfast. 30 tablet 3  . traMADol (ULTRAM) 50 MG tablet Take 1 tablet (50 mg total) by mouth daily as needed for moderate pain. 30 tablet 0  . divalproex (DEPAKOTE ER) 500 MG 24 hr tablet Take 1 tablet (500 mg total) by mouth daily. 30 tablet 6   No facility-administered medications prior to visit.     PAST MEDICAL HISTORY: Past Medical History:  Diagnosis Date  . Anemia   . Arthritis    "back" (07/18/2014)  . Cancer of left breast ([facility])   . Chronic back pain   . Headache   . Heart murmur    many years ago  . Hyperlipidemia   . Hypertension   . Hypothyroidism    thyroidectomy 07/18/2014  . Neuromuscular disorder ([facility])    neuropathy hands and feet  . Pneumonia ~ 2010  . Stroke ([place])   . Type II diabetes mellitus ()     PAST SURGICAL HISTORY: Past Surgical History:  Procedure Laterality Date  . ABDOMINAL HYSTERECTOMY  1970's  . APPENDECTOMY    . BILATERAL OOPHORECTOMY  1980j's  . BREAST BIOPSY Left 2008  . BUNIONECTOMY Right   . CARDIAC CATHETERIZATION  02/16/2011   NL coronares, EF 65%, no AS or MR (Dr. Corliss Parish; [place])  . CARPAL TUNNEL RELEASE Bilateral   . MASTECTOMY Left 2008  . TEE WITHOUT CARDIOVERSION N/A 02/25/2015   Procedure: TRANSESOPHAGEAL ECHOCARDIOGRAM (TEE);  Surgeon: Sueanne Margarita, MD;  Location: [facility] ENDOSCOPY;  Service: Cardiovascular;  Laterality: N/A;  . THYROIDECTOMY N/A 07/18/2014   Procedure: TOTAL THYROIDECTOMY;  Surgeon: Armandina Gemma, MD;  Location: [facility];  Service: General;  Laterality: N/A;  . TONSILLECTOMY    . TOTAL THYROIDECTOMY  07/18/2014    FAMILY HISTORY: Family History  Problem Relation Age of Onset  . Hypertension Father   . Diabetes Father   . Hypertension Sister   . Diabetes Sister     SOCIAL  HISTORY: Social History   Social History  . Marital status: Divorced    Spouse name: N/A  . Number of children: N/A  . Years of education: N/A   Occupational History  . Not on file.   Social History Main Topics  . Smoking status: Former Smoker    Packs/day: 0.25    Years: 5.00    Types: Cigarettes    Quit date: 05/24/1970  . Smokeless tobacco: Never Used  . Alcohol use No     Comment: 07/18/2014 "quit drinking in the 1980's"  . Drug use: Yes    Types: Marijuana     Comment: occassionally way back when  .  Sexual activity: Not on file   Other Topics Concern  . Not on file   Social History Narrative  . No narrative on file     PHYSICAL EXAM  Vitals:   07/07/16 0806  BP: (!) 148/79  Pulse: 75  Weight: 178 lb (80.7 kg)   Body mass index is 31.53 kg/m. General: Pleasant middle-age African-American lady  , seated, in no evident distress Head: head normocephalic and atraumatic.  Neck: supple with no carotid bruits Cardiovascular: regular rate and rhythm, no murmurs Musculoskeletal: no deformity Skin:  no rash/petichiae   Neurological examination  Mental Status: Awake and fully alert. Oriented to place and time. Recent and remote memory intact. Attention span, concentration and fund of knowledge appropriate. Mood and affect appropriate.  Cranial Nerves: Fundoscopic exam Not done Pupils equal, briskly reactive to light. Extraocular movements full without nystagmus. Visual fields full to confrontation. Hearing intact. Facial sensation intact. Face, tongue, palate moves normally and symmetrically.  Motor: Normal bulk and tone. Normal strength in all tested extremity muscles. Sensory.: intact to touch ,pinprick .position and vibratory sensation.in the  upper and lower extremities  Coordination: Rapid alternating movements normal in all extremities. Finger-to-nose and heel-to-shin performed accurately bilaterally. Gait and Station: Arises from chair without difficulty. Stance  is normal. Gait demonstrates normal stride length and balance . Able to heel, toe and mildly unsteady with tandem walk .   Reflexes: 1+ and symmetric. Toes downgoing.  DIAGNOSTIC DATA (LABS, IMAGING, TESTING) - I reviewed patient records, labs, notes, testing and imaging myself where available.  Lab Results  Component Value Date   WBC 4.9 02/22/2015   HGB 11.6 (L) 02/22/2015   HCT 36.7 02/22/2015   MCV 83.4 02/22/2015   PLT PLATELET CLUMPS NOTED ON SMEAR, UNABLE TO ESTIMATE 02/22/2015      Component Value Date/Time   NA 140 02/22/2015 2003   NA 143 03/22/2014 1140   K 5.0 02/22/2015 2003   K 4.0 03/22/2014 1140   CL 107 02/22/2015 2003   CO2 25 02/22/2015 2003   CO2 29 03/22/2014 1140   GLUCOSE 94 02/22/2015 2003   GLUCOSE 89 03/22/2014 1140   BUN 14 02/22/2015 2003   BUN 19.6 03/22/2014 1140   CREATININE 1.22 (H) 02/22/2015 2003   CREATININE 1.1 03/22/2014 1140   CALCIUM 9.4 02/22/2015 2003   CALCIUM 10.3 03/22/2014 1140   PROT 7.0 03/22/2014 1140   ALBUMIN 4.1 03/22/2014 1140   AST 18 03/22/2014 1140   ALT 20 03/22/2014 1140   ALKPHOS 58 03/22/2014 1140   BILITOT 1.10 03/22/2014 1140   GFRNONAA 46 (L) 02/22/2015 2003   GFRAA 54 (L) 02/22/2015 2003   Lab Results  Component Value Date   CHOL 143 02/23/2015   HDL 80 02/23/2015   LDLCALC 56 02/23/2015   TRIG 34 02/23/2015   CHOLHDL 1.8 02/23/2015   Lab Results  Component Value Date   HGBA1C 6.3 (H) 02/23/2015      ASSESSMENT AND PLAN 9 year  Lady with sudden onset of severe headache  likely migraines with an abnormal MRI scan showing  Transient tiny left medial temporal lobe diffusion hyperintensity of unclear significance which is now resolved on follow-up MRI scan. Workup for  Source of Embolic stroke has been unyielding. Persistent daily headaches continue on Depakote.  Mild carpal tunnel bilateral which is stable PLAN:I had  a long discussion the patient with regards to her tension headaches which appear to  be suboptimally controlled. I recommend she increase the Depakote  dose to 500 mg twice daily. I encouraged her to do regular neck stretching exercises as well as participate in activities for stress relaxation-like regular exercise, swimming, medication and yoga. Continue Plavix for stroke prevention with strict control of hypertension with blood pressure goal below 130/90, lipids with LDL cholesterol goal below 70 mg percent and diabetes with hemoglobin A1c goal below 6.5%. Check follow-up carotid ultrasound study. Patient's mild carpal tunnel appears to be stable and hence no further workup or treatment is necessary for that at the current time. Greater than 50% time during this 25 minute visit was spent on counseling and coordination of care about her tension headache, stroke prevention and answering questions She will return for follow-up in 3 months with my nurse practitioner or call earlier if necessary. Antony Contras, MD [facility] [address], [place], [place] [address] [phone]

## 2016-07-08 ENCOUNTER — Ambulatory Visit: Payer: Medicare Other | Admitting: Neurology

## 2016-07-08 ENCOUNTER — Ambulatory Visit (INDEPENDENT_AMBULATORY_CARE_PROVIDER_SITE_OTHER): Payer: Medicare Other

## 2016-07-08 DIAGNOSIS — I6529 Occlusion and stenosis of unspecified carotid artery: Secondary | ICD-10-CM

## 2016-07-15 ENCOUNTER — Telehealth: Payer: Self-pay | Admitting: Neurology

## 2016-07-15 NOTE — Telephone Encounter (Signed)
Dr.Sethi will be in the office on Monday. Rn will call pt about results on Monday.

## 2016-07-15 NOTE — Telephone Encounter (Signed)
Pt request carotid test results.

## 2016-07-20 NOTE — Telephone Encounter (Signed)
Rn call patient that her carotid doppler was normal and negative for any stenosis. PT verbalized understanding,and wanted a copy sent to her PCP.

## 2016-07-23 NOTE — Telephone Encounter (Signed)
Copy of doppler sent to patients PCP via fax.

## 2016-08-30 ENCOUNTER — Telehealth: Payer: Self-pay | Admitting: *Deleted

## 2016-08-30 NOTE — Telephone Encounter (Signed)
"  This is Collie Siad with [facility] calling about this patient of Dr.Gudena's who participated in a term study.  We call patients annually but have been unable to reach this patient.  She provided Dr. Geralyn Flash office as contact number.  Could you confirm the number for her."  Provided number in patient's EPIC records.  Last seen at [facility] in 2016.

## 2016-09-08 DIAGNOSIS — M26629 Arthralgia of temporomandibular joint, unspecified side: Secondary | ICD-10-CM | POA: Insufficient documentation

## 2016-10-04 ENCOUNTER — Telehealth: Payer: Self-pay | Admitting: *Deleted

## 2016-10-04 ENCOUNTER — Ambulatory Visit: Payer: Medicare Other | Admitting: Nurse Practitioner

## 2016-10-04 NOTE — Telephone Encounter (Signed)
No showed follow up appointment. 

## 2016-10-15 ENCOUNTER — Encounter (HOSPITAL_COMMUNITY): Payer: Self-pay

## 2016-10-15 ENCOUNTER — Emergency Department (HOSPITAL_COMMUNITY)
Admission: EM | Admit: 2016-10-15 | Discharge: 2016-10-15 | Disposition: A | Discharge status: Home / Self Care | Payer: Medicare Other | Attending: Emergency Medicine | Admitting: Emergency Medicine

## 2016-10-15 ENCOUNTER — Emergency Department (HOSPITAL_COMMUNITY): Payer: Medicare Other

## 2016-10-15 DIAGNOSIS — I5032 Chronic diastolic (congestive) heart failure: Secondary | ICD-10-CM | POA: Insufficient documentation

## 2016-10-15 DIAGNOSIS — Y9301 Activity, walking, marching and hiking: Secondary | ICD-10-CM | POA: Diagnosis not present

## 2016-10-15 DIAGNOSIS — Z87891 Personal history of nicotine dependence: Secondary | ICD-10-CM | POA: Insufficient documentation

## 2016-10-15 DIAGNOSIS — X509XXA Other and unspecified overexertion or strenuous movements or postures, initial encounter: Secondary | ICD-10-CM | POA: Insufficient documentation

## 2016-10-15 DIAGNOSIS — N183 Chronic kidney disease, stage 3 (moderate): Secondary | ICD-10-CM | POA: Diagnosis not present

## 2016-10-15 DIAGNOSIS — Z8673 Personal history of transient ischemic attack (TIA), and cerebral infarction without residual deficits: Secondary | ICD-10-CM | POA: Insufficient documentation

## 2016-10-15 DIAGNOSIS — I130 Hypertensive heart and chronic kidney disease with heart failure and stage 1 through stage 4 chronic kidney disease, or unspecified chronic kidney disease: Secondary | ICD-10-CM | POA: Insufficient documentation

## 2016-10-15 DIAGNOSIS — Z7984 Long term (current) use of oral hypoglycemic drugs: Secondary | ICD-10-CM | POA: Insufficient documentation

## 2016-10-15 DIAGNOSIS — Y999 Unspecified external cause status: Secondary | ICD-10-CM | POA: Diagnosis not present

## 2016-10-15 DIAGNOSIS — Z7982 Long term (current) use of aspirin: Secondary | ICD-10-CM | POA: Diagnosis not present

## 2016-10-15 DIAGNOSIS — E890 Postprocedural hypothyroidism: Secondary | ICD-10-CM | POA: Diagnosis not present

## 2016-10-15 DIAGNOSIS — S83411A Sprain of medial collateral ligament of right knee, initial encounter: Secondary | ICD-10-CM | POA: Diagnosis not present

## 2016-10-15 DIAGNOSIS — Y929 Unspecified place or not applicable: Secondary | ICD-10-CM | POA: Diagnosis not present

## 2016-10-15 DIAGNOSIS — E1122 Type 2 diabetes mellitus with diabetic chronic kidney disease: Secondary | ICD-10-CM | POA: Insufficient documentation

## 2016-10-15 DIAGNOSIS — S8391XA Sprain of unspecified site of right knee, initial encounter: Secondary | ICD-10-CM

## 2016-10-15 DIAGNOSIS — Z79899 Other long term (current) drug therapy: Secondary | ICD-10-CM | POA: Insufficient documentation

## 2016-10-15 DIAGNOSIS — Z853 Personal history of malignant neoplasm of breast: Secondary | ICD-10-CM | POA: Insufficient documentation

## 2016-10-15 DIAGNOSIS — S8991XA Unspecified injury of right lower leg, initial encounter: Secondary | ICD-10-CM | POA: Diagnosis present

## 2016-10-15 MED ORDER — DICLOFENAC SODIUM 1 % TD GEL: 2.0000 g | Freq: Four times a day (QID) | TRANSDERMAL | 0 refills | Status: DC

## 2016-10-15 NOTE — ED Notes (Signed)
See EDP secondary assessment.  

## 2016-10-15 NOTE — ED Provider Notes (Signed)
[facility] DEPT Provider Note   CSN: [id number] Arrival date & time: 10/15/16  1045   By signing my name below, I, Evelene Croon, attest that this documentation has been prepared under the direction and in the presence of [facility], PA-C. Electronically Signed: Evelene Croon, Scribe. 10/15/2016. 11:39 AM.   History   Chief Complaint Chief Complaint  Patient presents with  . Knee Pain    The history is provided by the patient. No language interpreter was used.    HPI Comments:  Jeanette [name] is a 66 y.o. female with a history of arthritis who presents to the Emergency Department complaining of gradual onset, moderate, right knee pain x 3 weeks. She states she was walking when she felt a pop in the knee. No obvious injury or fall. Her pain is exacerbated by movement of the knee. Pt reports radiation of pain up the right thigh. She has applied icy hot and heat to the knee with mild relief. Pt denies h/o similar pain to the right knee. No prior orthopedic surgeries. She has no other acute complaints or associated symptoms at this time.   Past Medical History:  Diagnosis Date  . Anemia   . Arthritis    "back" (07/18/2014)  . Cancer of left breast ([place])   . Chronic back pain   . Headache   . Heart murmur    many years ago  . Hyperlipidemia   . Hypertension   . Hypothyroidism    thyroidectomy 07/18/2014  . Neuromuscular disorder (HCC)    neuropathy hands and feet  . Pneumonia ~ 2010  . Stroke ([place])   . Type II diabetes mellitus [facility])     Patient Active Problem List   Diagnosis Date Noted  . Migraine 03/19/2015  . Abnormal MRI of the head 03/19/2015  . CKD stage 3 due to type 2 diabetes mellitus ([place]) 02/24/2015  . Chronic diastolic CHF (congestive heart failure) ([place]) 02/24/2015  . Acute CVA (cerebrovascular accident) ([place])   . 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 Multiple thyroid nodules  07/18/2014  . HLD (hyperlipidemia) 03/26/2014  . Breast cancer of upper-outer quadrant of left female breast ([place]) 03/22/2014  . 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 Diabetes ([place]) 02/05/2014  . Thyroid nodule 02/05/2014  . H/O malignant neoplasm of breast 02/05/2014  . Essential (primary) hypertension 02/05/2014    Past Surgical History:  Procedure Laterality Date  . ABDOMINAL HYSTERECTOMY  1970's  . APPENDECTOMY    . BILATERAL OOPHORECTOMY  1980j's  . BREAST BIOPSY Left 2008  . BUNIONECTOMY Right   . CARDIAC CATHETERIZATION  02/16/2011   NL coronares, EF 65%, no AS or MR (Dr. Corliss Parish; [place])  . CARPAL TUNNEL RELEASE Bilateral   . MASTECTOMY Left 2008  . TEE WITHOUT CARDIOVERSION N/A 02/25/2015   Procedure: TRANSESOPHAGEAL ECHOCARDIOGRAM (TEE);  Surgeon: Sueanne Margarita, MD;  Location: [facility] ENDOSCOPY;  Service: Cardiovascular;  Laterality: N/A;  . THYROIDECTOMY N/A 07/18/2014   Procedure: TOTAL THYROIDECTOMY;  Surgeon: Armandina Gemma, MD;  Location: [facility];  Service: General;  Laterality: N/A;  . TONSILLECTOMY    . TOTAL THYROIDECTOMY  07/18/2014    OB History    No data available       Home Medications    Prior to Admission medications   Medication Sig Start Date End Date Taking? Authorizing Provider  ACCU-CHEK AVIVA PLUS test strip  02/27/15   [provider]  aspirin EC 81 MG tablet Take 81 mg by mouth daily.    [provider]  atorvastatin (LIPITOR) 20 MG tablet Take 20 mg by mouth every morning.  02/16/11   [provider]  calcium-vitamin D (OSCAL WITH D) 500-200 MG-UNIT per tablet Take 2 tablets by mouth 2 (two) times daily. 07/19/14   Armandina Gemma, MD  clopidogrel (PLAVIX) 75 MG tablet Take 1 tablet (75 mg total) by mouth daily. 04/02/15   Garvin Fila, MD  diclofenac sodium (VOLTAREN) 1 % GEL Apply 2 g topically 4 (four) times daily. 10/15/16   Kenyia Wambolt, Ozella Almond, PA-C  divalproex (DEPAKOTE ER) 500 MG 24 hr tablet Take 1  tablet (500 mg total) by mouth 2 (two) times daily. 07/07/16   Garvin Fila, MD  gabapentin (NEURONTIN) 300 MG capsule Take 300 mg by mouth 3 (three) times daily.    [provider]  hydrochlorothiazide (HYDRODIURIL) 12.5 MG tablet Take 12.5 mg by mouth daily. 02/07/15   [provider]  losartan (COZAAR) 100 MG tablet Take 100 mg by mouth daily.  02/16/11   [provider]  metFORMIN (GLUCOPHAGE) 1000 MG tablet Take 1,000 mg by mouth 2 (two) times daily with a meal.  02/16/11   [provider]  Multiple Vitamins-Minerals (MULTIVITAMIN WITH MINERALS) tablet Take 1 tablet by mouth daily.  02/16/11   [provider]  pioglitazone (ACTOS) 30 MG tablet  05/14/15   [provider]  SYNTHROID 88 MCG tablet Take 1 tablet (88 mcg total) by mouth daily before breakfast. 07/19/14   Armandina Gemma, MD  traMADol (ULTRAM) 50 MG tablet Take 1 tablet (50 mg total) by mouth daily as needed for moderate pain. 02/25/15   Ghimire, Henreitta Leber, MD    Family History Family History  Problem Relation Age of Onset  . Hypertension Father   . Diabetes Father   . Hypertension Sister   . Diabetes Sister     Social History Social History  Substance Use Topics  . Smoking status: Former Smoker    Packs/day: 0.25    Years: 5.00    Types: Cigarettes    Quit date: 05/24/1970  . Smokeless tobacco: Never Used  . Alcohol use No     Comment: 07/18/2014 "quit drinking in the 1980's"     Allergies   Topamax [topiramate]   Review of Systems Review of Systems  Musculoskeletal: Positive for arthralgias and myalgias.  Neurological: Negative for weakness.     Physical Exam Updated Vital Signs BP (!) 155/71 (BP Location: Left Arm)   Pulse 77   Temp 98.7 F (37.1 C) (Oral)   Resp 17   SpO2 100%   Physical Exam  Constitutional: She appears well-developed and well-nourished. No distress.  HENT:  Head: Normocephalic and atraumatic.  Neck: Neck supple.    Cardiovascular: Normal rate, regular rhythm and normal heart sounds.   No murmur heard. Pulmonary/Chest: Effort normal and breath sounds normal. No respiratory distress. She has no wheezes. She has no rales.  Musculoskeletal: Normal range of motion.  Right knee with tenderness to palpation of patellar tendon and medial aspect of the knee. No joint line tenderness. Full ROM. No joint effusion or swelling appreciated. No abnormal alignment or patellar mobility. No bruising, erythema or warmth overlaying the joint. Mild increase in valgus laxity when compared to unaffected leg. Negative drawer's, Lachman's and McMurray's.  No crepitus. 2+ DP pulses bilaterally. All compartments are soft. Sensation intact distal to injury.  Neurological: She is  alert.  Skin: Skin is warm and dry.  Nursing note and vitals reviewed.    ED Treatments / Results  DIAGNOSTIC STUDIES:  Oxygen Saturation is 100% on RA, normal by my interpretation.    COORDINATION OF CARE:  11:36 AM Will order XR of the right knee. Discussed treatment plan with pt at bedside and pt agreed to plan.  Labs (all labs ordered are listed, but only abnormal results are displayed) Labs Reviewed - No data to display  EKG  EKG Interpretation None       Radiology Dg Knee Complete 4 Views Right  Result Date: 10/15/2016 CLINICAL DATA:  Right knee pain and swelling for 3 weeks. EXAM: RIGHT KNEE - COMPLETE 4+ VIEW COMPARISON:  None. FINDINGS: There is no evidence of acute fracture or dislocation. There is a small knee joint effusion. Moderate superior and inferior patellar enthesophytes are present. There is mild tibial spine spurring. Femorotibial joint space widths are preserved. IMPRESSION: Small knee joint effusion without evidence of acute osseous abnormality. Electronically Signed   By: Logan Bores M.D.   On: 10/15/2016 12:40    Procedures Procedures (including critical care time)  Medications Ordered in ED Medications - No  data to display   Initial Impression / Assessment and Plan / ED Course  I have reviewed the triage vital signs and the nursing notes.  Pertinent labs & imaging results that were available during my care of the patient were reviewed by me and considered in my medical decision making (see chart for details).    Shaquana Buel Landowski is a 66 y.o. female who presents to ED for right knee pain x 3 weeks. On exam, patient with tenderness to palpation of patellar tendon and right aspect of knee. No decreased ROM and able to ambulate without assistance. No concerns for septic joint. RLE NVI. X-ray with small effusion but otherwise negative. Patient did have very slight amount of valgus laxity when compared to affected side. Again does have medial tenderness as well. Will place in knee sleeve, RICE and have her follow up with ortho - referral info given. Voltaren gel rx given. Tylenol or advil as needed. Offered crutches but she declined. Reasons to return to ER and home care instructions discussed. All questions answered.    Final Clinical Impressions(s) / ED Diagnoses   Final diagnoses:  Sprain of right knee, unspecified ligament, initial encounter    New Prescriptions New Prescriptions   DICLOFENAC SODIUM (VOLTAREN) 1 % GEL    Apply 2 g topically 4 (four) times daily.   I personally performed the services described in this documentation, which was scribed in my presence. The recorded information has been reviewed and is accurate.    Kesha Hurrell, Ozella Almond, PA-C 10/15/16 1300    Pattricia Boss, MD 10/15/16 [phone]

## 2016-10-15 NOTE — ED Notes (Signed)
Patient transported to X-ray 

## 2016-10-15 NOTE — ED Triage Notes (Signed)
Pt reports right knee pain x 3 weeks; heard a pop when she was walking. Pt is ambulatory at this time, NAD.

## 2016-10-15 NOTE — Discharge Instructions (Signed)
It was my pleasure taking care of you today!   Apply prescription cream up to four times a day as needed for pain. You can also take tylenol for additional pain relief. Ice and elevate knee throughout the day.  Call the orthopedist listed today to schedule follow up appointment for recheck of ongoing knee pain.  Return to the ER for new or worsening symptoms, any additional concerns.

## 2016-10-29 ENCOUNTER — Ambulatory Visit (INDEPENDENT_AMBULATORY_CARE_PROVIDER_SITE_OTHER): Payer: Medicare Other | Admitting: Orthopedic Surgery

## 2016-11-01 ENCOUNTER — Ambulatory Visit (INDEPENDENT_AMBULATORY_CARE_PROVIDER_SITE_OTHER): Payer: Medicare Other | Admitting: Orthopedic Surgery

## 2016-11-01 ENCOUNTER — Encounter (INDEPENDENT_AMBULATORY_CARE_PROVIDER_SITE_OTHER): Payer: Self-pay | Admitting: Orthopedic Surgery

## 2016-11-01 VITALS — Ht 63.0 in | Wt 178.0 lb

## 2016-11-01 DIAGNOSIS — M25561 Pain in right knee: Secondary | ICD-10-CM

## 2016-11-01 MED ORDER — LIDOCAINE HCL 1 % IJ SOLN
5.0000 mL | INTRAMUSCULAR | Status: AC | PRN
  Administered 2016-11-01: 5 mL

## 2016-11-01 MED ORDER — METHYLPREDNISOLONE ACETATE 40 MG/ML IJ SUSP
40.0000 mg | INTRAMUSCULAR | Status: AC | PRN
  Administered 2016-11-01: 40 mg via INTRA_ARTICULAR

## 2016-11-01 NOTE — Progress Notes (Signed)
Office Visit Note   Patient: Jeanette [name]           Date of Birth: 22-Jun-1950           MRN: [id number] Visit Date: 11/01/2016              Requested by: Bartholome Bill, MD [address] [place], [place] [address] PCP: Bartholome Bill, MD  Chief Complaint  Patient presents with  . Right Knee - Pain    Follow up from ER 10/15/16      HPI: Patient is a 66 year old woman who states about 3 weeks ago she had an acute pop in her right knee while walking. She went to the emergency room radiographs were obtained which were reported as normal. She was given a knee sleeve resection verbal tear in gel she states that she has had persistent pain anteriorly since her popping episode she states she has decreased range of motion she denies any swelling she has pain with ambulation.  Patient's medical history is reviewed and she does have type 2 diabetes hypertension and she is status post thyroidectomy hysterectomy tonsillectomy and bunionectomy left rest cancer with mastectomy. Assessment & Plan: Visit Diagnoses:  1. Acute pain of right knee     Plan: The knee was injected we will follow up in 4 weeks. Patient may benefit from hyaluronic acid injection. We'll reevaluate in follow-up.  Follow-Up Instructions: Return in about 4 weeks (around 11/29/2016).   Ortho Exam  Patient is alert, oriented, no adenopathy, well-dressed, normal affect, normal respiratory effort. Patient has antalgic gait. Examination she is tender to palpation of the patellofemoral joint as well as medial and lateral joint lines. Clausen cruciate are stable. She has a mild effusion. There is no redness no cellulitis no open wounds.  Imaging: No results found.  Labs: Lab Results  Component Value Date   HGBA1C 6.3 (H) 02/23/2015   ESRSEDRATE 9 02/23/2015    Orders:  No orders of the defined types were placed in this encounter.  No orders of the defined types were placed in this  encounter.    Procedures: Large Joint Inj Date/Time: 11/01/2016 9:36 AM Performed by: DUDA, MARCUS V Authorized by: Newt Minion   Consent Given by:  Patient Site marked: the procedure site was marked   Timeout: prior to procedure the correct patient, procedure, and site was verified   Indications:  Pain and diagnostic evaluation Location:  Knee Site:  R knee Prep: patient was prepped and draped in usual sterile fashion   Needle Size:  22 G Needle Length:  1.5 inches Approach:  Anteromedial Ultrasound Guidance: No   Fluoroscopic Guidance: No   Arthrogram: No   Medications:  5 mL lidocaine 1 %; 40 mg methylPREDNISolone acetate 40 MG/ML Aspiration Attempted: No   Patient tolerance:  Patient tolerated the procedure well with no immediate complications    Clinical Data: No additional findings.  ROS:  All other systems negative, except as noted in the HPI. Review of Systems  Objective: Vital Signs: Ht 5\' 3"  (1.6 m)   Wt 178 lb (80.7 kg)   BMI 31.53 kg/m   Specialty Comments:  No specialty comments available.  PMFS History: Patient Active Problem List   Diagnosis Date Noted  . Migraine 03/19/2015  . Abnormal MRI of the head 03/19/2015  . CKD stage 3 due to type 2 diabetes mellitus ([place]) 02/24/2015  . Chronic diastolic CHF (congestive heart failure) ([place]) 02/24/2015  . Acute  CVA (cerebrovascular accident) ([place])   . 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 Multiple thyroid nodules 07/18/2014  . HLD (hyperlipidemia) 03/26/2014  . Breast cancer of upper-outer quadrant of left female breast ([place]) 03/22/2014  . 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 Diabetes ([place]) 02/05/2014  . Thyroid nodule 02/05/2014  . H/O malignant neoplasm of breast 02/05/2014  . Essential (primary) hypertension 02/05/2014   Past Medical History:  Diagnosis Date  . Anemia   . Arthritis    "back" (07/18/2014)  . Cancer of  left breast ([place])   . Chronic back pain   . Headache   . Heart murmur    many years ago  . Hyperlipidemia   . Hypertension   . Hypothyroidism    thyroidectomy 07/18/2014  . Neuromuscular disorder (HCC)    neuropathy hands and feet  . Pneumonia ~ 2010  . Stroke ([place])   . Type II diabetes mellitus (HCC)     Family History  Problem Relation Age of Onset  . Hypertension Father   . Diabetes Father   . Hypertension Sister   . Diabetes Sister     Past Surgical History:  Procedure Laterality Date  . ABDOMINAL HYSTERECTOMY  1970's  . APPENDECTOMY    . BILATERAL OOPHORECTOMY  1980j's  . BREAST BIOPSY Left 2008  . BUNIONECTOMY Right   . CARDIAC CATHETERIZATION  02/16/2011   NL coronares, EF 65%, no AS or MR (Dr. Corliss Parish; [place])  . CARPAL TUNNEL RELEASE Bilateral   . MASTECTOMY Left 2008  . TEE WITHOUT CARDIOVERSION N/A 02/25/2015   Procedure: TRANSESOPHAGEAL ECHOCARDIOGRAM (TEE);  Surgeon: Sueanne Margarita, MD;  Location[facility] ENDOSCOPY;  Service: Cardiovascular;  Laterality: N/A;  . THYROIDECTOMY N/A 07/18/2014   Procedure: TOTAL THYROIDECTOMY;  Surgeon: Armandina Gemma, MD;  Location: [facility];  Service: General;  Laterality: N/A;  . TONSILLECTOMY    . TOTAL THYROIDECTOMY  07/18/2014   Social History   Occupational History  . Not on file.   Social History Main Topics  . Smoking status: Former Smoker    Packs/day: 0.25    Years: 5.00    Types: Cigarettes    Quit date: 05/24/1970  . Smokeless tobacco: Never Used  . Alcohol use No     Comment: 07/18/2014 "quit drinking in the 1980's"  . Drug use: Yes    Types: Marijuana     Comment: occassionally way back when  . Sexual activity: Not on file

## 2016-11-05 ENCOUNTER — Other Ambulatory Visit: Payer: Self-pay | Admitting: Family Medicine

## 2016-11-05 DIAGNOSIS — Z9012 Acquired absence of left breast and nipple: Secondary | ICD-10-CM

## 2016-11-05 DIAGNOSIS — Z853 Personal history of malignant neoplasm of breast: Secondary | ICD-10-CM

## 2016-11-29 ENCOUNTER — Ambulatory Visit (INDEPENDENT_AMBULATORY_CARE_PROVIDER_SITE_OTHER): Payer: Medicare Other | Admitting: Orthopedic Surgery

## 2016-11-29 ENCOUNTER — Encounter (INDEPENDENT_AMBULATORY_CARE_PROVIDER_SITE_OTHER): Payer: Self-pay | Admitting: Orthopedic Surgery

## 2016-11-29 DIAGNOSIS — M25561 Pain in right knee: Secondary | ICD-10-CM

## 2016-11-29 DIAGNOSIS — M7542 Impingement syndrome of left shoulder: Secondary | ICD-10-CM

## 2016-11-29 MED ORDER — METHYLPREDNISOLONE ACETATE 40 MG/ML IJ SUSP
40.0000 mg | INTRAMUSCULAR | Status: AC | PRN
  Administered 2016-11-29: 40 mg via INTRA_ARTICULAR

## 2016-11-29 MED ORDER — LIDOCAINE HCL 1 % IJ SOLN
5.0000 mL | INTRAMUSCULAR | Status: AC | PRN
  Administered 2016-11-29: 5 mL

## 2016-11-29 NOTE — Progress Notes (Signed)
Office Visit Note   Patient: Jeanette [name]           Date of Birth: 1951-04-17           MRN: [id number] Visit Date: 11/29/2016              Requested by: Bartholome Bill, MD [address] [place], [place] [address] PCP: Bartholome Bill, MD  Chief Complaint  Patient presents with  . Right Knee - Follow-up    Doing great  . Left Shoulder - Pain    Onset x 3 weeks ago, no known injury      HPI: Patient is a 66 year old woman who states about 7 weeks ago she had an acute pop in her right knee while walking. She went to the emergency room radiographs were obtained which were reported as normal. She was given a knee sleeve and voltaren gel she states that she has had persistent pain anteriorly since her popping episode she states she has decreased range of motion she denies any swelling she has pain with ambulation.  Today states her knee pain has resolved following depomedrol injection 4 weeks ago.   Is complaining of a 3 week history of left shoulder pain. No known injury. Pain with above head and behind back reaching. Aching constant pain. Feels deep in shoulder. No relief from over the counters.   Patient's medical history is reviewed and she does have type 2 diabetes hypertension and she is status post thyroidectomy hysterectomy tonsillectomy and bunionectomy left rest cancer with mastectomy.  Assessment & Plan: Visit Diagnoses:  1. Impingement syndrome of left shoulder   2. Acute pain of right knee     Plan: The left shoulder was injected we will follow up in 4 weeks. If continued pain.  Follow-Up Instructions: Return in about 4 weeks (around 12/27/2016), or if symptoms worsen or fail to improve.   Left Shoulder Exam   Tenderness  The patient is experiencing tenderness in the biceps tendon.  Range of Motion  The patient has normal left shoulder ROM.  Tests  Cross Arm: negative Impingement: positive  Other  Pulse: present       Patient is  alert, oriented, no adenopathy, well-dressed, normal affect, normal respiratory effort. Patient has antalgic gait.  Imaging: No results found.  Labs: Lab Results  Component Value Date   HGBA1C 6.3 (H) 02/23/2015   ESRSEDRATE 9 02/23/2015    Orders:  No orders of the defined types were placed in this encounter.  No orders of the defined types were placed in this encounter.    Procedures: Large Joint Inj Date/Time: 11/29/2016 9:26 AM Performed by: Suzan Slick Authorized by: Dondra Prader R   Consent Given by:  Patient Site marked: the procedure site was marked   Timeout: prior to procedure the correct patient, procedure, and site was verified   Indications:  Pain and diagnostic evaluation Location:  Shoulder Site:  L subacromial bursa Prep: patient was prepped and draped in usual sterile fashion   Needle Size:  22 G Needle Length:  1.5 inches Ultrasound Guidance: No   Fluoroscopic Guidance: No   Arthrogram: No   Medications:  5 mL lidocaine 1 %; 40 mg methylPREDNISolone acetate 40 MG/ML Aspiration Attempted: No   Patient tolerance:  Patient tolerated the procedure well with no immediate complications    Clinical Data: No additional findings.  ROS:  All other systems negative, except as noted in the HPI. Review of Systems  Constitutional: Negative for chills and fever.  Musculoskeletal: Positive for arthralgias.    Objective: Vital Signs: There were no vitals taken for this visit.  Specialty Comments:  No specialty comments available.  PMFS History: Patient Active Problem List   Diagnosis Date Noted  . Migraine 03/19/2015  . Abnormal MRI of the head 03/19/2015  . CKD stage 3 due to type 2 diabetes mellitus ([place]) 02/24/2015  . Chronic diastolic CHF (congestive heart failure) ([place]) 02/24/2015  . Acute CVA (cerebrovascular accident) ([place])   . 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  Multiple thyroid nodules 07/18/2014  . HLD (hyperlipidemia) 03/26/2014  . Breast cancer of upper-outer quadrant of left female breast ([place]) 03/22/2014  . 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 Diabetes ([place]) 02/05/2014  . Thyroid nodule 02/05/2014  . H/O malignant neoplasm of breast 02/05/2014  . Essential (primary) hypertension 02/05/2014   Past Medical History:  Diagnosis Date  . Anemia   . Arthritis    "back" (07/18/2014)  . Cancer of left breast ([place])   . Chronic back pain   . Headache   . Heart murmur    many years ago  . Hyperlipidemia   . Hypertension   . Hypothyroidism    thyroidectomy 07/18/2014  . Neuromuscular disorder (HCC)    neuropathy hands and feet  . Pneumonia ~ 2010  . Stroke ([place])   . Type II diabetes mellitus (HCC)     Family History  Problem Relation Age of Onset  . Hypertension Father   . Diabetes Father   . Hypertension Sister   . Diabetes Sister     Past Surgical History:  Procedure Laterality Date  . ABDOMINAL HYSTERECTOMY  1970's  . APPENDECTOMY    . BILATERAL OOPHORECTOMY  1980j's  . BREAST BIOPSY Left 2008  . BUNIONECTOMY Right   . CARDIAC CATHETERIZATION  02/16/2011   NL coronares, EF 65%, no AS or MR (Dr. Corliss Parish; [place])  . CARPAL TUNNEL RELEASE Bilateral   . MASTECTOMY Left 2008  . TEE WITHOUT CARDIOVERSION N/A 02/25/2015   Procedure: TRANSESOPHAGEAL ECHOCARDIOGRAM (TEE);  Surgeon: Sueanne Margarita, MD;  Location: [facility] ENDOSCOPY;  Service: Cardiovascular;  Laterality: N/A;  . THYROIDECTOMY N/A 07/18/2014   Procedure: TOTAL THYROIDECTOMY;  Surgeon: Armandina Gemma, MD;  Location: [facility];  Service: General;  Laterality: N/A;  . TONSILLECTOMY    . TOTAL THYROIDECTOMY  07/18/2014   Social History   Occupational History  . Not on file.   Social History Main Topics  . Smoking status: Former Smoker    Packs/day: 0.25    Years: 5.00    Types: Cigarettes    Quit date: 05/24/1970  . Smokeless tobacco: Never Used  . Alcohol  use No     Comment: 07/18/2014 "quit drinking in the 1980's"  . Drug use: Yes    Types: Marijuana     Comment: occassionally way back when  . Sexual activity: Not on file

## 2016-12-10 ENCOUNTER — Ambulatory Visit
Admission: RE | Admit: 2016-12-10 | Discharge: 2016-12-10 | Disposition: A | Discharge status: Home / Self Care | Payer: Medicare Other | Source: Ambulatory Visit | Attending: Family Medicine | Admitting: Family Medicine

## 2016-12-10 DIAGNOSIS — Z9012 Acquired absence of left breast and nipple: Secondary | ICD-10-CM

## 2016-12-10 DIAGNOSIS — Z853 Personal history of malignant neoplasm of breast: Secondary | ICD-10-CM

## 2017-05-03 ENCOUNTER — Emergency Department (HOSPITAL_COMMUNITY)
Admission: EM | Admit: 2017-05-03 | Discharge: 2017-05-04 | Disposition: A | Discharge status: Home / Self Care | Payer: Medicare Other | Attending: Emergency Medicine | Admitting: Emergency Medicine

## 2017-05-03 DIAGNOSIS — I10 Essential (primary) hypertension: Secondary | ICD-10-CM

## 2017-05-03 DIAGNOSIS — Z7902 Long term (current) use of antithrombotics/antiplatelets: Secondary | ICD-10-CM | POA: Insufficient documentation

## 2017-05-03 DIAGNOSIS — N183 Chronic kidney disease, stage 3 (moderate): Secondary | ICD-10-CM | POA: Diagnosis not present

## 2017-05-03 DIAGNOSIS — I5032 Chronic diastolic (congestive) heart failure: Secondary | ICD-10-CM | POA: Diagnosis not present

## 2017-05-03 DIAGNOSIS — G43009 Migraine without aura, not intractable, without status migrainosus: Secondary | ICD-10-CM | POA: Diagnosis not present

## 2017-05-03 DIAGNOSIS — E119 Type 2 diabetes mellitus without complications: Secondary | ICD-10-CM | POA: Diagnosis not present

## 2017-05-03 DIAGNOSIS — Z87891 Personal history of nicotine dependence: Secondary | ICD-10-CM | POA: Diagnosis not present

## 2017-05-03 DIAGNOSIS — Z79899 Other long term (current) drug therapy: Secondary | ICD-10-CM | POA: Insufficient documentation

## 2017-05-03 DIAGNOSIS — I130 Hypertensive heart and chronic kidney disease with heart failure and stage 1 through stage 4 chronic kidney disease, or unspecified chronic kidney disease: Secondary | ICD-10-CM | POA: Diagnosis not present

## 2017-05-03 DIAGNOSIS — Z853 Personal history of malignant neoplasm of breast: Secondary | ICD-10-CM | POA: Insufficient documentation

## 2017-05-03 DIAGNOSIS — R51 Headache: Secondary | ICD-10-CM | POA: Diagnosis present

## 2017-05-03 DIAGNOSIS — J0130 Acute sphenoidal sinusitis, unspecified: Secondary | ICD-10-CM | POA: Diagnosis not present

## 2017-05-03 DIAGNOSIS — J323 Chronic sphenoidal sinusitis: Secondary | ICD-10-CM

## 2017-05-04 ENCOUNTER — Emergency Department (HOSPITAL_COMMUNITY): Payer: Medicare Other

## 2017-05-04 ENCOUNTER — Encounter (HOSPITAL_COMMUNITY): Payer: Self-pay

## 2017-05-04 ENCOUNTER — Encounter (HOSPITAL_COMMUNITY): Payer: Self-pay | Admitting: Emergency Medicine

## 2017-05-04 ENCOUNTER — Other Ambulatory Visit: Payer: Self-pay

## 2017-05-04 ENCOUNTER — Telehealth: Payer: Self-pay | Admitting: Neurology

## 2017-05-04 DIAGNOSIS — I5032 Chronic diastolic (congestive) heart failure: Secondary | ICD-10-CM

## 2017-05-04 DIAGNOSIS — Z8673 Personal history of transient ischemic attack (TIA), and cerebral infarction without residual deficits: Secondary | ICD-10-CM | POA: Insufficient documentation

## 2017-05-04 DIAGNOSIS — E119 Type 2 diabetes mellitus without complications: Secondary | ICD-10-CM | POA: Insufficient documentation

## 2017-05-04 DIAGNOSIS — Z7984 Long term (current) use of oral hypoglycemic drugs: Secondary | ICD-10-CM

## 2017-05-04 DIAGNOSIS — I130 Hypertensive heart and chronic kidney disease with heart failure and stage 1 through stage 4 chronic kidney disease, or unspecified chronic kidney disease: Secondary | ICD-10-CM | POA: Insufficient documentation

## 2017-05-04 DIAGNOSIS — E039 Hypothyroidism, unspecified: Secondary | ICD-10-CM

## 2017-05-04 DIAGNOSIS — Z87891 Personal history of nicotine dependence: Secondary | ICD-10-CM

## 2017-05-04 DIAGNOSIS — N183 Chronic kidney disease, stage 3 (moderate): Secondary | ICD-10-CM

## 2017-05-04 DIAGNOSIS — R51 Headache: Secondary | ICD-10-CM

## 2017-05-04 DIAGNOSIS — Z7982 Long term (current) use of aspirin: Secondary | ICD-10-CM

## 2017-05-04 DIAGNOSIS — Z79899 Other long term (current) drug therapy: Secondary | ICD-10-CM

## 2017-05-04 DIAGNOSIS — E785 Hyperlipidemia, unspecified: Secondary | ICD-10-CM

## 2017-05-04 LAB — BASIC METABOLIC PANEL
ANION GAP: 8 (ref 5–15)
BUN: 12 mg/dL (ref 6–20)
CALCIUM: 9.3 mg/dL (ref 8.9–10.3)
CHLORIDE: 104 mmol/L (ref 101–111)
CO2: 26 mmol/L (ref 22–32)
CREATININE: 1.19 mg/dL — AB (ref 0.44–1.00)
GFR calc non Af Amer: 47 mL/min — ABNORMAL LOW (ref >60)
GFR, EST AFRICAN AMERICAN: 54 mL/min — AB (ref >60)
Glucose, Bld: 127 mg/dL — ABNORMAL HIGH (ref 65–99)
Potassium: 3.7 mmol/L (ref 3.5–5.1)
SODIUM: 138 mmol/L (ref 135–145)

## 2017-05-04 LAB — I-STAT CHEM 8, ED
BUN: 9 mg/dL (ref 6–20)
CALCIUM ION: 1.17 mmol/L (ref 1.15–1.40)
CREATININE: 1 mg/dL (ref 0.44–1.00)
Chloride: 105 mmol/L (ref 101–111)
GLUCOSE: 104 mg/dL — AB (ref 65–99)
HCT: 40 % (ref 36.0–46.0)
HEMOGLOBIN: 13.6 g/dL (ref 12.0–15.0)
Potassium: 3.7 mmol/L (ref 3.5–5.1)
Sodium: 145 mmol/L (ref 135–145)
TCO2: 27 mmol/L (ref 22–32)

## 2017-05-04 LAB — PROTIME-INR
INR: 0.95
Prothrombin Time: 12.6 seconds (ref 11.4–15.2)

## 2017-05-04 LAB — CBC WITH DIFFERENTIAL/PLATELET
Basophils Absolute: 0 10*3/uL (ref 0.0–0.1)
Basophils Relative: 0 %
EOS PCT: 1 %
Eosinophils Absolute: 0.1 10*3/uL (ref 0.0–0.7)
HEMATOCRIT: 40 % (ref 36.0–46.0)
Hemoglobin: 12.8 g/dL (ref 12.0–15.0)
LYMPHS PCT: 48 %
Lymphs Abs: 2.5 10*3/uL (ref 0.7–4.0)
MCH: 26.6 pg (ref 26.0–34.0)
MCHC: 32 g/dL (ref 30.0–36.0)
MCV: 83 fL (ref 78.0–100.0)
MONO ABS: 0.3 10*3/uL (ref 0.1–1.0)
MONOS PCT: 7 %
NEUTROS ABS: 2.2 10*3/uL (ref 1.7–7.7)
Neutrophils Relative %: 44 %
PLATELETS: 261 10*3/uL (ref 150–400)
RBC: 4.82 MIL/uL (ref 3.87–5.11)
RDW: 14.6 % (ref 11.5–15.5)
WBC: 5.1 10*3/uL (ref 4.0–10.5)

## 2017-05-04 LAB — URINALYSIS, ROUTINE W REFLEX MICROSCOPIC
Bacteria, UA: NONE SEEN
Bilirubin Urine: NEGATIVE
Bilirubin Urine: NEGATIVE
GLUCOSE, UA: NEGATIVE mg/dL
Glucose, UA: NEGATIVE mg/dL
Hgb urine dipstick: NEGATIVE
Hgb urine dipstick: NEGATIVE
Ketones, ur: NEGATIVE mg/dL
Ketones, ur: NEGATIVE mg/dL
LEUKOCYTES UA: NEGATIVE
Nitrite: NEGATIVE
Nitrite: NEGATIVE
PROTEIN: NEGATIVE mg/dL
PROTEIN: NEGATIVE mg/dL
SPECIFIC GRAVITY, URINE: 1.004 — AB (ref 1.005–1.030)
SQUAMOUS EPITHELIAL / LPF: NONE SEEN
Specific Gravity, Urine: 1.002 — ABNORMAL LOW (ref 1.005–1.030)
pH: 6 (ref 5.0–8.0)
pH: 7 (ref 5.0–8.0)

## 2017-05-04 LAB — CBC
HCT: 40.6 % (ref 36.0–46.0)
Hemoglobin: 13.2 g/dL (ref 12.0–15.0)
MCH: 27.1 pg (ref 26.0–34.0)
MCHC: 32.5 g/dL (ref 30.0–36.0)
MCV: 83.4 fL (ref 78.0–100.0)
PLATELETS: 227 10*3/uL (ref 150–400)
RBC: 4.87 MIL/uL (ref 3.87–5.11)
RDW: 14.5 % (ref 11.5–15.5)
WBC: 6.8 10*3/uL (ref 4.0–10.5)

## 2017-05-04 MED ORDER — METOCLOPRAMIDE HCL 5 MG/ML IJ SOLN
10.0000 mg | Freq: Once | INTRAMUSCULAR | Status: AC
  Administered 2017-05-04: 10 mg via INTRAVENOUS
  Filled 2017-05-04: qty 2

## 2017-05-04 MED ORDER — DIPHENHYDRAMINE HCL 50 MG/ML IJ SOLN
12.5000 mg | Freq: Once | INTRAMUSCULAR | Status: AC
  Administered 2017-05-04: 12.5 mg via INTRAVENOUS
  Filled 2017-05-04: qty 1

## 2017-05-04 MED ORDER — HYDRALAZINE HCL 20 MG/ML IJ SOLN
2.0000 mg | Freq: Once | INTRAMUSCULAR | Status: AC
  Administered 2017-05-04: 2 mg via INTRAVENOUS
  Filled 2017-05-04: qty 1

## 2017-05-04 MED ORDER — AMOXICILLIN-POT CLAVULANATE 875-125 MG PO TABS: 1.0000 | ORAL_TABLET | Freq: Two times a day (BID) | ORAL | 0 refills | Status: DC

## 2017-05-04 MED ORDER — DIVALPROEX SODIUM ER 500 MG PO TB24: 500.0000 mg | ORAL_TABLET | Freq: Two times a day (BID) | ORAL | 1 refills | Status: DC

## 2017-05-04 MED ORDER — AMOXICILLIN-POT CLAVULANATE 875-125 MG PO TABS
1.0000 | ORAL_TABLET | Freq: Once | ORAL | Status: AC
  Administered 2017-05-04: 1 via ORAL
  Filled 2017-05-04: qty 1

## 2017-05-04 MED ORDER — HYDRALAZINE HCL 20 MG/ML IJ SOLN: 2.0000 mg | Freq: Once | INTRAMUSCULAR | Status: DC

## 2017-05-04 MED ORDER — KETOROLAC TROMETHAMINE 30 MG/ML IJ SOLN
15.0000 mg | Freq: Once | INTRAMUSCULAR | Status: AC
  Administered 2017-05-04: 15 mg via INTRAVENOUS
  Filled 2017-05-04: qty 1

## 2017-05-04 NOTE — Telephone Encounter (Signed)
Refill done for 2 months only. Pt was last seen 06/2016. Pt was a no show at last appt 09/2016.

## 2017-05-04 NOTE — ED Triage Notes (Signed)
Pt from home, has had a headache since 5pm along w/ high blood pressure.  Pt worried b/c she has CVA in 2016 at which time she was "asymptomatic except for a headache."  Pt on blood thinners, reports has been dizzy when standing for the past month.

## 2017-05-04 NOTE — Telephone Encounter (Signed)
Pt request refill for divalproex (DEPAKOTE ER) 500 MG 24 hr tablet sent to [facility]. Please d/c [facility] as pharmacy. Pt is out of the medication for the past week. Pt also has appt with Hoyle Sauer 08/02/17.

## 2017-05-04 NOTE — ED Notes (Signed)
As I was triaging pt she reported she had to use the bathroom, pt was offered a bedpan and or bedside commode which she admittedly refused.  RN attempted to educate pt about walking when she was dizzy however she stated she was going to walk to the bathroom.  Pt was escorted by tec.

## 2017-05-04 NOTE — ED Provider Notes (Signed)
[facility] EMERGENCY DEPARTMENT Provider Note   CSN: [id number] Arrival date & time: 05/03/17  2355     History   Chief Complaint Chief Complaint  Patient presents with  . Headache  . Hypertension    HPI Jeanette [name] is a 66 y.o. female.  The history is provided by the patient.  Headache   This is a recurrent problem. The current episode started 6 to 12 hours ago. The problem occurs constantly. The problem has not changed since onset.The headache is associated with nothing. Pain location: band around the entire head. The quality of the pain is described as dull. The pain is severe. The pain does not radiate. Pertinent negatives include no anorexia, no fever and no palpitations. She has tried nothing for the symptoms. The treatment provided no relief.  Hypertension  This is a chronic problem. The current episode started more than 1 week ago. The problem occurs constantly. The problem has been rapidly worsening. Associated symptoms include headaches. Nothing aggravates the symptoms. Nothing relieves the symptoms. She has tried nothing for the symptoms. The treatment provided no relief.    Past Medical History:  Diagnosis Date  . Anemia   . Arthritis    "back" (07/18/2014)  . Cancer of left breast ([place])   . Chronic back pain   . Headache   . Heart murmur    many years ago  . Hyperlipidemia   . Hypertension   . Hypothyroidism    thyroidectomy 07/18/2014  . Neuromuscular disorder (H[facility])    neuropathy hands and feet  . Pneumonia ~ 2010  . Stroke ([place])   . Type II diabetes mellitus [facility])     Patient Active Problem List   Diagnosis Date Noted  . Migraine 03/19/2015  . Abnormal MRI of the head 03/19/2015  . CKD stage 3 due to type 2 diabetes mellitus ([place]) 02/24/2015  . Chronic diastolic CHF (congestive heart failure) ([place]) 02/24/2015  . Acute CVA (cerebrovascular accident) ([place])   . 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 Multiple thyroid nodules 07/18/2014  . HLD (hyperlipidemia) 03/26/2014  . Breast cancer of upper-outer quadrant of left female breast ([place]) 03/22/2014  . 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 Diabetes ([place]) 02/05/2014  . Thyroid nodule 02/05/2014  . H/O malignant neoplasm of breast 02/05/2014  . Essential (primary) hypertension 02/05/2014    Past Surgical History:  Procedure Laterality Date  . ABDOMINAL HYSTERECTOMY  1970's  . APPENDECTOMY    . BILATERAL OOPHORECTOMY  1980j's  . BREAST BIOPSY Left 2008  . BUNIONECTOMY Right   . CARDIAC CATHETERIZATION  02/16/2011   NL coronares, EF 65%, no AS or MR (Dr. Corliss Parish; [place])  . CARPAL TUNNEL RELEASE Bilateral   . MASTECTOMY Left 2008  . REDUCTION MAMMAPLASTY Right   . TEE WITHOUT CARDIOVERSION N/A 02/25/2015   Procedure: TRANSESOPHAGEAL ECHOCARDIOGRAM (TEE);  Surgeon: Sueanne Margarita, MD;  Location: [facility] ENDOSCOPY;  Service: Cardiovascular;  Laterality: N/A;  . THYROIDECTOMY N/A 07/18/2014   Procedure: TOTAL THYROIDECTOMY;  Surgeon: Armandina Gemma, MD;  Location: [facility];  Service: General;  Laterality: N/A;  . TONSILLECTOMY    . TOTAL THYROIDECTOMY  07/18/2014    OB History    No data available       Home Medications    Prior to Admission medications   Medication Sig Start Date End Date Taking? Authorizing Provider  ACCU-CHEK AVIVA PLUS test strip  02/27/15   [provider]  aspirin EC 81 MG tablet Take 81 mg by mouth daily.    [provider]  atorvastatin (LIPITOR) 20 MG tablet Take 20 mg by mouth every morning.  02/16/11   [provider]  calcium-vitamin D (OSCAL WITH D) 500-200 MG-UNIT per tablet Take 2 tablets by mouth 2 (two) times daily. 07/19/14   Armandina Gemma, MD  clopidogrel (PLAVIX) 75 MG tablet Take 1 tablet (75 mg total) by mouth daily. 04/02/15   Garvin Fila, MD  diclofenac sodium (VOLTAREN) 1 % GEL Apply 2 g topically 4 (four) times  daily. 10/15/16   Ward, Ozella Almond, PA-C  divalproex (DEPAKOTE ER) 500 MG 24 hr tablet Take 1 tablet (500 mg total) by mouth 2 (two) times daily. 07/07/16   Garvin Fila, MD  gabapentin (NEURONTIN) 300 MG capsule Take 300 mg by mouth 3 (three) times daily.    [provider]  hydrochlorothiazide (HYDRODIURIL) 12.5 MG tablet Take 12.5 mg by mouth daily. 02/07/15   [provider]  losartan (COZAAR) 100 MG tablet Take 100 mg by mouth daily.  02/16/11   [provider]  metFORMIN (GLUCOPHAGE) 1000 MG tablet Take 1,000 mg by mouth 2 (two) times daily with a meal.  02/16/11   [provider]  Multiple Vitamins-Minerals (MULTIVITAMIN WITH MINERALS) tablet Take 1 tablet by mouth daily.  02/16/11   [provider]  pioglitazone (ACTOS) 30 MG tablet  05/14/15   [provider]  SYNTHROID 88 MCG tablet Take 1 tablet (88 mcg total) by mouth daily before breakfast. 07/19/14   Armandina Gemma, MD  traMADol (ULTRAM) 50 MG tablet Take 1 tablet (50 mg total) by mouth daily as needed for moderate pain. 02/25/15   Ghimire, Henreitta Leber, MD    Family History Family History  Problem Relation Age of Onset  . Hypertension Father   . Diabetes Father   . Hypertension Sister   . Diabetes Sister     Social History Social History   Tobacco Use  . Smoking status: Former Smoker    Packs/day: 0.25    Years: 5.00    Pack years: 1.25    Types: Cigarettes    Last attempt to quit: 05/24/1970    Years since quitting: 46.9  . Smokeless tobacco: Never Used  Substance Use Topics  . Alcohol use: No    Comment: 07/18/2014 "quit drinking in the 1980's"  . Drug use: Yes    Types: Marijuana    Comment: occassionally way back when     Allergies   Topamax [topiramate]   Review of Systems Review of Systems  Constitutional: Negative for fever.  Eyes: Negative for photophobia.  Cardiovascular: Negative for palpitations.  Gastrointestinal: Negative for anorexia.    Neurological: Positive for headaches. Negative for dizziness, tremors, seizures, syncope, facial asymmetry, speech difficulty, weakness and numbness.  All other systems reviewed and are negative.    Physical Exam Updated Vital Signs BP (!) 196/87 (BP Location: Right Arm)   Pulse 66   Temp 98.6 F (37 C) (Oral)   Resp 16   Ht 5\' 3"  (1.6 m)   Wt 83.9 kg (185 lb)   SpO2 100%   BMI 32.77 kg/m   Physical Exam  Constitutional: She is oriented to person, place, and time. She appears well-developed and well-nourished. No distress.  HENT:  Head: Normocephalic and atraumatic.  Mouth/Throat: No oropharyngeal exudate.  Eyes: Conjunctivae and EOM are normal. Pupils are equal, round, and  reactive to light.  No proptosis, intact cognition.    Neck: Normal range of motion. Neck supple.  Cardiovascular: Normal rate, regular rhythm, normal heart sounds and intact distal pulses.  Pulmonary/Chest: Effort normal and breath sounds normal. No stridor. She has no wheezes. She has no rales.  Abdominal: Soft. Bowel sounds are normal. She exhibits no mass. There is no tenderness. There is no rebound and no guarding.  Musculoskeletal: Normal range of motion.  Neurological: She is alert and oriented to person, place, and time. She displays normal reflexes. No cranial nerve deficit or sensory deficit. She exhibits normal muscle tone. Coordination normal. GCS eye subscore is 4. GCS verbal subscore is 5. GCS motor subscore is 6.  Skin: Skin is warm and dry. Capillary refill takes less than 2 seconds.  Psychiatric: She has a normal mood and affect.     ED Treatments / Results  Labs (all labs ordered are listed, but only abnormal results are displayed) Labs Reviewed  CBC WITH DIFFERENTIAL/PLATELET  URINALYSIS, ROUTINE W REFLEX MICROSCOPIC  PROTIME-INR  I-STAT CHEM 8, ED    EKG  EKG Interpretation None       Radiology No results found.  Procedures Procedures (including critical care  time)  Medications Ordered in ED Medications  hydrALAZINE (APRESOLINE) injection 2 mg (not administered)  metoCLOPramide (REGLAN) injection 10 mg (not administered)  diphenhydrAMINE (BENADRYL) injection 12.5 mg (not administered)     Case d/w Dr. Rory Percy of neurology.  Migraine cocktail and if MRI is negative follow up with her headache specialist.    Final Clinical Impressions(s) / ED Diagnoses  Will give augmentin for sphenoid sinusitis  Follow up with your PMD in 2 days for recheck, return for diarrhea intractable pain, inability to pass stool, rigid abdomen, rectal bleeding fevers > 101, intractable vomiting.  Strict return precautions for facial swelling change in thinking or speech, fevers, stiff neck, intractable vomiting, or diarrhea, abdominal pain, Inability to tolerate liquids or food, cough, altered mental status or any concerns. No signs of systemic illness or infection. The patient is nontoxic-appearing on exam and vital signs are within normal limits.    I have reviewed the triage vital signs and the nursing notes. Pertinent labs &imaging results that were available during my care of the patient were reviewed by me and considered in my medical decision making (see chart for details).  After history, exam, and medical workup I feel the patient has been appropriately medically screened and is safe for discharge home. Pertinent diagnoses were discussed with the patient. Patient was given return precautions      Alexy Heldt, MD 05/04/17 [phone]

## 2017-05-04 NOTE — ED Notes (Signed)
Patient transported to MRI 

## 2017-05-04 NOTE — ED Notes (Signed)
Patient in MRI 

## 2017-05-04 NOTE — ED Notes (Signed)
Patient transported to X-ray 

## 2017-05-04 NOTE — ED Triage Notes (Signed)
Pt states that she was seen her last night for the same, headache, hypertension and dizziness. Symptoms have not resolved.

## 2017-05-05 ENCOUNTER — Emergency Department (HOSPITAL_COMMUNITY)
Admission: EM | Admit: 2017-05-05 | Discharge: 2017-05-05 | Disposition: A | Discharge status: Home / Self Care | Payer: Medicare Other | Source: Home / Self Care | Attending: Emergency Medicine | Admitting: Emergency Medicine

## 2017-05-05 DIAGNOSIS — R51 Headache: Secondary | ICD-10-CM

## 2017-05-05 DIAGNOSIS — I10 Essential (primary) hypertension: Secondary | ICD-10-CM

## 2017-05-05 DIAGNOSIS — R519 Headache, unspecified: Secondary | ICD-10-CM

## 2017-05-05 DIAGNOSIS — I160 Hypertensive urgency: Secondary | ICD-10-CM

## 2017-05-05 MED ORDER — DIPHENHYDRAMINE HCL 50 MG/ML IJ SOLN
INTRAMUSCULAR | Status: AC
  Filled 2017-05-05: qty 1

## 2017-05-05 MED ORDER — DIPHENHYDRAMINE HCL 50 MG/ML IJ SOLN
25.0000 mg | Freq: Once | INTRAMUSCULAR | Status: AC
  Administered 2017-05-05: 25 mg via INTRAVENOUS
  Filled 2017-05-05: qty 1

## 2017-05-05 MED ORDER — KETOROLAC TROMETHAMINE 60 MG/2ML IM SOLN
INTRAMUSCULAR | Status: AC
  Filled 2017-05-05: qty 2

## 2017-05-05 MED ORDER — KETOROLAC TROMETHAMINE 30 MG/ML IJ SOLN
15.0000 mg | Freq: Once | INTRAMUSCULAR | Status: AC
  Administered 2017-05-05: 15 mg via INTRAVENOUS
  Filled 2017-05-05: qty 1

## 2017-05-05 MED ORDER — PROCHLORPERAZINE EDISYLATE 5 MG/ML IJ SOLN
INTRAMUSCULAR | Status: AC
  Filled 2017-05-05: qty 2

## 2017-05-05 MED ORDER — PROCHLORPERAZINE EDISYLATE 5 MG/ML IJ SOLN
10.0000 mg | Freq: Once | INTRAMUSCULAR | Status: AC
  Administered 2017-05-05: 10 mg via INTRAVENOUS
  Filled 2017-05-05: qty 2

## 2017-05-05 NOTE — Discharge Instructions (Signed)
You  can take 10 mg of Norvasc a day You can also take 25 mg of your HCTZ Please see her primary doctor in 1 week for recheck your blood pressure  You are having a headache. No specific cause was found today for your headache. It may have been a migraine or other cause of headache. Stress, anxiety, fatigue, and depression are common triggers for headaches. Your headache today does not appear to be life-threatening or require hospitalization, but often the exact cause of headaches is not determined in the emergency department. Therefore, follow-up with your doctor is very important to find out what may have caused your headache, and whether or not you need any further diagnostic testing or treatment. Sometimes headaches can appear benign (not harmful), but then more serious symptoms can develop which should prompt an immediate re-evaluation by your doctor or the emergency department.  SEEK MEDICAL ATTENTION IF:  You develop possible problems with medications prescribed.  The medications don't resolve your headache, if it recurs , or if you have multiple episodes of vomiting or can't take fluids. You have a change from the usual headache.  RETURN IMMEDIATELY IF you develop a sudden, severe headache or confusion, become poorly responsive or faint, develop a fever above 100.76F or problem breathing, have a change in speech, vision, swallowing, or understanding, or develop new weakness, numbness, tingling, incoordination, or have a seizure.

## 2017-05-05 NOTE — ED Notes (Signed)
Pt endorses a headache and HTN since Tuesday. PT reports being seen on Tuesday and the treatment provided didn't help.

## 2017-05-05 NOTE — ED Provider Notes (Signed)
[facility] EMERGENCY DEPARTMENT Provider Note   CSN: [id number] Arrival date & time: 05/04/17  1954     History   Chief Complaint Chief Complaint  Patient presents with  . Dizziness    HPI Jeanette [name] is a 66 y.o. female.  The history is provided by the patient.  Dizziness  Quality:  Lightheadedness Severity:  Moderate Onset quality:  Gradual Timing:  Constant Progression:  Worsening Chronicity:  New Relieved by:  Nothing Worsened by:  Nothing Associated symptoms: headaches   Associated symptoms: no chest pain and no weakness   Risk factors: hx of stroke   66 year old female with a history of hypertension, previous stroke Presents with ongoing episodes of dizziness, lightheadedness, headache, hypertension She reports these episodes been ongoing for over 2 days She was seen in the ER recently for this condition was sent home but reports she has not improved She reports the headache was gradual in onset No focal weakness She is very concerned about her elevated blood pressure Past Medical History:  Diagnosis Date  . Anemia   . Arthritis    "back" (07/18/2014)  . Cancer of left breast ([place])   . Chronic back pain   . Headache   . Heart murmur    many years ago  . Hyperlipidemia   . Hypertension   . Hypothyroidism    thyroidectomy 07/18/2014  . Neuromuscular disorder (HCC)    neuropathy hands and feet  . Pneumonia ~ 2010  . Stroke ([place])   . Type II diabetes mellitus [facility])     Patient Active Problem List   Diagnosis Date Noted  . Migraine 03/19/2015  . Abnormal MRI of the head 03/19/2015  . CKD stage 3 due to type 2 diabetes mellitus ([place]) 02/24/2015  . Chronic diastolic CHF (congestive heart failure) ([place]) 02/24/2015  . Acute CVA (cerebrovascular accident) ([place])   . 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 Multiple thyroid nodules 07/18/2014  . HLD (hyperlipidemia)  03/26/2014  . Breast cancer of upper-outer quadrant of left female breast ([place]) 03/22/2014  . 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 Diabetes ([place]) 02/05/2014  . Thyroid nodule 02/05/2014  . H/O malignant neoplasm of breast 02/05/2014  . Essential (primary) hypertension 02/05/2014    Past Surgical History:  Procedure Laterality Date  . ABDOMINAL HYSTERECTOMY  1970's  . APPENDECTOMY    . BILATERAL OOPHORECTOMY  1980j's  . BREAST BIOPSY Left 2008  . BUNIONECTOMY Right   . CARDIAC CATHETERIZATION  02/16/2011   NL coronares, EF 65%, no AS or MR (Dr. Corliss Parish; [place])  . CARPAL TUNNEL RELEASE Bilateral   . MASTECTOMY Left 2008  . REDUCTION MAMMAPLASTY Right   . TEE WITHOUT CARDIOVERSION N/A 02/25/2015   Procedure: TRANSESOPHAGEAL ECHOCARDIOGRAM (TEE);  Surgeon: Sueanne Margarita, MD;  Location: [facility] ENDOSCOPY;  Service: Cardiovascular;  Laterality: N/A;  . THYROIDECTOMY N/A 07/18/2014   Procedure: TOTAL THYROIDECTOMY;  Surgeon: Armandina Gemma, MD;  Location: [facility];  Service: General;  Laterality: N/A;  . TONSILLECTOMY    . TOTAL THYROIDECTOMY  07/18/2014    OB History    No data available       Home Medications    Prior to Admission medications   Medication Sig Start Date End Date Taking? Authorizing Provider  amLODipine (NORVASC) 5 MG tablet Take 5 mg by mouth daily. 04/20/17  Yes [provider]  amoxicillin-clavulanate (AUGMENTIN) 875-125 MG tablet Take 1  tablet by mouth 2 (two) times daily. One po bid x 7 days Patient taking differently: Take 1 tablet by mouth 2 (two) times daily. FOR 7 DAYS 05/04/17  Yes Palumbo, April, MD  aspirin EC 81 MG tablet Take 81 mg by mouth daily.   Yes [provider]  atorvastatin (LIPITOR) 20 MG tablet Take 20 mg by mouth every morning.  02/16/11  Yes [provider]  calcium-vitamin D (OSCAL WITH D) 500-200 MG-UNIT per tablet Take 2 tablets by mouth 2 (two) times daily. Patient taking differently: Take 1-2  tablets by mouth daily.  07/19/14  Yes Armandina Gemma, MD  clopidogrel (PLAVIX) 75 MG tablet Take 1 tablet (75 mg total) by mouth daily. 04/02/15  Yes Garvin Fila, MD  diclofenac sodium (VOLTAREN) 1 % GEL Apply 2 g topically 4 (four) times daily. Patient taking differently: Apply 2 g topically 4 (four) times daily as needed (to affected sites for pain).  10/15/16  Yes Ward, Ozella Almond, PA-C  divalproex (DEPAKOTE ER) 500 MG 24 hr tablet Take 1 tablet (500 mg total) by mouth 2 (two) times daily. 05/04/17  Yes Garvin Fila, MD  gabapentin (NEURONTIN) 300 MG capsule Take 300 mg by mouth 4 (four) times daily.    Yes [provider]  hydrochlorothiazide (HYDRODIURIL) 12.5 MG tablet Take 12.5 mg by mouth daily. 02/07/15  Yes [provider]  levothyroxine (SYNTHROID, LEVOTHROID) 88 MCG tablet Take 88 mcg by mouth daily before breakfast.   Yes [provider]  meloxicam (MOBIC) 15 MG tablet Take 15 mg by mouth daily. 04/18/17  Yes [provider]  metFORMIN (GLUCOPHAGE-XR) 500 MG 24 hr tablet Take 500 mg by mouth 2 (two) times daily. 03/02/17  Yes [provider]  Multiple Vitamins-Minerals (MULTIVITAMIN WITH MINERALS) tablet Take 1 tablet by mouth daily.  02/16/11  Yes [provider]  SYNTHROID 88 MCG tablet Take 1 tablet (88 mcg total) by mouth daily before breakfast. 07/19/14  Yes Armandina Gemma, MD  tiZANidine (ZANAFLEX) 4 MG tablet Take 4 mg by mouth every 8 (eight) hours as needed for muscle spasms.  04/18/17  Yes [provider]  traMADol (ULTRAM) 50 MG tablet Take 1 tablet (50 mg total) by mouth daily as needed for moderate pain. 02/25/15  Yes Ghimire, Henreitta Leber, MD  ACCU-CHEK AVIVA PLUS test strip  02/27/15   [provider]    Family History Family History  Problem Relation Age of Onset  . Hypertension Father   . Diabetes Father   . Hypertension Sister   . Diabetes Sister     Social History Social History   Tobacco  Use  . Smoking status: Former Smoker    Packs/day: 0.25    Years: 5.00    Pack years: 1.25    Types: Cigarettes    Last attempt to quit: 05/24/1970    Years since quitting: 46.9  . Smokeless tobacco: Never Used  Substance Use Topics  . Alcohol use: No    Comment: 07/18/2014 "quit drinking in the 1980's"  . Drug use: Yes    Types: Marijuana    Comment: occassionally way back when     Allergies   Topamax [topiramate]   Review of Systems Review of Systems  Constitutional: Negative for fever.  Eyes: Negative for visual disturbance.  Cardiovascular: Negative for chest pain.  Neurological: Positive for dizziness and headaches. Negative for weakness.  All other systems reviewed and are negative.    Physical Exam Updated Vital Signs  BP (!) 185/92   Pulse 72   Temp 97.6 F (36.4 C)   Resp 18   SpO2 100%   Physical Exam  CONSTITUTIONAL: Well developed/well nourished HEAD: Normocephalic/atraumatic EYES: EOMI/PERRL, no nystagmus,  no ptosis ENMT: Mucous membranes moist NECK: supple no meningeal signs, no bruits CV: S1/S2 noted, no murmurs/rubs/gallops noted LUNGS: Lungs are clear to auscultation bilaterally, no apparent distress ABDOMEN: soft, nontender, no rebound or guarding GU:no cva tenderness NEURO:Awake/alert, face symmetric, no arm or leg drift is noted Equal 5/5 strength with shoulder abduction, elbow flex/extension, wrist flex/extension in upper extremities and equal hand grips bilaterally Equal 5/5 strength with hip flexion,knee flex/extension, foot dorsi/plantar flexion Cranial nerves 3/4/5/6/11/29/08/11/12 tested and intact No past pointing Sensation to light touch intact in all extremities EXTREMITIES: pulses normal, full ROM SKIN: warm, color normal PSYCH: no abnormalities of mood noted  ED Treatments / Results  Labs (all labs ordered are listed, but only abnormal results are displayed) Labs Reviewed  BASIC METABOLIC PANEL - Abnormal; Notable for the  following components:      Result Value   Glucose, Bld 127 (*)    Creatinine, Ser 1.19 (*)    GFR calc non Af Amer 47 (*)    GFR calc Af Amer 54 (*)    All other components within normal limits  URINALYSIS, ROUTINE W REFLEX MICROSCOPIC - Abnormal; Notable for the following components:   Color, Urine STRAW (*)    Specific Gravity, Urine 1.002 (*)    All other components within normal limits  CBC    EKG  EKG Interpretation  Date/Time:  Wednesday May 04 2017 20:06:29 EST Ventricular Rate:  77 PR Interval:  176 QRS Duration: 66 QT Interval:  390 QTC Calculation: 441 R Axis:   36 Text Interpretation:  Normal sinus rhythm Cannot rule out Anterior infarct , age undetermined Abnormal ECG When compared with ECG of 05/16/2016, No significant change was found Confirmed by Delora Fuel ([id number]) on 05/04/2017 11:15:44 PM       Radiology Dg Chest 2 View  Result Date: 05/04/2017 CLINICAL DATA:  Acute onset of headache. High blood pressure. Generalized weakness. EXAM: CHEST  2 VIEW COMPARISON:  Chest radiograph performed 02/22/2015 FINDINGS: The lungs are well-aerated and clear. There is no evidence of focal opacification, pleural effusion or pneumothorax. The heart is normal in size; the mediastinal contour is within normal limits. No acute osseous abnormalities are seen. Clips are noted about the left axilla. IMPRESSION: No acute cardiopulmonary process seen. Electronically Signed   By: Garald Balding M.D.   On: 05/04/2017 01:52   Ct Head Wo Contrast  Result Date: 05/04/2017 CLINICAL DATA:  Acute onset of headache.  High blood pressure. EXAM: CT HEAD WITHOUT CONTRAST TECHNIQUE: Contiguous axial images were obtained from the base of the skull through the vertex without intravenous contrast. COMPARISON:  CT of the head performed 02/22/2015, and MRI of the brain performed 01/21/2016 FINDINGS: Brain: No evidence of acute infarction, hemorrhage, hydrocephalus, extra-axial collection or mass  lesion/mass effect. The posterior fossa, including the cerebellum, brainstem and fourth ventricle, is within normal limits. The third and lateral ventricles, and basal ganglia are unremarkable in appearance. The cerebral hemispheres are symmetric in appearance, with normal gray-white differentiation. No mass effect or midline shift is seen. Vascular: No hyperdense vessel or unexpected calcification. Skull: There is no evidence of fracture; visualized osseous structures are unremarkable in appearance. Sinuses/Orbits: The orbits are within normal limits. The paranasal sinuses and mastoid air cells are well-aerated. Other:  No significant soft tissue abnormalities are seen. IMPRESSION: Unremarkable noncontrast CT of the head. Electronically Signed   By: Garald Balding M.D.   On: 05/04/2017 01:52   Mr Brain Wo Contrast  Result Date: 05/04/2017 CLINICAL DATA:  Initial evaluation for acute dizziness, headache. EXAM: MRI HEAD WITHOUT CONTRAST TECHNIQUE: Multiplanar, multiecho pulse sequences of the brain and surrounding structures were obtained without intravenous contrast. COMPARISON:  [facility] CT from earlier the same day. FINDINGS: Brain: Cerebral volume within normal limits for patient age. No focal parenchymal signal abnormality identified. No abnormal foci of restricted diffusion to suggest acute or subacute ischemia. Gray-white matter differentiation well maintained. No encephalomalacia to suggest chronic infarction. No foci of susceptibility artifact to suggest acute or chronic intracranial hemorrhage. No mass lesion, midline shift or mass effect. No hydrocephalus. No extra-axial fluid collection. Major dural sinuses are grossly patent. Pituitary gland and suprasellar region are normal. Midline structures intact and normal. Vascular: Major intracranial vascular flow voids well maintained and normal in appearance. Skull and upper cervical spine: Craniocervical junction normal. Visualized upper cervical spine  within normal limits. Bone marrow signal intensity normal. No scalp soft tissue abnormality. Sinuses/Orbits: Globes and orbital soft tissues within normal limits. Mild left sphenoid sinus disease. Paranasal sinuses otherwise clear. No mastoid effusion. Inner ear structures normal. Other: None. IMPRESSION: 1. Normal brain MRI.  No acute intracranial abnormality. 2. Mild left sphenoid sinusitis. Electronically Signed   By: Jeannine Boga M.D.   On: 05/04/2017 04:04    Procedures Procedures (including critical care time)  Medications Ordered in ED Medications  prochlorperazine (COMPAZINE) injection 10 mg (10 mg Intravenous Given 05/05/17 0238)  ketorolac (TORADOL) 30 MG/ML injection 15 mg (15 mg Intravenous Given 05/05/17 0238)  diphenhydrAMINE (BENADRYL) injection 25 mg (25 mg Intravenous Given 05/05/17 0238)     Initial Impression / Assessment and Plan / ED Course  I have reviewed the triage vital signs and the nursing notes.  Pertinent labs & imaging results that were available during my care of the patient were reviewed by me and considered in my medical decision making (see chart for details).     Recent ED visit for similar symptoms, had brain MRI that was normal Patient reports ongoing symptoms that are not improving We started out with another migraine cocktail No  signs of stroke at this time 4:57 AM Patient is improved. She is awake and alert, smiling, she is ambulatory Taking p.o. without any difficulty  She would like to be discharged  Plan to increase her Norvasc to 10 mg a day She can also increase her HCTZ to 25 mg a day PCP follow-up in 1 week Final Clinical Impressions(s) / ED Diagnoses   Final diagnoses:  Essential hypertension  Acute nonintractable headache, unspecified headache type  Hypertensive urgency    ED Discharge Orders    None       Ripley Fraise, MD 05/05/17 [phone]

## 2017-05-05 NOTE — ED Notes (Signed)
Patient was able to tolerate PO food and drink at this time. Patient was also able to ambulate as well.

## 2017-05-09 ENCOUNTER — Telehealth: Payer: Self-pay | Admitting: Internal Medicine

## 2017-05-27 DIAGNOSIS — T464X5A Adverse effect of angiotensin-converting-enzyme inhibitors, initial encounter: Secondary | ICD-10-CM | POA: Insufficient documentation

## 2017-06-14 ENCOUNTER — Telehealth: Payer: Self-pay | Admitting: Internal Medicine

## 2017-06-14 ENCOUNTER — Encounter: Payer: Self-pay | Admitting: Gastroenterology

## 2017-06-14 NOTE — Telephone Encounter (Signed)
Rec'd from Dr. Chiquita Loth forwarded 3 pages to Dr.Gessner Glendell Docker

## 2017-07-05 ENCOUNTER — Other Ambulatory Visit: Payer: Self-pay

## 2017-07-05 ENCOUNTER — Ambulatory Visit: Payer: Medicare Other | Attending: Plastic Surgery | Admitting: Physical Therapy

## 2017-07-05 ENCOUNTER — Encounter: Payer: Self-pay | Admitting: Physical Therapy

## 2017-07-05 DIAGNOSIS — M6281 Muscle weakness (generalized): Secondary | ICD-10-CM | POA: Insufficient documentation

## 2017-07-05 DIAGNOSIS — M25612 Stiffness of left shoulder, not elsewhere classified: Secondary | ICD-10-CM | POA: Insufficient documentation

## 2017-07-05 DIAGNOSIS — R293 Abnormal posture: Secondary | ICD-10-CM | POA: Diagnosis present

## 2017-07-05 DIAGNOSIS — I972 Postmastectomy lymphedema syndrome: Secondary | ICD-10-CM | POA: Diagnosis present

## 2017-07-05 DIAGNOSIS — M79602 Pain in left arm: Secondary | ICD-10-CM | POA: Diagnosis present

## 2017-07-05 NOTE — Therapy (Signed)
[place], [place], [address] Phone: [phone]   Fax:  [phone]  Physical Therapy Evaluation  Patient Details  Name: Jeanette [name] MRN: [id number] Date of Birth: 05/10/1951 Referring Provider: Armond Hang   Encounter Date: 07/05/2017  PT End of Session - 07/05/17 1704    Visit Number  1    Number of Visits  25    Date for PT Re-Evaluation  09/02/17    PT Start Time  1350    PT Stop Time  1430    PT Time Calculation (min)  40 min    Activity Tolerance  Patient tolerated treatment well    Behavior During Therapy  [facility] for tasks assessed/performed       Past Medical History:  Diagnosis Date  . Anemia   . Arthritis    "back" (07/18/2014)  . Cancer of left breast ([place])   . Chronic back pain   . Headache   . Heart murmur    many years ago  . Hyperlipidemia   . Hypertension   . Hypothyroidism    thyroidectomy 07/18/2014  . Neuromuscular disorder (HCC)    neuropathy hands and feet  . Pneumonia ~ 2010  . Stroke ([place])   . Type II diabetes mellitus ([place])     Past Surgical History:  Procedure Laterality Date  . ABDOMINAL HYSTERECTOMY  1970's  . APPENDECTOMY    . BILATERAL OOPHORECTOMY  1980j's  . BREAST BIOPSY Left 2008  . BUNIONECTOMY Right   . CARDIAC CATHETERIZATION  02/16/2011   NL coronares, EF 65%, no AS or MR (Dr. Corliss Parish; [place])  . CARPAL TUNNEL RELEASE Bilateral   . MASTECTOMY Left 2008  . REDUCTION MAMMAPLASTY Right   . TEE WITHOUT CARDIOVERSION N/A 02/25/2015   Procedure: TRANSESOPHAGEAL ECHOCARDIOGRAM (TEE);  Surgeon: Sueanne Margarita, MD;  Location: [facility] ENDOSCOPY;  Service: Cardiovascular;  Laterality: N/A;  . THYROIDECTOMY N/A 07/18/2014   Procedure: TOTAL THYROIDECTOMY;  Surgeon: Armandina Gemma, MD;  Location: [facility];  Service: General;  Laterality: N/A;  . TONSILLECTOMY    . TOTAL THYROIDECTOMY  07/18/2014    There were no vitals filed for this  visit.   Subjective Assessment - 07/05/17 1401    Subjective  left hand and arm swelling that started jan 1 with no known cause     Pertinent History  left breast mastectomy with one positive node, not sure how many were taken Dec. 17 2008 in [place] with 4 doses of chemotherapy and 36 doses  of radiation  Past history includes back osteoarthriits, tonsillecty, thyroidectomy, hysterectomy , oophorectomy and bunionectomy on right foo. She had a stroke about 2 years ago, an occasionally has memory problems  She has limited range of motion on left shoulder and occasional neck and shoulder pain     Limitations  Lifting;House hold activities    Patient Stated Goals  get rid of the swelling     Currently in Pain?  Yes    Pain Score  7     Pain Location  Hand    Pain Orientation  Left    Pain Descriptors / Indicators  Aching    Pain Radiating Towards  up the arm and into the shoulder     Pain Onset  More than a month ago    Pain Frequency  Constant    Aggravating Factors   activity     Pain Relieving Factors  not using it.  [facility] PT Assessment - 07/05/17 0001      Assessment   Medical Diagnosis  left breast cancer     Referring Provider  Dr.Tammy Luciana Axe    Onset Date/Surgical Date  05/10/07    Hand Dominance  Right      Precautions   Precautions  None hasn't been lifting more than 10 pounds       Restrictions   Weight Bearing Restrictions  No      Balance Screen   Has the patient fallen in the past 6 months  No    Has the patient had a decrease in activity level because of a fear of falling?   No    Is the patient reluctant to leave their home because of a fear of falling?   No      Home Environment   Living Environment  Private residence    Living Arrangements  Alone    Available Help at Discharge  Available PRN/intermittently      Prior Function   Level of Independence  Independent    Leisure  goes to gym 3-4 times a week for water aerobics        Cognition   Overall Cognitive Status  Within Functional Limits for tasks assessed      Observation/Other Assessments   Observations  Pt has well healed mastectomy incision     Quick DASH   72.73      Observation/Other Assessments-Edema    Edema  Circumferential      Sensation   Light Touch  Not tested      Coordination   Gross Motor Movements are Fluid and Coordinated  No pt winces when putting shirt back on       Posture/Postural Control   Posture/Postural Control  Postural limitations    Postural Limitations  Rounded Shoulders;Forward head;Decreased thoracic kyphosis      AROM   Right Shoulder Flexion  160 Degrees    Right Shoulder ABduction  155 Degrees    Left Shoulder Flexion  115 Degrees    Left Shoulder ABduction  85 Degrees      Strength   Overall Strength Comments  limited in left shoulder by weakness and pain     Right/Left Shoulder  Right;Left    Right Shoulder Flexion  4/5    Right Shoulder ABduction  4/5    Left Shoulder Flexion  3-/5    Left Shoulder ABduction  3-/5      Palpation   Palpation comment  tight scar tissue at anterior chest with visible signs of radiation scarring         LYMPHEDEMA/ONCOLOGY QUESTIONNAIRE - 07/05/17 1421      Type   Cancer Type  breast cancer       Surgeries   Mastectomy Date  05/10/07      Treatment   Past Chemotherapy Treatment  Yes    Past Radiation Treatment  Yes      What other symptoms do you have   Are you Having Heaviness or Tightness  Yes    Are you having Pain  Yes    Are you having pitting edema  Yes    Body Site  left hand and forearm     Is it Hard or Difficult finding clothes that fit  Yes    Do you have infections  No    Is there Decreased scar mobility  Yes    Stemmer Sign  Yes      Lymphedema  Stage   Stage  STAGE 1 SPONTANEOUSLY REVERSIBLE a little better in the morning but still there       Lymphedema Assessments   Lymphedema Assessments  Upper extremities      Right Upper Extremity  Lymphedema   15 cm Proximal to Olecranon Process  36 cm    10 cm Proximal to Olecranon Process  34 cm    Olecranon Process  27.5 cm    15 cm Proximal to Ulnar Styloid Process  27.5 cm    10 cm Proximal to Ulnar Styloid Process  23.7 cm    Just Proximal to Ulnar Styloid Process  16.6 cm    Across Hand at [employer]  20.5 cm    At [place] of 2nd Digit  7 cm      Left Upper Extremity Lymphedema   15 cm Proximal to Olecranon Process  36 cm    10 cm Proximal to Olecranon Process  34 cm    Olecranon Process  28 cm    15 cm Proximal to Ulnar Styloid Process  29 cm    10 cm Proximal to Ulnar Styloid Process  26 cm    Just Proximal to Ulnar Styloid Process  18.3 cm    Across Hand at [employer]  21.5 cm    At [place] of 2nd Digit  7.5 cm        Quick Dash - 07/05/17 0001    Open a tight or new jar  Severe difficulty    Do heavy household chores (wash walls, wash floors)  Moderate difficulty    Carry a shopping bag or briefcase  Severe difficulty    Wash your back  Severe difficulty    Use a knife to cut food  Severe difficulty    Recreational activities in which you take some force or impact through your arm, shoulder, or hand (golf, hammering, tennis)  Severe difficulty    During the past week, to what extent has your arm, shoulder or hand problem interfered with your normal social activities with family, friends, neighbors, or groups?  Quite a bit    During the past week, to what extent has your arm, shoulder or hand problem limited your work or other regular daily activities  Quite a bit    Arm, shoulder, or hand pain.  Severe    Tingling (pins and needles) in your arm, shoulder, or hand  Severe    Difficulty Sleeping  Severe difficulty    DASH Score  72.73 %       Objective measurements completed on examination: See above findings.              PT Education - 07/05/17 1703    Education provided  Yes    Education Details  details of complete decongestive therapy, pt  wants to proceed with bandaging     Person(s) Educated  Patient    Methods  Explanation    Comprehension  Verbalized understanding       PT Short Term Goals - 07/05/17 1716      PT SHORT TERM GOAL #1   Title  Pt will verbalize lymphedema risk reduction practices     Time  4    Period  Weeks    Status  New      PT SHORT TERM GOAL #2   Title  Pt will have reduction of left forearm at 10 cm proxim to ulna by 1.5 cm ( to 24.5 cm)  Baseline  26 cm    Time  4    Period  Weeks    Status  New      PT SHORT TERM GOAL #3   Title  Pt will be independent in a beginning home program for UE ROM     Time  4    Period  Weeks    Status  New        PT Long Term Goals - 07/05/17 1718      PT LONG TERM GOAL #1   Title  Pt will know how to obtain day and/or nighttime compression garments, do self manual lymph drainage and exercise  so that she can manage her lymphedema at home     Time  8    Period  Weeks    Status  New      PT LONG TERM GOAL #2   Title  Pt will increase painfree left shoulder abduction to 125 degrees so that she can easily put on her shirt without pain     Baseline  85 degrees and painful     Time  8    Period  Weeks    Status  New      PT LONG TERM GOAL #3   Title  Pt will decrease Quick DASH score to < 50 indicating and improvement in function of left arm     Baseline  72.73    Time  8    Period  Weeks    Status  New             Plan - 07/05/17 1706    Clinical Impression Statement  67 yo female 10 years post breast cancer with mastecomy, lymph node removal and radiation who is developing lymphedema in her left arm .  She has pitting lymphedema more distal but is progressin upward.  She alos has pain, weakness and stiffness in her left shoulder.  She is a Medicare patient and has limited funds for garments.  Will need to contact [employer] to see if they can offer assist.  She has pitting edema that shoulder respond to bandaging; pt wants to proceed with  complete decongestive therapy     History and Personal Factors relevant to plan of care:  lives alone, has a distance to drive to our clinic, previous surgery, chemo and radiation    Clinical Presentation  Evolving    Clinical Presentation due to:  progressing lymphedema     Clinical Decision Making  Moderate    Rehab Potential  Good    Clinical Impairments Affecting Rehab Potential  previous radiation and lymph node removal     PT Frequency  3x / week    PT Duration  8 weeks    PT Treatment/Interventions  ADLs/Self Care Home Management;DME Instruction;Scar mobilization;Passive range of motion;Therapeutic exercise;Therapeutic activities;Orthotic Fit/Training;Patient/family education;Manual techniques;Manual lymph drainage;Compression bandaging;Taping    PT Next Visit Plan  manual lymph drainage, compression bandaging and remedial exercises, educate and sign up for ABC class, later incorporate shoulder ROM, stregthening. postural exercises and manual tecnhniques for shoulder stretching     Consulted and Agree with Plan of Care  Patient       Patient will benefit from skilled therapeutic intervention in order to improve the following deficits and impairments:  Decreased knowledge of use of DME, Increased fascial restricitons, Pain, Postural dysfunction, Decreased scar mobility, Decreased range of motion, Decreased strength, Impaired perceived functional ability, Impaired UE functional use, Increased edema, Decreased knowledge of  precautions  Visit Diagnosis: Postmastectomy lymphedema  Stiffness of left shoulder joint  Pain in left arm  Abnormal posture  Muscle weakness (generalized)     Problem List Patient Active Problem List   Diagnosis Date Noted  . Migraine 03/19/2015  . Abnormal MRI of the head 03/19/2015  . CKD stage 3 due to type 2 diabetes mellitus ([place]) 02/24/2015  . Chronic diastolic CHF (congestive heart failure) ([place]) 02/24/2015  . Acute CVA (cerebrovascular accident)  ([place])   . 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 Multiple thyroid nodules 07/18/2014  . HLD (hyperlipidemia) 03/26/2014  . Breast cancer of upper-outer quadrant of left female breast ([place]) 03/22/2014  . 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 Diabetes ([place]) 02/05/2014  . Thyroid nodule 02/05/2014  . H/O malignant neoplasm of breast 02/05/2014  . Essential (primary) hypertension 02/05/2014   Donato Heinz. Owens Shark PT  [place] 07/05/2017, [place], [place], [address] Phone: [phone]   Fax:  [phone]  Name: Jeanette [name] MRN: [id number] Date of Birth: 1951/02/04

## 2017-07-08 ENCOUNTER — Ambulatory Visit: Payer: Medicare Other | Admitting: Physical Therapy

## 2017-07-08 DIAGNOSIS — M79602 Pain in left arm: Secondary | ICD-10-CM

## 2017-07-08 DIAGNOSIS — M25612 Stiffness of left shoulder, not elsewhere classified: Secondary | ICD-10-CM

## 2017-07-08 DIAGNOSIS — I972 Postmastectomy lymphedema syndrome: Secondary | ICD-10-CM

## 2017-07-08 NOTE — Therapy (Signed)
[place], [place], [address] Phone: [phone]   Fax:  [phone]  Physical Therapy Treatment  Patient Details  Name: Jeanette [name] MRN: [id number] Date of Birth: [birth year] Referring Provider: Armond Hang   Encounter Date: 07/08/2017  PT End of Session - 07/08/17 1158    Visit Number  2    Number of Visits  25    Date for PT Re-Evaluation  09/02/17    PT Start Time  1107    PT Stop Time  1153    PT Time Calculation (min)  46 min    Activity Tolerance  Patient tolerated treatment well    Behavior During Therapy  [facility] for tasks assessed/performed       Past Medical History:  Diagnosis Date  . Anemia   . Arthritis    "back" (07/18/2014)  . Cancer of left breast ([place])   . Chronic back pain   . Headache   . Heart murmur    many years ago  . Hyperlipidemia   . Hypertension   . Hypothyroidism    thyroidectomy 07/18/2014  . Neuromuscular disorder (HCC)    neuropathy hands and feet  . Pneumonia ~ 2010  . Stroke ([place])   . Type II diabetes mellitus ([place])     Past Surgical History:  Procedure Laterality Date  . ABDOMINAL HYSTERECTOMY  1970's  . APPENDECTOMY    . BILATERAL OOPHORECTOMY  1980j's  . BREAST BIOPSY Left 2008  . BUNIONECTOMY Right   . CARDIAC CATHETERIZATION  02/16/2011   NL coronares, EF 65%, no AS or MR (Dr. Corliss Parish; [place])  . CARPAL TUNNEL RELEASE Bilateral   . MASTECTOMY Left 2008  . REDUCTION MAMMAPLASTY Right   . TEE WITHOUT CARDIOVERSION N/A 02/25/2015   Procedure: TRANSESOPHAGEAL ECHOCARDIOGRAM (TEE);  Surgeon: Sueanne Margarita, MD;  Location: [facility] ENDOSCOPY;  Service: Cardiovascular;  Laterality: N/A;  . THYROIDECTOMY N/A 07/18/2014   Procedure: TOTAL THYROIDECTOMY;  Surgeon: Armandina Gemma, MD;  Location: [facility];  Service: General;  Laterality: N/A;  . TONSILLECTOMY    . TOTAL THYROIDECTOMY  07/18/2014    There were no vitals filed for this  visit.  Subjective Assessment - 07/08/17 1110    Subjective  Nothing changed.    Pertinent History  left breast mastectomy with one positive node, not sure how many were taken Dec. 17 2008 in [place] with 4 doses of chemotherapy and 36 doses  of radiation  Past history includes back osteoarthriits, tonsillecty, thyroidectomy, hysterectomy , oophorectomy and bunionectomy on right foo. She had a stroke about 2 years ago, an occasionally has memory problems  She has limited range of motion on left shoulder and occasional neck and shoulder pain     Currently in Pain?  Yes    Pain Score  6     Pain Location  Hand and shoulder    Pain Orientation  Left    Pain Descriptors / Indicators  Aching    Aggravating Factors   activity    Pain Relieving Factors  rest                      [facility] Adult PT Treatment/Exercise - 07/08/17 0001      Manual Therapy   Manual Therapy  Edema management;Manual Lymphatic Drainage (MLD);Compression Bandaging    Manual Lymphatic Drainage (MLD)  In supine: short neck, superficial and deep abdomen (including instructing patient in diaphragmatic breathing),  right axilla and anterior interaxillary anastomosis, left groin and axillo-inguinal anastomosis, and left UE from proximal to distal, then retracing steps.    Compression Bandaging  Thin stockinette, Elastomull x 1 on all fingers, Artiflex x 2, 1-6 cm., 1-10 cm., and 1-12 cm. short stretch bandage from hand to axilla.             PT Education - 07/08/17 1158    Education provided  Yes    Education Details  left UE AROM to be done with bandages on; to remove bandages in case of unrelenting pain or paresthesias; to remove bandages if we do not see her by Tuesday    Person(s) Educated  Patient    Methods  Explanation;Handout    Comprehension  Verbalized understanding       PT Short Term Goals - 07/05/17 1716      PT SHORT TERM GOAL #1   Title  Pt will verbalize lymphedema risk  reduction practices     Time  4    Period  Weeks    Status  New      PT SHORT TERM GOAL #2   Title  Pt will have reduction of left forearm at 10 cm proxim to ulna by 1.5 cm ( to 24.5 cm)     Baseline  26 cm    Time  4    Period  Weeks    Status  New      PT SHORT TERM GOAL #3   Title  Pt will be independent in a beginning home program for UE ROM     Time  4    Period  Weeks    Status  New        PT Long Term Goals - 07/05/17 1718      PT LONG TERM GOAL #1   Title  Pt will know how to obtain day and/or nighttime compression garments, do self manual lymph drainage and exercise  so that she can manage her lymphedema at home     Time  8    Period  Weeks    Status  New      PT LONG TERM GOAL #2   Title  Pt will increase painfree left shoulder abduction to 125 degrees so that she can easily put on her shirt without pain     Baseline  85 degrees and painful     Time  8    Period  Weeks    Status  New      PT LONG TERM GOAL #3   Title  Pt will decrease Quick DASH score to < 50 indicating and improvement in function of left arm     Baseline  72.73    Time  8    Period  Weeks    Status  New            Plan - 07/08/17 1159    Clinical Impression Statement  Pt. did very well with initial session of complete decongestive therapy and with instruction about self-care with bandages on.  Initial follow-up may be complicated by the fact that her daughter-in-law will be induced to give birth and pt. will have her other children to take care of when that happens on Wednesday, but the secretary is working to try to get her scheduled around this.    Rehab Potential  Good    Clinical Impairments Affecting Rehab Potential  previous radiation and lymph node removal  PT Frequency  3x / week    PT Duration  8 weeks    PT Treatment/Interventions  ADLs/Self Care Home Management;DME Instruction;Scar mobilization;Passive range of motion;Therapeutic exercise;Therapeutic activities;Orthotic  Fit/Training;Patient/family education;Manual techniques;Manual lymph drainage;Compression bandaging;Taping    PT Next Visit Plan  manual lymph drainage, compression bandaging and remedial exercises, educate and sign up for ABC class, later incorporate shoulder ROM, strengthening, postural exercises and manual tecnhniques for shoulder stretching     PT Home Exercise Plan  left UE AROM, neck stretches and breathing    Consulted and Agree with Plan of Care  Patient       Patient will benefit from skilled therapeutic intervention in order to improve the following deficits and impairments:  Decreased knowledge of use of DME, Increased fascial restricitons, Pain, Postural dysfunction, Decreased scar mobility, Decreased range of motion, Decreased strength, Impaired perceived functional ability, Impaired UE functional use, Increased edema, Decreased knowledge of precautions  Visit Diagnosis: Postmastectomy lymphedema  Stiffness of left shoulder joint  Pain in left arm     Problem List Patient Active Problem List   Diagnosis Date Noted  . Migraine 03/19/2015  . Abnormal MRI of the head 03/19/2015  . CKD stage 3 due to type 2 diabetes mellitus ([place]) 02/24/2015  . Chronic diastolic CHF (congestive heart failure) ([place]) 02/24/2015  . Acute CVA (cerebrovascular accident) ([place])   . 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 Multiple thyroid nodules 07/18/2014  . HLD (hyperlipidemia) 03/26/2014  . Breast cancer of upper-outer quadrant of left female breast ([place]) 03/22/2014  . 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 Diabetes ([place]) 02/05/2014  . Thyroid nodule 02/05/2014  . H/O malignant neoplasm of breast 02/05/2014  . Essential (primary) hypertension 02/05/2014    Opel Lejeune 07/08/2017, 12:02 PM  [place], [place], [address] Phone:  [phone]   Fax:  [phone]  Name: MARYANNE HUNEYCUTT MRN: [id number] Date of Birth: 1951/04/12  Serafina Royals, PT 07/08/17 12:02 PM

## 2017-07-08 NOTE — Patient Instructions (Signed)
Neck Side-Bending   Begin with chin level, head centered over spine. Slowly lower one ear toward shoulder keeping shoulder down. Hold __3__ seconds. Slowly return to starting position. Repeat to other side.  Repeat __5-10__ times to each side. Do 2-3 times a day. Also stretch by looking over right shoulder, then left.  Scapular Retraction (Standing)   With arms at sides, pinch shoulder blades together.  Hold 3 seconds. Repeat __5-10__ times per set.  Do __2-3__ sessions per day.   Active ROM Flexion    Raise arm to point to ceiling, keeping elbow straight. Hold __3__ seconds. Repeat _5-10___ times. Do _2-3___ sessions per day. Activity: Use this motion to reach for items on overhead shelves.     Flexion (Active)   Palm up, rest elbow on other hand. Bend as far as possible. Hold __3__ seconds. Repeat __5-10__ times. Do _2-3___ sessions per day. Can use other hand to bend elbow further if needed where bandage may limit end motion.  Copyright  VHI. All rights reserved.    Extension (Active With Finger Extension)   Llift hand with fingers straight, then bend wrist down.  Hold _3___ seconds each way. Repeat __5-10__ times. Do _2-3___ sessions per day.    AROM: Forearm Pronation / Supination   With right arm in handshake position, slowly rotate palm down. Relax. Then rotate palm up. Repeat __5-10__ times per set. Do __2-3__ sessions per day.  Copyright  VHI. All rights reserved.    AROM: Finger Flexion / Extension   Actively bend fingers of right hand. Start with knuckles furthest from palm, and slowly make a fist. Hold __3__ seconds. Relax. Then straighten fingers as far as possible. Repeat _5-10___ times per set.  Do __2-3__ sessions per day. Also spread fingers as far as able, then bring them back together.  Copyright  VHI. All rights reserved.   If any pain/tingling symptoms occur, try all of these exercises first to promote circulation. If symptoms do not  ease off, then remove bandages and bring all wrappings back to next appointment.   PLEASE KEEP YOUR BANDAGES ON AS LONG AS POSSIBLE TO GET THE BEST SWELLING REDUCTION. Should your bandages become uncomfortable or feel too tight, follow these steps: 1. Elevate your extremity higher than your heart.  2. Try to move your arm or leg joints against the firmness of the bandage to help with moving the fluid and allow the bandages to loosen a bit.  3. If the bandaging is still is too tight, it is ok to carefully remove the top layer.  There will still be more layers under it that can provide compression to your extremity. 4. Finally, if you STILL have significant pain after trying these steps, it is ok to take the bandage off.  Check your skin carefully for any signs of irritation  5. PLEASE bring ALL bandage materials back to your next appointment as we will reuse what we can TAKE CARE OF YOUR BANDAGES SO THEY WILL LAST LONGER AND STAY IN BETTER CONDITION Washing bandages:  Wash periodically using a mild detergent in warm water.  Do not use fabric softener or bleach.  Place bandages in a mesh lingerie bag or in a tied off pillow case and use the gentle cycle of the washing machine or hand wash. If you hand wash, you may want to put them in the spin cycle of your washer to get the extra water out, but make sure you put them in a mesh bag first.  Do not wring or stretch them while they are wet.  Drying bandages: Lay the bandages out smoothly on a towel away from direct sunlight or heating sources that can damage the fabric. Rolling bandages in a towel and gently squeezing the towel to remove excess water before laying them out can speed up the process.  If you use a drying rack, place a towel on top of the rack to lay the bandages on.  If they hang down to dry, they fabric could be stretched out and the bandage will lose its compression.   Or, keep bandages in the mesh bag and dry them in the dryer on the low or  no heat cycle. Rolling bandages: Please roll your bandages after drying them so they are ready for your next treatment. If they are rolled too loose, they will be difficult to apply.  If rolled too tight, they can get stretched out.   TAKE CARE OF YOUR SKIN 1. Apply a low pH moisturizing lotion to your skin daily 2. Avoid scratching your skin 3. Treat skin irritations quickly  4. Know the 5 warning signs of infection: redness, pain, warmth to touch, fever and increased swelling.  Call your physician immediately if you notice any of these signs of a possible infection.

## 2017-07-12 ENCOUNTER — Ambulatory Visit: Payer: Medicare Other | Admitting: Physical Therapy

## 2017-07-12 DIAGNOSIS — I972 Postmastectomy lymphedema syndrome: Secondary | ICD-10-CM

## 2017-07-12 NOTE — Therapy (Signed)
[place], [place], [address] Phone: [phone]   Fax:  [phone]  Physical Therapy Treatment  Patient Details  Name: Jeanette [name] MRN: [id number] Date of Birth: May 02, 1951 Referring Provider: Armond Hang   Encounter Date: 07/12/2017  PT End of Session - 07/12/17 1632    Visit Number  3    Number of Visits  25    Date for PT Re-Evaluation  09/02/17    PT Start Time  7253    PT Stop Time  1518    PT Time Calculation (min)  41 min    Activity Tolerance  Patient tolerated treatment well    Behavior During Therapy  [facility] for tasks assessed/performed       Past Medical History:  Diagnosis Date  . Anemia   . Arthritis    "back" (07/18/2014)  . Cancer of left breast ([place])   . Chronic back pain   . Headache   . Heart murmur    many years ago  . Hyperlipidemia   . Hypertension   . Hypothyroidism    thyroidectomy 07/18/2014  . Neuromuscular disorder (HCC)    neuropathy hands and feet  . Pneumonia ~ 2010  . Stroke ([place])   . Type II diabetes mellitus ([place])     Past Surgical History:  Procedure Laterality Date  . ABDOMINAL HYSTERECTOMY  1970's  . APPENDECTOMY    . BILATERAL OOPHORECTOMY  1980j's  . BREAST BIOPSY Left 2008  . BUNIONECTOMY Right   . CARDIAC CATHETERIZATION  02/16/2011   NL coronares, EF 65%, no AS or MR (Dr. Corliss Parish; [place])  . CARPAL TUNNEL RELEASE Bilateral   . MASTECTOMY Left 2008  . REDUCTION MAMMAPLASTY Right   . TEE WITHOUT CARDIOVERSION N/A 02/25/2015   Procedure: TRANSESOPHAGEAL ECHOCARDIOGRAM (TEE);  Surgeon: Sueanne Margarita, MD;  Location: [facility] ENDOSCOPY;  Service: Cardiovascular;  Laterality: N/A;  . THYROIDECTOMY N/A 07/18/2014   Procedure: TOTAL THYROIDECTOMY;  Surgeon: Armandina Gemma, MD;  Location: [facility];  Service: General;  Laterality: N/A;  . TONSILLECTOMY    . TOTAL THYROIDECTOMY  07/18/2014    There were no vitals filed for this  visit.  Subjective Assessment - 07/12/17 1438    Subjective  Tolerated the bandages but it itched.  Thumb and index finger bandages came off on Sunday.    Pertinent History  left breast mastectomy with one positive node, not sure how many were taken Dec. 17 2008 in [place] with 4 doses of chemotherapy and 36 doses  of radiation  Past history includes back osteoarthriits, tonsillecty, thyroidectomy, hysterectomy , oophorectomy and bunionectomy on right foo. She had a stroke about 2 years ago, an occasionally has memory problems  She has limited range of motion on left shoulder and occasional neck and shoulder pain     Currently in Pain?  No/denies            LYMPHEDEMA/ONCOLOGY QUESTIONNAIRE - 07/12/17 1442      Left Upper Extremity Lymphedema   15 cm Proximal to Olecranon Process  35.5 cm    10 cm Proximal to Olecranon Process  34.8 cm    Olecranon Process  26.9 cm    15 cm Proximal to Ulnar Styloid Process  28.6 cm    10 cm Proximal to Ulnar Styloid Process  25.5 cm    Just Proximal to Ulnar Styloid Process  19 cm    Across Hand at [employer]  20.8 cm    At [facility] of 2nd Digit  7.3 cm               [facility] Adult PT Treatment/Exercise - 07/12/17 0001      Manual Therapy   Manual Therapy  Edema management    Edema Management  removed bandages and pt. washed arm; circumference measurements taken    Manual Lymphatic Drainage (MLD)  In supine: short neck, superficial and deep abdomen (including instructing patient in diaphragmatic breathing), right axilla and anterior interaxillary anastomosis, left groin and axillo-inguinal anastomosis, and left UE from proximal to distal, then retracing steps.    Compression Bandaging  Thick stockinette, Elastomull x 1 on all fingers, Artiflex x 2, 1-6 cm., 1-10 cm., and 1-12 cm. short stretch bandage from hand to axilla.               PT Short Term Goals - 07/05/17 1716      PT SHORT TERM GOAL #1   Title  Pt  will verbalize lymphedema risk reduction practices     Time  4    Period  Weeks    Status  New      PT SHORT TERM GOAL #2   Title  Pt will have reduction of left forearm at 10 cm proxim to ulna by 1.5 cm ( to 24.5 cm)     Baseline  26 cm    Time  4    Period  Weeks    Status  New      PT SHORT TERM GOAL #3   Title  Pt will be independent in a beginning home program for UE ROM     Time  4    Period  Weeks    Status  New        PT Long Term Goals - 07/05/17 1718      PT LONG TERM GOAL #1   Title  Pt will know how to obtain day and/or nighttime compression garments, do self manual lymph drainage and exercise  so that she can manage her lymphedema at home     Time  8    Period  Weeks    Status  New      PT LONG TERM GOAL #2   Title  Pt will increase painfree left shoulder abduction to 125 degrees so that she can easily put on her shirt without pain     Baseline  85 degrees and painful     Time  8    Period  Weeks    Status  New      PT LONG TERM GOAL #3   Title  Pt will decrease Quick DASH score to < 50 indicating and improvement in function of left arm     Baseline  72.73    Time  8    Period  Weeks    Status  New            Plan - 07/12/17 1632    Clinical Impression Statement  Pt. kept bandages on for four days, though a couple of the fingers came loose. Measurements showed some good reductions from just this first treatment.  Measurements were taken today because it is unclear when she will be back for her next session--this is because her daughter-in-law is being induced to give birth and the patient will be taking care of older grandchildren.  She was asked to remove bandages when needed, perhaps Friday of this week.  Rehab Potential  Good    Clinical Impairments Affecting Rehab Potential  previous radiation and lymph node removal     PT Frequency  3x / week    PT Duration  8 weeks    PT Treatment/Interventions  ADLs/Self Care Home Management;DME  Instruction;Scar mobilization;Passive range of motion;Therapeutic exercise;Therapeutic activities;Orthotic Fit/Training;Patient/family education;Manual techniques;Manual lymph drainage;Compression bandaging;Taping    PT Next Visit Plan  manual lymph drainage, compression bandaging and remedial exercises, educate and sign up for ABC class, later incorporate shoulder ROM, strengthening, postural exercises and manual tecnhniques for shoulder stretching     PT Home Exercise Plan  left UE AROM, neck stretches and breathing    Consulted and Agree with Plan of Care  Patient       Patient will benefit from skilled therapeutic intervention in order to improve the following deficits and impairments:  Decreased knowledge of use of DME, Increased fascial restricitons, Pain, Postural dysfunction, Decreased scar mobility, Decreased range of motion, Decreased strength, Impaired perceived functional ability, Impaired UE functional use, Increased edema, Decreased knowledge of precautions  Visit Diagnosis: Postmastectomy lymphedema     Problem List Patient Active Problem List   Diagnosis Date Noted  . Migraine 03/19/2015  . Abnormal MRI of the head 03/19/2015  . CKD stage 3 due to type 2 diabetes mellitus ([place]) 02/24/2015  . Chronic diastolic CHF (congestive heart failure) ([place]) 02/24/2015  . Acute CVA (cerebrovascular accident) ([place])   . 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 Multiple thyroid nodules 07/18/2014  . HLD (hyperlipidemia) 03/26/2014  . Breast cancer of upper-outer quadrant of left female breast ([place]) 03/22/2014  . 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 Diabetes ([place]) 02/05/2014  . Thyroid nodule 02/05/2014  . H/O malignant neoplasm of breast 02/05/2014  . Essential (primary) hypertension 02/05/2014    Jabori Henegar 07/12/2017, 4:36 PM  [place], [place], [address] Phone: [phone]   Fax:  [phone]  Name: Jeanette [name] MRN: [id number] Date of Birth: Jan 29, 1951  Serafina Royals, PT 07/12/17 4:36 PM

## 2017-07-13 ENCOUNTER — Ambulatory Visit: Payer: Medicare Other

## 2017-07-15 ENCOUNTER — Ambulatory Visit: Payer: Medicare Other | Admitting: Physical Therapy

## 2017-07-15 ENCOUNTER — Encounter: Payer: Self-pay | Admitting: Physical Therapy

## 2017-07-15 DIAGNOSIS — I972 Postmastectomy lymphedema syndrome: Secondary | ICD-10-CM

## 2017-07-15 DIAGNOSIS — M25612 Stiffness of left shoulder, not elsewhere classified: Secondary | ICD-10-CM

## 2017-07-15 DIAGNOSIS — M79602 Pain in left arm: Secondary | ICD-10-CM

## 2017-07-15 DIAGNOSIS — R293 Abnormal posture: Secondary | ICD-10-CM

## 2017-07-15 DIAGNOSIS — M6281 Muscle weakness (generalized): Secondary | ICD-10-CM

## 2017-07-15 NOTE — Therapy (Signed)
[place], [place], [address] Phone: [phone]   Fax:  [phone]  Physical Therapy Treatment  Patient Details  Name: Jeanette [name] MRN: [id number] Date of Birth: 1951/03/15 Referring Provider: Armond Hang   Encounter Date: 07/15/2017  PT End of Session - 07/15/17 1204    Visit Number  4    Date for PT Re-Evaluation  09/02/17    PT Start Time  0805    PT Stop Time  0850    PT Time Calculation (min)  45 min    Activity Tolerance  Patient tolerated treatment well    Behavior During Therapy  [facility] for tasks assessed/performed       Past Medical History:  Diagnosis Date  . Anemia   . Arthritis    "back" (07/18/2014)  . Cancer of left breast ([place])   . Chronic back pain   . Headache   . Heart murmur    many years ago  . Hyperlipidemia   . Hypertension   . Hypothyroidism    thyroidectomy 07/18/2014  . Neuromuscular disorder (HCC)    neuropathy hands and feet  . Pneumonia ~ 2010  . Stroke ([place])   . Type II diabetes mellitus ([place])     Past Surgical History:  Procedure Laterality Date  . ABDOMINAL HYSTERECTOMY  1970's  . APPENDECTOMY    . BILATERAL OOPHORECTOMY  1980j's  . BREAST BIOPSY Left 2008  . BUNIONECTOMY Right   . CARDIAC CATHETERIZATION  02/16/2011   NL coronares, EF 65%, no AS or MR (Dr. Corliss Parish; [place])  . CARPAL TUNNEL RELEASE Bilateral   . MASTECTOMY Left 2008  . REDUCTION MAMMAPLASTY Right   . TEE WITHOUT CARDIOVERSION N/A 02/25/2015   Procedure: TRANSESOPHAGEAL ECHOCARDIOGRAM (TEE);  Surgeon: Sueanne Margarita, MD;  Location[facility] ENDOSCOPY;  Service: Cardiovascular;  Laterality: N/A;  . THYROIDECTOMY N/A 07/18/2014   Procedure: TOTAL THYROIDECTOMY;  Surgeon: Armandina Gemma, MD;  Location: [facility];  Service: General;  Laterality: N/A;  . TONSILLECTOMY    . TOTAL THYROIDECTOMY  07/18/2014    There were no vitals filed for this visit.  Subjective Assessment -  07/15/17 0811    Subjective  Pt had to cut off the bandage and the first finger because it got caught on velcro.   She was able to keep the rest of the bandages on.  she has a little soreness in the forearm and upper arm, but not real pain     Pertinent History  left breast mastectomy with one positive node, not sure how many were taken Dec. 17 2008 in [place] with 4 doses of chemotherapy and 36 doses  of radiation  Past history includes back osteoarthriits, tonsillecty, thyroidectomy, hysterectomy , oophorectomy and bunionectomy on right foo. She had a stroke about 2 years ago, an occasionally has memory problems  She has limited range of motion on left shoulder and occasional neck and shoulder pain     Patient Stated Goals  get rid of the swelling     Currently in Pain?  No/denies            LYMPHEDEMA/ONCOLOGY QUESTIONNAIRE - 07/15/17 0812      Left Upper Extremity Lymphedema   15 cm Proximal to Olecranon Process  35 cm    10 cm Proximal to Olecranon Process  34 cm    Olecranon Process  26.7 cm    15 cm Proximal to Ulnar Styloid Process  28 cm    10 cm Proximal to Ulnar Styloid Process  25 cm    Just Proximal to Ulnar Styloid Process  18 cm    Across Hand at [employer]  20.1 cm    At [place] of 2nd Digit  7.3 cm               [facility] Adult PT Treatment/Exercise - 07/15/17 0001      Shoulder Exercises: Supine   Flexion  AROM;Left;10 reps    Other Supine Exercises  hand remedial ROM exercises for lymphedema       Manual Therapy   Manual Therapy  Edema management;Manual Lymphatic Drainage (MLD);Compression Bandaging    Edema Management  removed bandages and pt. washed arm; circumference measurements taken    Manual Lymphatic Drainage (MLD)  briefly, In supine: short neck, superficial and deep abdomen (including instructing patient in diaphragmatic breathing), right axilla and anterior interaxillary anastomosis, left groin and axillo-inguinal anastomosis,  and left UE from proximal to distal, then retracing steps.Extra time spent on fullness of left thumb and index finger as well as boggy fullness in back of hand     Compression Bandaging  Thick stockinette, thick piece of foam with 3 slits cuts for fingers to go through to provide compression to both dorsal and palmer sides of hand Elastomull x 2 on all fingers,thin foam on back and sides of forearm to even out foam that is on hand  Artiflex x 2, 1-6 cm., 1-10 cm., and 1-12 cm. short stretch bandage from hand to axilla. instructed to rewrap fingers or remove total bandage                PT Short Term Goals - 07/05/17 1716      PT SHORT TERM GOAL #1   Title  Pt will verbalize lymphedema risk reduction practices     Time  4    Period  Weeks    Status  New      PT SHORT TERM GOAL #2   Title  Pt will have reduction of left forearm at 10 cm proxim to ulna by 1.5 cm ( to 24.5 cm)     Baseline  26 cm    Time  4    Period  Weeks    Status  New      PT SHORT TERM GOAL #3   Title  Pt will be independent in a beginning home program for UE ROM     Time  4    Period  Weeks    Status  New        PT Long Term Goals - 07/05/17 1718      PT LONG TERM GOAL #1   Title  Pt will know how to obtain day and/or nighttime compression garments, do self manual lymph drainage and exercise  so that she can manage her lymphedema at home     Time  8    Period  Weeks    Status  New      PT LONG TERM GOAL #2   Title  Pt will increase painfree left shoulder abduction to 125 degrees so that she can easily put on her shirt without pain     Baseline  85 degrees and painful     Time  8    Period  Weeks    Status  New      PT LONG TERM GOAL #3   Title  Pt will decrease Quick  DASH score to < 50 indicating and improvement in function of left arm     Baseline  72.73    Time  8    Period  Weeks    Status  New            Plan - 07/15/17 1204    Clinical Impression Statement  Pt comes in with  bandages on except for thumb and first finger of left hand.  Thenar area of left hand that was out of bandages appears puffy.  Measuresments continue to show some decrease,  Discussed [employer] garments wtih pt and she is pleased.  Will contact sales rep to see about timing for that.  she may be able to use off the shelf flat knit sleeve but may need a custom glove with extra pad. Upgraded foam to hand      Clinical Impairments Affecting Rehab Potential  previous radiation and lymph node removal     PT Duration  8 weeks    PT Next Visit Plan  Determine if upgraded foam helped hand. continue manual lymph drainage ( extra time on tight left axilla ) , compression bandaging and remedial exercises, educate and sign up for ABC class, later incorporate shoulder ROM, strengthening, postural exercises and manual tecnhniques for shoulder stretching        Patient will benefit from skilled therapeutic intervention in order to improve the following deficits and impairments:  Decreased knowledge of use of DME, Increased fascial restricitons, Pain, Postural dysfunction, Decreased scar mobility, Decreased range of motion, Decreased strength, Impaired perceived functional ability, Impaired UE functional use, Increased edema, Decreased knowledge of precautions  Visit Diagnosis: Postmastectomy lymphedema  Stiffness of left shoulder joint  Pain in left arm  Abnormal posture  Muscle weakness (generalized)     Problem List Patient Active Problem List   Diagnosis Date Noted  . Migraine 03/19/2015  . Abnormal MRI of the head 03/19/2015  . CKD stage 3 due to type 2 diabetes mellitus ([place]) 02/24/2015  . Chronic diastolic CHF (congestive heart failure) ([place]) 02/24/2015  . Acute CVA (cerebrovascular accident) ([place])   . 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 Multiple thyroid nodules 07/18/2014  . HLD (hyperlipidemia) 03/26/2014  . Breast  cancer of upper-outer quadrant of left female breast ([place]) 03/22/2014  . 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 Diabetes ([place]) 02/05/2014  . Thyroid nodule 02/05/2014  . H/O malignant neoplasm of breast 02/05/2014  . Essential (primary) hypertension 02/05/2014   Donato Heinz. Owens Shark PT  Norwood Levo 07/15/2017, 12:10 PM  [place], [place], [address] Phone: [phone]   Fax:  [phone]  Name: SIMRIT GOHLKE MRN: [id number] Date of Birth: 1951/04/07

## 2017-07-18 ENCOUNTER — Ambulatory Visit: Payer: Medicare Other

## 2017-07-18 DIAGNOSIS — I972 Postmastectomy lymphedema syndrome: Secondary | ICD-10-CM

## 2017-07-18 NOTE — Therapy (Signed)
[place], [place], [address] Phone: [phone]   Fax:  [phone]  Physical Therapy Treatment  Patient Details  Name: Jeanette [name] MRN: [id number] Date of Birth: 1951/03/09 Referring Provider: Armond Hang   Encounter Date: 07/18/2017  PT End of Session - 07/18/17 1022    Visit Number  5    Number of Visits  25    Date for PT Re-Evaluation  09/02/17    PT Start Time  0938    PT Stop Time  1021    PT Time Calculation (min)  43 min    Activity Tolerance  Patient tolerated treatment well    Behavior During Therapy  [id number] for tasks assessed/performed       Past Medical History:  Diagnosis Date  . Anemia   . Arthritis    "back" (07/18/2014)  . Cancer of left breast ([place])   . Chronic back pain   . Headache   . Heart murmur    many years ago  . Hyperlipidemia   . Hypertension   . Hypothyroidism    thyroidectomy 07/18/2014  . Neuromuscular disorder (HCC)    neuropathy hands and feet  . Pneumonia ~ 2010  . Stroke ([place])   . Type II diabetes mellitus ([place])     Past Surgical History:  Procedure Laterality Date  . ABDOMINAL HYSTERECTOMY  1970's  . APPENDECTOMY    . BILATERAL OOPHORECTOMY  1980j's  . BREAST BIOPSY Left 2008  . BUNIONECTOMY Right   . CARDIAC CATHETERIZATION  02/16/2011   NL coronares, EF 65%, no AS or MR (Dr. Corliss Parish; [place])  . CARPAL TUNNEL RELEASE Bilateral   . MASTECTOMY Left 2008  . REDUCTION MAMMAPLASTY Right   . TEE WITHOUT CARDIOVERSION N/A 02/25/2015   Procedure: TRANSESOPHAGEAL ECHOCARDIOGRAM (TEE);  Surgeon: Sueanne Margarita, MD;  Location: [facility] ENDOSCOPY;  Service: Cardiovascular;  Laterality: N/A;  . THYROIDECTOMY N/A 07/18/2014   Procedure: TOTAL THYROIDECTOMY;  Surgeon: Armandina Gemma, MD;  Location: [facility];  Service: General;  Laterality: N/A;  . TONSILLECTOMY    . TOTAL THYROIDECTOMY  07/18/2014    There were no vitals filed for this  visit.  Subjective Assessment - 07/18/17 0940    Subjective  Had to remove the bandages Saturday morning around 4:00 as they were feeling really tight. So I just rewrapped my arm right then and I lef that on until Sunday.     Pertinent History  left breast mastectomy with one positive node, not sure how many were taken Dec. 17 2008 in [place] with 4 doses of chemotherapy and 36 doses  of radiation  Past history includes back osteoarthriits, tonsillecty, thyroidectomy, hysterectomy , oophorectomy and bunionectomy on right foo. She had a stroke about 2 years ago, an occasionally has memory problems  She has limited range of motion on left shoulder and occasional neck and shoulder pain     Patient Stated Goals  get rid of the swelling     Currently in Pain?  No/denies                      [facility] Adult PT Treatment/Exercise - 07/18/17 0001      Manual Therapy   Manual Lymphatic Drainage (MLD)  In supine: short neck, superficial and deep abdomen (including instructing patient in diaphragmatic breathing), right axilla and anterior interaxillary anastomosis, left groin and axillo-inguinal anastomosis, and left UE from proximal to distal, then  retracing steps.Extra time spent on fullness of left thumb and index finger as well as boggy fullness in back of hand     Compression Bandaging  Thick stockinette, thick piece of foam with 3 slits cuts for fingers to go through to provide compression to both dorsal and palmer sides of hand Elastomull x 2 on all fingers,thin foam on back and sides of forearm to even out foam that is on hand  Artiflex x 2, 1-6 cm., 1-10 cm., and 1-12 cm. short stretch bandage from hand to axilla.               PT Short Term Goals - 07/05/17 1716      PT SHORT TERM GOAL #1   Title  Pt will verbalize lymphedema risk reduction practices     Time  4    Period  Weeks    Status  New      PT SHORT TERM GOAL #2   Title  Pt will have reduction of  left forearm at 10 cm proxim to ulna by 1.5 cm ( to 24.5 cm)     Baseline  26 cm    Time  4    Period  Weeks    Status  New      PT SHORT TERM GOAL #3   Title  Pt will be independent in a beginning home program for UE ROM     Time  4    Period  Weeks    Status  New        PT Long Term Goals - 07/05/17 1718      PT LONG TERM GOAL #1   Title  Pt will know how to obtain day and/or nighttime compression garments, do self manual lymph drainage and exercise  so that she can manage her lymphedema at home     Time  8    Period  Weeks    Status  New      PT LONG TERM GOAL #2   Title  Pt will increase painfree left shoulder abduction to 125 degrees so that she can easily put on her shirt without pain     Baseline  85 degrees and painful     Time  8    Period  Weeks    Status  New      PT LONG TERM GOAL #3   Title  Pt will decrease Quick DASH score to < 50 indicating and improvement in function of left arm     Baseline  72.73    Time  8    Period  Weeks    Status  New            Plan - 07/18/17 1022    Clinical Impression Statement  Pt came in with bandages off but reported had rewrapped herself 2x over weekend. One time removed bandages due to pain, then her wrapping ust didn't last as long as when we do it. Overall though she is pleased with progress thus far and looks forward to further reductions. Continued with complete decongestive treatment.     Rehab Potential  Good    Clinical Impairments Affecting Rehab Potential  previous radiation and lymph node removal     PT Frequency  3x / week    PT Duration  8 weeks    PT Treatment/Interventions  ADLs/Self Care Home Management;DME Instruction;Scar mobilization;Passive range of motion;Therapeutic exercise;Therapeutic activities;Orthotic Fit/Training;Patient/family education;Manual techniques;Manual lymph drainage;Compression bandaging;Taping    PT Next  Visit Plan   continue manual lymph drainage ( extra time on tight left  axilla ) , compression bandaging and remedial exercises, educate and sign up for ABC class, later incorporate shoulder ROM, strengthening, postural exercises and manual tecnhniques for shoulder stretching     Consulted and Agree with Plan of Care  Patient       Patient will benefit from skilled therapeutic intervention in order to improve the following deficits and impairments:  Decreased knowledge of use of DME, Increased fascial restricitons, Pain, Postural dysfunction, Decreased scar mobility, Decreased range of motion, Decreased strength, Impaired perceived functional ability, Impaired UE functional use, Increased edema, Decreased knowledge of precautions  Visit Diagnosis: Postmastectomy lymphedema     Problem List Patient Active Problem List   Diagnosis Date Noted  . Migraine 03/19/2015  . Abnormal MRI of the head 03/19/2015  . CKD stage 3 due to type 2 diabetes mellitus ([place]) 02/24/2015  . Chronic diastolic CHF (congestive heart failure) ([place]) 02/24/2015  . Acute CVA (cerebrovascular accident) ([place])   . 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 Multiple thyroid nodules 07/18/2014  . HLD (hyperlipidemia) 03/26/2014  . Breast cancer of upper-outer quadrant of left female breast ([place]) 03/22/2014  . 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 Diabetes ([place]) 02/05/2014  . Thyroid nodule 02/05/2014  . H/O malignant neoplasm of breast 02/05/2014  . Essential (primary) hypertension 02/05/2014    Golda Acre 07/18/2017, 10:24 AM  [place], [place], [address] Phone: [phone]   Fax:  [phone]  Name: Jeanette [name] MRN: [id number] Date of Birth: Feb 28, 1951

## 2017-07-20 ENCOUNTER — Ambulatory Visit: Payer: Medicare Other

## 2017-07-20 DIAGNOSIS — I972 Postmastectomy lymphedema syndrome: Secondary | ICD-10-CM | POA: Diagnosis not present

## 2017-07-20 NOTE — Therapy (Signed)
[place], [place], [address] Phone: [phone]   Fax:  [phone]  Physical Therapy Treatment  Patient Details  Name: Jeanette [name] MRN: [id number] Date of Birth: May 01, 1951 Referring Provider: Armond Hang   Encounter Date: 07/20/2017  PT End of Session - 07/20/17 1124    Visit Number  6    Number of Visits  25    Date for PT Re-Evaluation  09/02/17    PT Start Time  1024    PT Stop Time  1108    PT Time Calculation (min)  44 min    Activity Tolerance  Patient tolerated treatment well    Behavior During Therapy  [facility] for tasks assessed/performed       Past Medical History:  Diagnosis Date  . Anemia   . Arthritis    "back" (07/18/2014)  . Cancer of left breast ([place])   . Chronic back pain   . Headache   . Heart murmur    many years ago  . Hyperlipidemia   . Hypertension   . Hypothyroidism    thyroidectomy 07/18/2014  . Neuromuscular disorder (HCC)    neuropathy hands and feet  . Pneumonia ~ 2010  . Stroke ([place])   . Type II diabetes mellitus ([place])     Past Surgical History:  Procedure Laterality Date  . ABDOMINAL HYSTERECTOMY  1970's  . APPENDECTOMY    . BILATERAL OOPHORECTOMY  1980j's  . BREAST BIOPSY Left 2008  . BUNIONECTOMY Right   . CARDIAC CATHETERIZATION  02/16/2011   NL coronares, EF 65%, no AS or MR (Dr. Corliss Parish; [place])  . CARPAL TUNNEL RELEASE Bilateral   . MASTECTOMY Left 2008  . REDUCTION MAMMAPLASTY Right   . TEE WITHOUT CARDIOVERSION N/A 02/25/2015   Procedure: TRANSESOPHAGEAL ECHOCARDIOGRAM (TEE);  Surgeon: Sueanne Margarita, MD;  Location: [facility] ENDOSCOPY;  Service: Cardiovascular;  Laterality: N/A;  . THYROIDECTOMY N/A 07/18/2014   Procedure: TOTAL THYROIDECTOMY;  Surgeon: Armandina Gemma, MD;  Location: [facility];  Service: General;  Laterality: N/A;  . TONSILLECTOMY    . TOTAL THYROIDECTOMY  07/18/2014    There were no vitals filed for this  visit.  Subjective Assessment - 07/20/17 1030    Subjective  The bandages stayed on fine, just took them off when I got here.    Pertinent History  left breast mastectomy with one positive node, not sure how many were taken Dec. 17 2008 in [place] with 4 doses of chemotherapy and 36 doses  of radiation  Past history includes back osteoarthriits, tonsillecty, thyroidectomy, hysterectomy , oophorectomy and bunionectomy on right foo. She had a stroke about 2 years ago, an occasionally has memory problems  She has limited range of motion on left shoulder and occasional neck and shoulder pain     Limitations  Lifting;House hold activities    Patient Stated Goals  get rid of the swelling     Currently in Pain?  No/denies            LYMPHEDEMA/ONCOLOGY QUESTIONNAIRE - 07/20/17 1032      Left Upper Extremity Lymphedema   15 cm Proximal to Olecranon Process  34.2 cm    10 cm Proximal to Olecranon Process  33.2 cm    Olecranon Process  26.5 cm    15 cm Proximal to Ulnar Styloid Process  27.6 cm    10 cm Proximal to Ulnar Styloid Process  25.1 cm  Just Proximal to Ulnar Styloid Process  18.5 cm    Across Hand at [employer]  19.8 cm    At [place] of 2nd Digit  7.1 cm               [facility] Adult PT Treatment/Exercise - 07/20/17 0001      Manual Therapy   Manual Lymphatic Drainage (MLD)  In supine: short neck, superficial and deep abdomen (including instructing patient in diaphragmatic breathing), right axilla and anterior interaxillary anastomosis, left groin and axillo-inguinal anastomosis, and left UE from proximal to distal, then retracing steps.Extra time spent on fullness of left thumb and index finger as well as boggy fullness in back of hand     Compression Bandaging  Thick stockinette, thick piece of foam with 3 slits cuts for fingers to go through to provide compression to both dorsal and palmer sides of hand Elastomull x 2 on all fingers,thin foam on back and  sides of forearm to even out foam that is on hand  Artiflex x 2, 1-6 cm., 1-10 cm., and 1-12 cm. short stretch bandage from hand to axilla.               PT Short Term Goals - 07/05/17 1716      PT SHORT TERM GOAL #1   Title  Pt will verbalize lymphedema risk reduction practices     Time  4    Period  Weeks    Status  New      PT SHORT TERM GOAL #2   Title  Pt will have reduction of left forearm at 10 cm proxim to ulna by 1.5 cm ( to 24.5 cm)     Baseline  26 cm    Time  4    Period  Weeks    Status  New      PT SHORT TERM GOAL #3   Title  Pt will be independent in a beginning home program for UE ROM     Time  4    Period  Weeks    Status  New        PT Long Term Goals - 07/05/17 1718      PT LONG TERM GOAL #1   Title  Pt will know how to obtain day and/or nighttime compression garments, do self manual lymph drainage and exercise  so that she can manage her lymphedema at home     Time  8    Period  Weeks    Status  New      PT LONG TERM GOAL #2   Title  Pt will increase painfree left shoulder abduction to 125 degrees so that she can easily put on her shirt without pain     Baseline  85 degrees and painful     Time  8    Period  Weeks    Status  New      PT LONG TERM GOAL #3   Title  Pt will decrease Quick DASH score to < 50 indicating and improvement in function of left arm     Baseline  72.73    Time  8    Period  Weeks    Status  New            Plan - 07/20/17 1125    Clinical Impression Statement  Pt came in with bandages still on. Reports some tenderness at dorsal hand today so cut some of foam off from palmar aspect  to see if this helped at all. Pt reported bandages feeling comfortable at end of session. Most of her circumference measurements had reduced today from last time measured which pt was very pleased with. She is eager to be ready to be measured for compression garments.     Rehab Potential  Good    Clinical Impairments Affecting Rehab  Potential  previous radiation and lymph node removal     PT Frequency  3x / week    PT Duration  8 weeks    PT Treatment/Interventions  ADLs/Self Care Home Management;DME Instruction;Scar mobilization;Passive range of motion;Therapeutic exercise;Therapeutic activities;Orthotic Fit/Training;Patient/family education;Manual techniques;Manual lymph drainage;Compression bandaging;Taping    PT Next Visit Plan   continue manual lymph drainage ( with extra time on tight left axilla ) , compression bandaging and remedial exercises, educate and sign up for ABC class, later incorporate shoulder ROM, strengthening, postural exercises and manual tecnhniques for shoulder stretching     Consulted and Agree with Plan of Care  Patient       Patient will benefit from skilled therapeutic intervention in order to improve the following deficits and impairments:  Decreased knowledge of use of DME, Increased fascial restricitons, Pain, Postural dysfunction, Decreased scar mobility, Decreased range of motion, Decreased strength, Impaired perceived functional ability, Impaired UE functional use, Increased edema, Decreased knowledge of precautions  Visit Diagnosis: Postmastectomy lymphedema     Problem List Patient Active Problem List   Diagnosis Date Noted  . Migraine 03/19/2015  . Abnormal MRI of the head 03/19/2015  . CKD stage 3 due to type 2 diabetes mellitus ([place]) 02/24/2015  . Chronic diastolic CHF (congestive heart failure) ([place]) 02/24/2015  . Acute CVA (cerebrovascular accident) ([place])   . 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 Multiple thyroid nodules 07/18/2014  . HLD (hyperlipidemia) 03/26/2014  . Breast cancer of upper-outer quadrant of left female breast ([place]) 03/22/2014  . 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 Diabetes ([place]) 02/05/2014  . Thyroid nodule 02/05/2014  . H/O malignant neoplasm of breast 02/05/2014  . Essential  (primary) hypertension 02/05/2014    Otelia Limes, PTA 07/20/2017, 11:34 AM  [place], [place], [address] Phone: [phone]   Fax:  [phone]  Name: Jeanette [name] MRN: [id number] Date of Birth: 11-15-1950

## 2017-07-21 ENCOUNTER — Encounter: Payer: Self-pay | Admitting: Gastroenterology

## 2017-07-21 ENCOUNTER — Ambulatory Visit (INDEPENDENT_AMBULATORY_CARE_PROVIDER_SITE_OTHER): Payer: Medicare Other | Admitting: Gastroenterology

## 2017-07-21 ENCOUNTER — Telehealth: Payer: Self-pay

## 2017-07-21 ENCOUNTER — Telehealth: Payer: Self-pay | Admitting: Gastroenterology

## 2017-07-21 VITALS — BP 118/70 | HR 76 | Ht 62.0 in | Wt 182.1 lb

## 2017-07-21 DIAGNOSIS — K5904 Chronic idiopathic constipation: Secondary | ICD-10-CM

## 2017-07-21 DIAGNOSIS — Z7902 Long term (current) use of antithrombotics/antiplatelets: Secondary | ICD-10-CM | POA: Diagnosis not present

## 2017-07-21 DIAGNOSIS — Z860100 Personal history of colon polyps, unspecified: Secondary | ICD-10-CM

## 2017-07-21 MED ORDER — NA SULFATE-K SULFATE-MG SULF 17.5-3.13-1.6 GM/177ML PO SOLN: 1.0000 | Freq: Once | ORAL | 0 refills | Status: DC

## 2017-07-21 NOTE — Telephone Encounter (Signed)
Left a message for patient to return my call. 

## 2017-07-21 NOTE — Telephone Encounter (Signed)
Pt requesting a cheaper prep due to her copay being $40. Pt had ov today 2.28.19.

## 2017-07-21 NOTE — Telephone Encounter (Signed)
   Jeanette [name] Dec 30, 1950 [id number]  Dear Dr. Leonie Man:  We have scheduled the above named patient for a(n) Colonoscopy procedure. Our records show that (s)he is on anticoagulation therapy.  Please advise as to whether the patient may come off their therapy of Plavix 5 days prior to their procedure which is scheduled for 09/07/17.  Please route your response to Marlon Pel, CMA or fax response to [phone].  Sincerely,    [facility]

## 2017-07-21 NOTE — Patient Instructions (Signed)
You have been scheduled for a colonoscopy. Please follow written instructions given to you at your visit today.  Please pick up your prep supplies at the pharmacy within the next 1-3 days. If you use inhalers (even only as needed), please bring them with you on the day of your procedure. Your physician has requested that you go to www.startemmi.com and enter the access code given to you at your visit today. This web site gives a general overview about your procedure. However, you should still follow specific instructions given to you by our office regarding your preparation for the procedure.  Thank you for choosing me and [facility].  Malcolm T. Stark, Jr., MD., FACG  

## 2017-07-21 NOTE — Progress Notes (Signed)
History of Present Illness: This is a 67 year old female referred by Bartholome Bill, MD for the evaluation of history of colon polyps and chronic constipation.  She relates a history of at least 3 prior colonoscopies with the first 1 showing colon polyps.  I have records of only one colonoscopy performed in July 2003 at [facility] by Dr. Chiquita Loth showing moderate internal hemorrhoids and a tortuous and redundant colon.  The indication for that colonoscopy was heme positive stool and a history of colon polyps.  She relates she had her last colonoscopy performed in 2009 that did not show polyps and she was advised to have a 10-year colonoscopy.  She has had constipation for many years and she takes stool softeners intermittently which are effective for her symptoms.  Constipation has not changed and she has no other gastrointestinal complaints.  She is maintained on Plavix and aspirin for history of a CVA.  Denies weight loss, abdominal pain, diarrhea, change in stool caliber, melena, hematochezia, nausea, vomiting, dysphagia, reflux symptoms, chest pain.   Allergies  Allergen Reactions  . Topamax [Topiramate] Other (See Comments)    CAUSED HAIR LOSS   Outpatient Medications Prior to Visit  Medication Sig Dispense Refill  . ACCU-CHEK AVIVA PLUS test strip     . amLODipine (NORVASC) 5 MG tablet Take 5 mg by mouth daily.    Marland Kitchen aspirin EC 81 MG tablet Take 81 mg by mouth daily.    Marland Kitchen atorvastatin (LIPITOR) 20 MG tablet Take 20 mg by mouth every morning.     . calcium-vitamin D (OSCAL WITH D) 500-200 MG-UNIT per tablet Take 2 tablets by mouth 2 (two) times daily. (Patient taking differently: Take 1-2 tablets by mouth daily. ) 60 tablet 1  . clopidogrel (PLAVIX) 75 MG tablet Take 1 tablet (75 mg total) by mouth daily. 90 tablet 3  . divalproex (DEPAKOTE ER) 500 MG 24 hr tablet Take 1 tablet (500 mg total) by mouth 2 (two) times daily. 60 tablet 1  . Docusate Calcium (STOOL SOFTENER PO)  Take by mouth daily as needed.    . gabapentin (NEURONTIN) 300 MG capsule Take 300 mg by mouth 4 (four) times daily.     . hydrochlorothiazide (HYDRODIURIL) 12.5 MG tablet Take 12.5 mg by mouth daily.    Marland Kitchen levothyroxine (SYNTHROID, LEVOTHROID) 88 MCG tablet Take 88 mcg by mouth daily before breakfast.    . meloxicam (MOBIC) 15 MG tablet Take 15 mg by mouth daily.    . metFORMIN (GLUCOPHAGE-XR) 500 MG 24 hr tablet Take 500 mg by mouth 2 (two) times daily.    . Multiple Vitamins-Minerals (MULTIVITAMIN WITH MINERALS) tablet Take 1 tablet by mouth daily.     Marland Kitchen SYNTHROID 88 MCG tablet Take 1 tablet (88 mcg total) by mouth daily before breakfast. 30 tablet 3  . tiZANidine (ZANAFLEX) 4 MG tablet Take 4 mg by mouth every 8 (eight) hours as needed for muscle spasms.     . traMADol (ULTRAM) 50 MG tablet Take 1 tablet (50 mg total) by mouth daily as needed for moderate pain. 30 tablet 0  . amoxicillin-clavulanate (AUGMENTIN) 875-125 MG tablet Take 1 tablet by mouth 2 (two) times daily. One po bid x 7 days (Patient not taking: Reported on 07/05/2017) 14 tablet 0  . diclofenac sodium (VOLTAREN) 1 % GEL Apply 2 g topically 4 (four) times daily. (Patient not taking: Reported on 07/05/2017) 100 g 0   No facility-administered medications prior to visit.  Past Medical History:  Diagnosis Date  . Anemia   . Arthritis    "back" (07/18/2014)  . Cancer of left breast ([place])   . Chronic back pain   . Headache   . Heart murmur    many years ago  . Hyperlipidemia   . Hypertension   . Hypothyroidism    thyroidectomy 07/18/2014  . Neuromuscular disorder (HCC)    neuropathy hands and feet  . Pneumonia ~ 2010  . Stroke ([place])   . Type II diabetes mellitus ([place])    Past Surgical History:  Procedure Laterality Date  . ABDOMINAL HYSTERECTOMY  1970's  . APPENDECTOMY    . BILATERAL OOPHORECTOMY  1980j's  . BREAST BIOPSY Left 2008  . BUNIONECTOMY Right   . CARDIAC CATHETERIZATION  02/16/2011   NL coronares, EF  65%, no AS or MR (Dr. Corliss Parish; [place])  . CARPAL TUNNEL RELEASE Bilateral   . MASTECTOMY Left 2008  . REDUCTION MAMMAPLASTY Right   . TEE WITHOUT CARDIOVERSION N/A 02/25/2015   Procedure: TRANSESOPHAGEAL ECHOCARDIOGRAM (TEE);  Surgeon: Sueanne Margarita, MD;  Location: [facility] END[facility];  Service: Cardiovascular;  Laterality: N/A;  . THYROIDECTOMY N/A 07/18/2014   Procedure: TOTAL THYROIDECTOMY;  Surgeon: Armandina Gemma, MD;  Location: [facility];  Service: General;  Laterality: N/A;  . TONSILLECTOMY    . TOTAL THYROIDECTOMY  07/18/2014   Social History   Socioeconomic History  . Marital status: Divorced    Spouse name: None  . Number of children: None  . Years of education: None  . Highest education level: None  Social Needs  . Financial resource strain: None  . Food insecurity - worry: None  . Food insecurity - inability: None  . Transportation needs - medical: None  . Transportation needs - non-medical: None  Occupational History  . None  Tobacco Use  . Smoking status: Former Smoker    Packs/day: 0.25    Years: 5.00    Pack years: 1.25    Types: Cigarettes    Last attempt to quit: 05/24/1970    Years since quitting: 47.1  . Smokeless tobacco: Never Used  Substance and Sexual Activity  . Alcohol use: No    Comment: 07/18/2014 "quit drinking in the 1980's"  . Drug use: Yes    Types: Marijuana    Comment: occassionally way back when  . Sexual activity: None  Other Topics Concern  . None  Social History Narrative  . None   Family History  Problem Relation Age of Onset  . Hypertension Father   . Diabetes Father   . Hypertension Sister   . Diabetes Sister       Review of Systems: Pertinent positive and negative review of systems were noted in the above HPI section. All other review of systems were otherwise negative.    Physical Exam: General: Well developed, well nourished, no acute distress Head: Normocephalic and atraumatic Eyes:  sclerae anicteric,  EOMI Ears: Normal auditory acuity Mouth: No deformity or lesions Neck: Supple, no masses or thyromegaly Lungs: Clear throughout to auscultation Heart: Regular rate and rhythm; no murmurs, rubs or bruits Abdomen: Soft, non tender and non distended. No masses, hepatosplenomegaly or hernias noted. Normal Bowel sounds Rectal: Deferred to colonoscopy Musculoskeletal: Symmetrical with no gross deformities  Skin: No lesions on visible extremities Pulses:  Normal pulses noted Extremities: No clubbing, cyanosis, edema or deformities noted Neurological: Alert oriented x 4, grossly nonfocal Cervical Nodes:  No significant cervical adenopathy Inguinal Nodes: No significant  inguinal adenopathy Psychological:  Alert and cooperative. Normal mood and affect  Assessment and Recommendations:  1.  History of colon polyps, type unknown.  Recommended to have a 10-year interval colonoscopy by her previous gastroenterologist and by patient's history her last colonoscopy was in 2009.  Attempt to obtain records from all her colonoscopy exams.  Schedule colonoscopy. The risks (including bleeding, perforation, infection, missed lesions, medication reactions and possible hospitalization or surgery if complications occur), benefits, and alternatives to colonoscopy with possible biopsy and possible polypectomy were discussed with the patient and they consent to proceed.   2. Hold Plavix 5 days before procedure - will instruct when and how to resume after procedure. Low but real risk of cardiovascular event such as heart attack, stroke, embolism, thrombosis or ischemia/infarct of other organs off Plavix explained and need to seek urgent help if this occurs. The patient consents to proceed. Will communicate by phone or EMR with patient's prescribing provider to confirm that holding Plavix is reasonable in this case.  She is advised to remain on her daily aspirin.  2.  Chronic idiopathic constipation, mild.  Patient advised  to consider taking Colace daily or twice daily on a regular basis to help prevent episodes of constipation.   cc: Bartholome Bill, MD [address] [place], [place] [address]

## 2017-07-22 ENCOUNTER — Ambulatory Visit: Payer: Medicare Other | Attending: Plastic Surgery | Admitting: Physical Therapy

## 2017-07-22 ENCOUNTER — Encounter: Payer: Self-pay | Admitting: Physical Therapy

## 2017-07-22 DIAGNOSIS — I972 Postmastectomy lymphedema syndrome: Secondary | ICD-10-CM | POA: Diagnosis present

## 2017-07-22 DIAGNOSIS — M25612 Stiffness of left shoulder, not elsewhere classified: Secondary | ICD-10-CM | POA: Insufficient documentation

## 2017-07-22 DIAGNOSIS — R293 Abnormal posture: Secondary | ICD-10-CM | POA: Diagnosis present

## 2017-07-22 DIAGNOSIS — M6281 Muscle weakness (generalized): Secondary | ICD-10-CM | POA: Insufficient documentation

## 2017-07-22 DIAGNOSIS — M79602 Pain in left arm: Secondary | ICD-10-CM | POA: Insufficient documentation

## 2017-07-22 MED ORDER — PEG 3350-KCL-NABCB-NACL-NASULF 236 G PO SOLR: 4000.0000 mL | Freq: Once | ORAL | 0 refills | Status: AC

## 2017-07-22 NOTE — Telephone Encounter (Addendum)
Patient states she cannot afford the $40 copay for Suprep. Patient requests alternative. Informed patient we can send Golytely to her pharmacy and send her new instructions in the mail. Patient verbalized understanding. Informed patient that I am still waiting on her approval regarding her Plavix. Informed patient I will be in touch with her.

## 2017-07-22 NOTE — Addendum Note (Signed)
Addended by: Marzella Schlein on: 07/22/2017 03:59 PM   Modules accepted: Orders

## 2017-07-22 NOTE — Therapy (Signed)
[place], [place], [address] Phone: [phone]   Fax:  [phone]  Physical Therapy Treatment  Patient Details  Name: Jeanette [name] MRN: [id number] Date of Birth: 1950-10-01 Referring Provider: Armond Hang   Encounter Date: 07/22/2017  PT End of Session - 07/22/17 1302    Visit Number  7    Number of Visits  25    PT Start Time  0930    PT Stop Time  1015    PT Time Calculation (min)  45 min    Activity Tolerance  Patient tolerated treatment well       Past Medical History:  Diagnosis Date  . Anemia   . Arthritis    "back" (07/18/2014)  . Cancer of left breast ([place])   . Chronic back pain   . Headache   . Heart murmur    many years ago  . Hyperlipidemia   . Hypertension   . Hypothyroidism    thyroidectomy 07/18/2014  . Neuromuscular disorder (HCC)    neuropathy hands and feet  . Pneumonia ~ 2010  . Stroke ([place])   . Type II diabetes mellitus ([place])     Past Surgical History:  Procedure Laterality Date  . ABDOMINAL HYSTERECTOMY  1970's  . APPENDECTOMY    . BILATERAL OOPHORECTOMY  1980j's  . BREAST BIOPSY Left 2008  . BUNIONECTOMY Right   . CARDIAC CATHETERIZATION  02/16/2011   NL coronares, EF 65%, no AS or MR (Dr. Corliss Parish; [place])  . CARPAL TUNNEL RELEASE Bilateral   . MASTECTOMY Left 2008  . REDUCTION MAMMAPLASTY Right   . TEE WITHOUT CARDIOVERSION N/A 02/25/2015   Procedure: TRANSESOPHAGEAL ECHOCARDIOGRAM (TEE);  Surgeon: Sueanne Margarita, MD;  Location: [facility] ENDOSCOPY;  Service: Cardiovascular;  Laterality: N/A;  . THYROIDECTOMY N/A 07/18/2014   Procedure: TOTAL THYROIDECTOMY;  Surgeon: Armandina Gemma, MD;  Location: [facility];  Service: General;  Laterality: N/A;  . TONSILLECTOMY    . TOTAL THYROIDECTOMY  07/18/2014    There were no vitals filed for this visit.  Subjective Assessment - 07/22/17 1301    Subjective  Pt has been very compliant with bandaging but is  hoping to get measured soon as she is getting tired of wearing them     Pertinent History  left breast mastectomy with one positive node, not sure how many were taken Dec. 17 2008 in [place] with 4 doses of chemotherapy and 36 doses  of radiation  Past history includes back osteoarthriits, tonsillecty, thyroidectomy, hysterectomy , oophorectomy and bunionectomy on right foo. She had a stroke about 2 years ago, an occasionally has memory problems  She has limited range of motion on left shoulder and occasional neck and shoulder pain     Patient Stated Goals  get rid of the swelling                       [facility] Adult PT Treatment/Exercise - 07/22/17 0001      Manual Therapy   Manual Lymphatic Drainage (MLD)  In supine: short neck, superficial and deep abdomen (including instructing patient in diaphragmatic breathing), right axilla and anterior interaxillary anastomosis, left groin and axillo-inguinal anastomosis, and left UE from proximal to distal, then retracing steps.Extra time spent on fullness of left thumb and index finger as well as boggy fullness in back of hand     Compression Bandaging  Thick stockinette, thick piece of foam with 3  slits cuts for fingers to go through to provide compression to both dorsal and palmer sides of hand Elastomull x 2 on all fingers,thin foam on back and sides of forearm to even out foam that is on hand  Artiflex x 2, 1-6 cm., 1-10 cm., and 1-12 cm. short stretch bandage from hand to axilla.               PT Short Term Goals - 07/05/17 1716      PT SHORT TERM GOAL #1   Title  Pt will verbalize lymphedema risk reduction practices     Time  4    Period  Weeks    Status  New      PT SHORT TERM GOAL #2   Title  Pt will have reduction of left forearm at 10 cm proxim to ulna by 1.5 cm ( to 24.5 cm)     Baseline  26 cm    Time  4    Period  Weeks    Status  New      PT SHORT TERM GOAL #3   Title  Pt will be independent  in a beginning home program for UE ROM     Time  4    Period  Weeks    Status  New        PT Long Term Goals - 07/05/17 1718      PT LONG TERM GOAL #1   Title  Pt will know how to obtain day and/or nighttime compression garments, do self manual lymph drainage and exercise  so that she can manage her lymphedema at home     Time  8    Period  Weeks    Status  New      PT LONG TERM GOAL #2   Title  Pt will increase painfree left shoulder abduction to 125 degrees so that she can easily put on her shirt without pain     Baseline  85 degrees and painful     Time  8    Period  Weeks    Status  New      PT LONG TERM GOAL #3   Title  Pt will decrease Quick DASH score to < 50 indicating and improvement in function of left arm     Baseline  72.73    Time  8    Period  Weeks    Status  New            Plan - 07/22/17 1304    Clinical Impression Statement  Pt is showing slow improvment but still has lymphedema in her hand and arm. She will need a nighttime garement too. Just realized pt is receiving oncology care at [facility] so she would qualify for [employer] assistance for new tribute Nighttime with velcro and continue with plan to get donated garment from [facility].  Need to work out details of measurement timing next week     Rehab Potential  Good    Clinical Impairments Affecting Rehab Potential  previous radiation and lymph node removal     PT Frequency  3x / week    PT Duration  8 weeks    PT Treatment/Interventions  ADLs/Self Care Home Management;DME Instruction;Scar mobilization;Passive range of motion;Therapeutic exercise;Therapeutic activities;Orthotic Fit/Training;Patient/family education;Manual techniques;Manual lymph drainage;Compression bandaging;Taping    PT Next Visit Plan   consider measurement for nighttime tribute and send measurements to [facility] for [employer] to pay for garment.  consider [place] measuring  for day garment is Vicente Males is not coming to [place] soon . continue  manual lymph drainage ( with extra time on tight left axilla ) , compression bandaging and remedial exercises, educate and sign up for ABC class, later incorporate shoulder ROM, strengthening, postural exercises and manual tecnhniques for shoulder stretching     Consulted and Agree with Plan of Care  Patient       Patient will benefit from skilled therapeutic intervention in order to improve the following deficits and impairments:  Decreased knowledge of use of DME, Increased fascial restricitons, Pain, Postural dysfunction, Decreased scar mobility, Decreased range of motion, Decreased strength, Impaired perceived functional ability, Impaired UE functional use, Increased edema, Decreased knowledge of precautions  Visit Diagnosis: Postmastectomy lymphedema  Stiffness of left shoulder joint  Pain in left arm  Abnormal posture  Muscle weakness (generalized)     Problem List Patient Active Problem List   Diagnosis Date Noted  . Migraine 03/19/2015  . Abnormal MRI of the head 03/19/2015  . CKD stage 3 due to type 2 diabetes mellitus ([place]) 02/24/2015  . Chronic diastolic CHF (congestive heart failure) ([place]) 02/24/2015  . Acute CVA (cerebrovascular accident) ([place])   . 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 Multiple thyroid nodules 07/18/2014  . HLD (hyperlipidemia) 03/26/2014  . Breast cancer of upper-outer quadrant of left female breast ([place]) 03/22/2014  . 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 Diabetes ([place]) 02/05/2014  . Thyroid nodule 02/05/2014  . H/O malignant neoplasm of breast 02/05/2014  . Essential (primary) hypertension 02/05/2014   Donato Heinz. Owens Shark PT  Norwood Levo 07/22/2017, 1:09 PM  [place], [place], [address] Phone: [phone]   Fax:  [phone]  Name: Jeanette [name] MRN: [id number] Date of Birth:  [birth year]

## 2017-07-22 NOTE — Telephone Encounter (Signed)
Left a message for patient to return my call. 

## 2017-07-22 NOTE — Telephone Encounter (Signed)
Patient returned phone call. °

## 2017-07-25 ENCOUNTER — Ambulatory Visit: Payer: Medicare Other

## 2017-07-25 DIAGNOSIS — I972 Postmastectomy lymphedema syndrome: Secondary | ICD-10-CM

## 2017-07-25 NOTE — Therapy (Signed)
[place], [place], [address] Phone: [phone]   Fax:  [phone]  Physical Therapy Treatment  Patient Details  Name: Jeanette [name] MRN: [id number] Date of Birth: 1951-05-02 Referring Provider: Armond Hang   Encounter Date: 07/25/2017  PT End of Session - 07/25/17 0936    Visit Number  8    Number of Visits  25    Date for PT Re-Evaluation  09/02/17    PT Start Time  0852    PT Stop Time  0936    PT Time Calculation (min)  44 min    Activity Tolerance  Patient tolerated treatment well    Behavior During Therapy  [facility] for tasks assessed/performed       Past Medical History:  Diagnosis Date  . Anemia   . Arthritis    "back" (07/18/2014)  . Cancer of left breast ([place])   . Chronic back pain   . Headache   . Heart murmur    many years ago  . Hyperlipidemia   . Hypertension   . Hypothyroidism    thyroidectomy 07/18/2014  . Neuromuscular disorder (HCC)    neuropathy hands and feet  . Pneumonia ~ 2010  . Stroke ([place])   . Type II diabetes mellitus ([place])     Past Surgical History:  Procedure Laterality Date  . ABDOMINAL HYSTERECTOMY  1970's  . APPENDECTOMY    . BILATERAL OOPHORECTOMY  1980j's  . BREAST BIOPSY Left 2008  . BUNIONECTOMY Right   . CARDIAC CATHETERIZATION  02/16/2011   NL coronares, EF 65%, no AS or MR (Dr. Corliss Parish; [place])  . CARPAL TUNNEL RELEASE Bilateral   . MASTECTOMY Left 2008  . REDUCTION MAMMAPLASTY Right   . TEE WITHOUT CARDIOVERSION N/A 02/25/2015   Procedure: TRANSESOPHAGEAL ECHOCARDIOGRAM (TEE);  Surgeon: Sueanne Margarita, MD;  Location: [facility] ENDOSCOPY;  Service: Cardiovascular;  Laterality: N/A;  . THYROIDECTOMY N/A 07/18/2014   Procedure: TOTAL THYROIDECTOMY;  Surgeon: Armandina Gemma, MD;  Location: [place];  Service: General;  Laterality: N/A;  . TONSILLECTOMY    . TOTAL THYROIDECTOMY  07/18/2014    There were no vitals filed for this  visit.  Subjective Assessment - 07/25/17 0904    Subjective  I took my bandages off this morning. My fingers, especially the 4th one, are really sore and tender. My hand looked really good when I took the bandages off but I felt it getting tighter as I was getting ready.     Pertinent History  left breast mastectomy with one positive node, not sure how many were taken Dec. 17 2008 in [place] with 4 doses of chemotherapy and 36 doses  of radiation  Past history includes back osteoarthriits, tonsillecty, thyroidectomy, hysterectomy , oophorectomy and bunionectomy on right foo. She had a stroke about 2 years ago, an occasionally has memory problems  She has limited range of motion on left shoulder and occasional neck and shoulder pain     Patient Stated Goals  get rid of the swelling     Currently in Pain?  No/denies                      [facility] Adult PT Treatment/Exercise - 07/25/17 0001      Manual Therapy   Manual Lymphatic Drainage (MLD)  In supine: short neck, superficial and deep abdomen (including instructing patient in diaphragmatic breathing), right axilla and anterior interaxillary anastomosis, left groin and  axillo-inguinal anastomosis, and left UE from proximal to distal, then retracing steps.Extra time spent on fullness of left thumb and index finger as well as boggy fullness in back of hand     Compression Bandaging  Thick stockinette,thin foam on back and sides of forearm to even out foam that is on hand  Artiflex x 2, 1-6 cm., 1-10 cm., and 1-12 cm. short stretch bandage from hand to axilla.               PT Short Term Goals - 07/05/17 1716      PT SHORT TERM GOAL #1   Title  Pt will verbalize lymphedema risk reduction practices     Time  4    Period  Weeks    Status  New      PT SHORT TERM GOAL #2   Title  Pt will have reduction of left forearm at 10 cm proxim to ulna by 1.5 cm ( to 24.5 cm)     Baseline  26 cm    Time  4    Period   Weeks    Status  New      PT SHORT TERM GOAL #3   Title  Pt will be independent in a beginning home program for UE ROM     Time  4    Period  Weeks    Status  New        PT Long Term Goals - 07/05/17 1718      PT LONG TERM GOAL #1   Title  Pt will know how to obtain day and/or nighttime compression garments, do self manual lymph drainage and exercise  so that she can manage her lymphedema at home     Time  8    Period  Weeks    Status  New      PT LONG TERM GOAL #2   Title  Pt will increase painfree left shoulder abduction to 125 degrees so that she can easily put on her shirt without pain     Baseline  85 degrees and painful     Time  8    Period  Weeks    Status  New      PT LONG TERM GOAL #3   Title  Pt will decrease Quick DASH score to < 50 indicating and improvement in function of left arm     Baseline  72.73    Time  8    Period  Weeks    Status  New            Plan - 07/25/17 3810    Clinical Impression Statement  Measured pt for [employer] garment during session today so will get those measurements to [facility] so they can order and have [employer] cover financially. Continued with complete decongestive therapy except opted to leave pts fingers unwrapped today as her palmar aspect of fingers were red and tender and fourth finger looked likea blister was trying to appear so felt the ned to let her skin breath. Instructed pt if her fingers begin to really increase in swelling she can remove bandages and rewrap including fingers. But either way can reassess as pt returns for next treatment in 2 days. Still waiting to hear back from [facility] as we just emailed them last week.    Rehab Potential  Good    Clinical Impairments Affecting Rehab Potential  previous radiation and lymph node removal     PT  Frequency  3x / week    PT Duration  8 weeks    PT Treatment/Interventions  ADLs/Self Care Home Management;DME Instruction;Scar mobilization;Passive range of  motion;Therapeutic exercise;Therapeutic activities;Orthotic Fit/Training;Patient/family education;Manual techniques;Manual lymph drainage;Compression bandaging;Taping    PT Next Visit Plan  consider [place] measuring for day garment is Vicente Males is not coming to [place] soon . continue manual lymph drainage ( with extra time on tight left axilla ) , compression bandaging and remedial exercises, educate and sign up for ABC class, later incorporate shoulder ROM, strengthening, postural exercises and manual tecnhniques for shoulder stretching     Consulted and Agree with Plan of Care  Patient       Patient will benefit from skilled therapeutic intervention in order to improve the following deficits and impairments:  Decreased knowledge of use of DME, Increased fascial restricitons, Pain, Postural dysfunction, Decreased scar mobility, Decreased range of motion, Decreased strength, Impaired perceived functional ability, Impaired UE functional use, Increased edema, Decreased knowledge of precautions  Visit Diagnosis: Postmastectomy lymphedema     Problem List Patient Active Problem List   Diagnosis Date Noted  . Migraine 03/19/2015  . Abnormal MRI of the head 03/19/2015  . CKD stage 3 due to type 2 diabetes mellitus ([place]) 02/24/2015  . Chronic diastolic CHF (congestive heart failure) ([place]) 02/24/2015  . Acute CVA (cerebrovascular accident) ([place])   . 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 Multiple thyroid nodules 07/18/2014  . HLD (hyperlipidemia) 03/26/2014  . Breast cancer of upper-outer quadrant of left female breast ([place]) 03/22/2014  . 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 Diabetes ([place]) 02/05/2014  . Thyroid nodule 02/05/2014  . H/O malignant neoplasm of breast 02/05/2014  . Essential (primary) hypertension 02/05/2014    Otelia Limes, PTA 07/25/2017, 9:46 AM  [place], [place], [address] Phone: [phone]   Fax:  [phone]  Name: Jeanette [name] MRN: [id number] Date of Birth: 08/27/1950

## 2017-07-27 ENCOUNTER — Encounter: Payer: Self-pay | Admitting: Rehabilitation

## 2017-07-27 ENCOUNTER — Ambulatory Visit: Payer: Medicare Other | Admitting: Rehabilitation

## 2017-07-27 DIAGNOSIS — I972 Postmastectomy lymphedema syndrome: Secondary | ICD-10-CM | POA: Diagnosis not present

## 2017-07-27 NOTE — Therapy (Signed)
[place], [place], [address] Phone: [phone]   Fax:  [phone]  Physical Therapy Treatment  Patient Details  Name: Jeanette [name] MRN: [id number] Date of Birth: 04-May-1951 Referring Provider: Armond Hang   Encounter Date: 07/27/2017  PT End of Session - 07/27/17 1650    Visit Number  9    Number of Visits  25    Date for PT Re-Evaluation  09/02/17    PT Start Time  1600    PT Stop Time  1645    PT Time Calculation (min)  45 min    Activity Tolerance  Patient tolerated treatment well    Behavior During Therapy  [facility] for tasks assessed/performed       Past Medical History:  Diagnosis Date  . Anemia   . Arthritis    "back" (07/18/2014)  . Cancer of left breast ([place])   . Chronic back pain   . Headache   . Heart murmur    many years ago  . Hyperlipidemia   . Hypertension   . Hypothyroidism    thyroidectomy 07/18/2014  . Neuromuscular disorder (HCC)    neuropathy hands and feet  . Pneumonia ~ 2010  . Stroke ([place])   . Type II diabetes mellitus ([place])     Past Surgical History:  Procedure Laterality Date  . ABDOMINAL HYSTERECTOMY  1970's  . APPENDECTOMY    . BILATERAL OOPHORECTOMY  1980j's  . BREAST BIOPSY Left 2008  . BUNIONECTOMY Right   . CARDIAC CATHETERIZATION  02/16/2011   NL coronares, EF 65%, no AS or MR (Dr. Corliss Parish; [place])  . CARPAL TUNNEL RELEASE Bilateral   . MASTECTOMY Left 2008  . REDUCTION MAMMAPLASTY Right   . TEE WITHOUT CARDIOVERSION N/A 02/25/2015   Procedure: TRANSESOPHAGEAL ECHOCARDIOGRAM (TEE);  Surgeon: Sueanne Margarita, MD;  Location: [facility] ENDOSCOPY;  Service: Cardiovascular;  Laterality: N/A;  . THYROIDECTOMY N/A 07/18/2014   Procedure: TOTAL THYROIDECTOMY;  Surgeon: Armandina Gemma, MD;  Location: [place];  Service: General;  Laterality: N/A;  . TONSILLECTOMY    . TOTAL THYROIDECTOMY  07/18/2014    There were no vitals filed for this  visit.  Subjective Assessment - 07/27/17 1556    Subjective  The fingers are doing better today. has been putting lotion on the open places.  Has been moving today and may be a but more swollen.      Pertinent History  left breast mastectomy with one positive node, not sure how many were taken Dec. 17 2008 in  with 4 doses of chemotherapy and 36 doses  of radiation  Past history includes back osteoarthriits, tonsillecty, thyroidectomy, hysterectomy , oophorectomy and bunionectomy on right foo. She had a stroke about 2 years ago, an occasionally has memory problems  She has limited range of motion on left shoulder and occasional neck and shoulder pain     Currently in Pain?  No/denies                      [facility] Adult PT Treatment/Exercise - 07/27/17 0001      Manual Therapy   Manual Therapy  Edema management;Manual Lymphatic Drainage (MLD);Compression Bandaging    Edema Management  removed bandages; skin with slight redness around the proximal phalanx digits 2-4 but no open places.  Pt requesting another wrap with no fingers    Manual Lymphatic Drainage (MLD)  in supine; short neck, superficial and deep  abdomen, R axilla and interaxillary anastomosis, L inguinal and axillo-inguinal anastomosis, L UE proximal to distal, then retracing steps.  focus on back of hand and fingers    Compression Bandaging  Thick stockinette, thin foam on forearm, artiflex x 1, 1-6cm, 1 10cm, 1-12cm spiral from hand to axilla               PT Short Term Goals - 07/05/17 1716      PT SHORT TERM GOAL #1   Title  Pt will verbalize lymphedema risk reduction practices     Time  4    Period  Weeks    Status  New      PT SHORT TERM GOAL #2   Title  Pt will have reduction of left forearm at 10 cm proxim to ulna by 1.5 cm ( to 24.5 cm)     Baseline  26 cm    Time  4    Period  Weeks    Status  New      PT SHORT TERM GOAL #3   Title  Pt will be independent in a  beginning home program for UE ROM     Time  4    Period  Weeks    Status  New        PT Long Term Goals - 07/05/17 1718      PT LONG TERM GOAL #1   Title  Pt will know how to obtain day and/or nighttime compression garments, do self manual lymph drainage and exercise  so that she can manage her lymphedema at home     Time  8    Period  Weeks    Status  New      PT LONG TERM GOAL #2   Title  Pt will increase painfree left shoulder abduction to 125 degrees so that she can easily put on her shirt without pain     Baseline  85 degrees and painful     Time  8    Period  Weeks    Status  New      PT LONG TERM GOAL #3   Title  Pt will decrease Quick DASH score to < 50 indicating and improvement in function of left arm     Baseline  72.73    Time  8    Period  Weeks    Status  New            Plan - 07/27/17 1651    Clinical Impression Statement  measurements sent to [facility].  pt okay to wait until we hear from them.  Fingers appeared better today with some redness but no open skin.  Pt did request to have fingers left open another time.  pt felt compression was applied with appropriate tightness and will monitor fingers for any increased swelling and re-wrap herself if needed at home.      Rehab Potential  Good    Clinical Impairments Affecting Rehab Potential  previous radiation and lymph node removal     PT Frequency  3x / week    PT Duration  8 weeks    PT Treatment/Interventions  ADLs/Self Care Home Management;DME Instruction;Scar mobilization;Passive range of motion;Therapeutic exercise;Therapeutic activities;Orthotic Fit/Training;Patient/family education;Manual techniques;Manual lymph drainage;Compression bandaging;Taping    PT Next Visit Plan  consider [place] measuring for day garment is Vicente Males is not coming to [place] soon . continue manual lymph drainage ( with extra time on tight left axilla ) ,  compression bandaging and remedial exercises, educate and sign up for  ABC class, later incorporate shoulder ROM, strengthening, postural exercises and manual tecnhniques for shoulder stretching        Patient will benefit from skilled therapeutic intervention in order to improve the following deficits and impairments:  Decreased knowledge of use of DME, Increased fascial restricitons, Pain, Postural dysfunction, Decreased scar mobility, Decreased range of motion, Decreased strength, Impaired perceived functional ability, Impaired UE functional use, Increased edema, Decreased knowledge of precautions  Visit Diagnosis: Postmastectomy lymphedema     Problem List Patient Active Problem List   Diagnosis Date Noted  . Migraine 03/19/2015  . Abnormal MRI of the head 03/19/2015  . CKD stage 3 due to type 2 diabetes mellitus ([place]) 02/24/2015  . Chronic diastolic CHF (congestive heart failure) ([place]) 02/24/2015  . Acute CVA (cerebrovascular accident) ([place])   . 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 Multiple thyroid nodules 07/18/2014  . HLD (hyperlipidemia) 03/26/2014  . Breast cancer of upper-outer quadrant of left female breast ([place]) 03/22/2014  . 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 Diabetes ([place]) 02/05/2014  . Thyroid nodule 02/05/2014  . H/O malignant neoplasm of breast 02/05/2014  . Essential (primary) hypertension 02/05/2014    Stark Bray, DPT, CLT 07/27/2017, 4:54 PM  [place], [place], [address] Phone: [phone]   Fax:  [phone]  Name: ADDILYN SATTERWHITE MRN: [id number] Date of Birth: [birth year]

## 2017-07-29 ENCOUNTER — Encounter: Payer: Self-pay | Admitting: Physical Therapy

## 2017-07-29 ENCOUNTER — Ambulatory Visit: Payer: Medicare Other | Admitting: Physical Therapy

## 2017-07-29 DIAGNOSIS — I972 Postmastectomy lymphedema syndrome: Secondary | ICD-10-CM | POA: Diagnosis not present

## 2017-07-29 DIAGNOSIS — R293 Abnormal posture: Secondary | ICD-10-CM

## 2017-07-29 DIAGNOSIS — M6281 Muscle weakness (generalized): Secondary | ICD-10-CM

## 2017-07-29 DIAGNOSIS — M79602 Pain in left arm: Secondary | ICD-10-CM

## 2017-07-29 DIAGNOSIS — M25612 Stiffness of left shoulder, not elsewhere classified: Secondary | ICD-10-CM

## 2017-07-29 NOTE — Therapy (Signed)
[place], [place], [address] Phone: [phone]   Fax:  [phone]  Physical Therapy Treatment  Patient Details  Name: Jeanette [name] MRN: [id number] Date of Birth: 29-Jul-1950 Referring Provider: Armond Hang   Encounter Date: 07/29/2017  PT End of Session - 07/29/17 1254    Visit Number  10    Number of Visits  25    Date for PT Re-Evaluation  09/02/17    PT Start Time  0930    PT Stop Time  1015    PT Time Calculation (min)  45 min    Activity Tolerance  Patient tolerated treatment well    Behavior During Therapy  [facility] for tasks assessed/performed       Past Medical History:  Diagnosis Date  . Anemia   . Arthritis    "back" (07/18/2014)  . Cancer of left breast ([place])   . Chronic back pain   . Headache   . Heart murmur    many years ago  . Hyperlipidemia   . Hypertension   . Hypothyroidism    thyroidectomy 07/18/2014  . Neuromuscular disorder (HCC)    neuropathy hands and feet  . Pneumonia ~ 2010  . Stroke ([place])   . Type II diabetes mellitus ([place])     Past Surgical History:  Procedure Laterality Date  . ABDOMINAL HYSTERECTOMY  1970's  . APPENDECTOMY    . BILATERAL OOPHORECTOMY  1980j's  . BREAST BIOPSY Left 2008  . BUNIONECTOMY Right   . CARDIAC CATHETERIZATION  02/16/2011   NL coronares, EF 65%, no AS or MR (Dr. Corliss Parish; [place])  . CARPAL TUNNEL RELEASE Bilateral   . MASTECTOMY Left 2008  . REDUCTION MAMMAPLASTY Right   . TEE WITHOUT CARDIOVERSION N/A 02/25/2015   Procedure: TRANSESOPHAGEAL ECHOCARDIOGRAM (TEE);  Surgeon: Sueanne Margarita, MD;  Location: [facility] ENDOSCOPY;  Service: Cardiovascular;  Laterality: N/A;  . THYROIDECTOMY N/A 07/18/2014   Procedure: TOTAL THYROIDECTOMY;  Surgeon: Armandina Gemma, MD;  Location: [place];  Service: General;  Laterality: N/A;  . TONSILLECTOMY    . TOTAL THYROIDECTOMY  07/18/2014    There were no vitals filed for this  visit.  Subjective Assessment - 07/29/17 0937    Subjective  Pt is having stiffness in her axilla  She is anxious to have her fingers wrapped today as she feels they are swelling up     Pertinent History  left breast mastectomy with one positive node, not sure how many were taken Dec. 17 2008 in [place] with 4 doses of chemotherapy and 36 doses  of radiation  Past history includes back osteoarthriits, tonsillecty, thyroidectomy, hysterectomy , oophorectomy and bunionectomy on right foo. She had a stroke about 2 years ago, an occasionally has memory problems  She has limited range of motion on left shoulder and occasional neck and shoulder pain     Patient Stated Goals  get rid of the swelling     Currently in Pain?  Yes    Pain Score  9     Pain Location  -- stiffness     Pain Orientation  Left                      [facility] Adult PT Treatment/Exercise - 07/29/17 0001      Shoulder Exercises: Pulleys   Flexion  2 minutes    Flexion Limitations  cues to keep hip rooted down for more stretch  to axilla     ABduction  2 minutes      Manual Therapy   Manual Lymphatic Drainage (MLD)  in supine; short neck, superficial and deep abdomen, R axilla and interaxillary anastomosis, L inguinal and axillo-inguinal anastomosis, L UE proximal to distal, then retracing steps.  focus on back of hand and fingers    Compression Bandaging  thick  stockinette, allevyn on thenar webspace, instructed pt to wrap fingers with hand tatile and verbal cues and occasional assist, then therapist applied another elastomull to reinforce finger webspace (no foam used today) then 2 artiflex and 4 short stretch bandages from hand to axilla,                PT Short Term Goals - 07/05/17 1716      PT SHORT TERM GOAL #1   Title  Pt will verbalize lymphedema risk reduction practices     Time  4    Period  Weeks    Status  New      PT SHORT TERM GOAL #2   Title  Pt will have reduction of  left forearm at 10 cm proxim to ulna by 1.5 cm ( to 24.5 cm)     Baseline  26 cm    Time  4    Period  Weeks    Status  New      PT SHORT TERM GOAL #3   Title  Pt will be independent in a beginning home program for UE ROM     Time  4    Period  Weeks    Status  New        PT Long Term Goals - 07/05/17 1718      PT LONG TERM GOAL #1   Title  Pt will know how to obtain day and/or nighttime compression garments, do self manual lymph drainage and exercise  so that she can manage her lymphedema at home     Time  8    Period  Weeks    Status  New      PT LONG TERM GOAL #2   Title  Pt will increase painfree left shoulder abduction to 125 degrees so that she can easily put on her shirt without pain     Baseline  85 degrees and painful     Time  8    Period  Weeks    Status  New      PT LONG TERM GOAL #3   Title  Pt will decrease Quick DASH score to < 50 indicating and improvement in function of left arm     Baseline  72.73    Time  8    Period  Weeks    Status  New            Plan - 07/29/17 1254    Clinical Impression Statement  pt comes in with no bandages on.  Skin appears improved with closed area at base of finger and old blister at thenar webspace, but fingers appear swollen. Followed up with [facility] about [employer] delivery . The said it might not come til the end of next week, but I asked them to please rush it.     Rehab Potential  Good    Clinical Impairments Affecting Rehab Potential  previous radiation and lymph node removal     PT Frequency  3x / week    PT Duration  8 weeks    PT Next Visit  Plan   continue manual lymph drainage ( with extra time on tight left axilla ) , compression bandaging and remedial exercises, educate and sign up for ABC class, later incorporate shoulder ROM, strengthening, postural exercises and manual tecnhniques for shoulder stretching     Consulted and Agree with Plan of Care  Patient       Patient will benefit from skilled  therapeutic intervention in order to improve the following deficits and impairments:  Decreased knowledge of use of DME, Increased fascial restricitons, Pain, Postural dysfunction, Decreased scar mobility, Decreased range of motion, Decreased strength, Impaired perceived functional ability, Impaired UE functional use, Increased edema, Decreased knowledge of precautions  Visit Diagnosis: Postmastectomy lymphedema  Stiffness of left shoulder joint  Pain in left arm  Abnormal posture  Muscle weakness (generalized)     Problem List Patient Active Problem List   Diagnosis Date Noted  . Migraine 03/19/2015  . Abnormal MRI of the head 03/19/2015  . CKD stage 3 due to type 2 diabetes mellitus ([place]) 02/24/2015  . Chronic diastolic CHF (congestive heart failure) ([place]) 02/24/2015  . Acute CVA (cerebrovascular accident) ([place])   . 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 Multiple thyroid nodules 07/18/2014  . HLD (hyperlipidemia) 03/26/2014  . Breast cancer of upper-outer quadrant of left female breast ([place]) 03/22/2014  . 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 Diabetes ([place]) 02/05/2014  . Thyroid nodule 02/05/2014  . H/O malignant neoplasm of breast 02/05/2014  . Essential (primary) hypertension 02/05/2014   Donato Heinz. Owens Shark PT  Norwood Levo 07/29/2017, 1:00 PM  [place], [place], [address] Phone: [phone]   Fax:  [phone]  Name: Jeanette [name] MRN: [id number] Date of Birth: [birth year]

## 2017-08-01 ENCOUNTER — Encounter: Payer: Self-pay | Admitting: Physical Therapy

## 2017-08-01 ENCOUNTER — Ambulatory Visit: Payer: Medicare Other | Admitting: Physical Therapy

## 2017-08-01 DIAGNOSIS — M6281 Muscle weakness (generalized): Secondary | ICD-10-CM

## 2017-08-01 DIAGNOSIS — I972 Postmastectomy lymphedema syndrome: Secondary | ICD-10-CM | POA: Diagnosis not present

## 2017-08-01 DIAGNOSIS — M25612 Stiffness of left shoulder, not elsewhere classified: Secondary | ICD-10-CM

## 2017-08-01 DIAGNOSIS — R293 Abnormal posture: Secondary | ICD-10-CM

## 2017-08-01 DIAGNOSIS — M79602 Pain in left arm: Secondary | ICD-10-CM

## 2017-08-01 NOTE — Therapy (Signed)
[place], [place], [address] Phone: [phone]   Fax:  [phone]  Physical Therapy Treatment  Patient Details  Name: Jeanette [name] MRN: [id number] Date of Birth: 03/04/1951 Referring Provider: Armond Hang   Encounter Date: 08/01/2017  PT End of Session - 08/01/17 1558    Visit Number  11    Number of Visits  25    Date for PT Re-Evaluation  09/02/17    PT Start Time  6962    PT Stop Time  1430    PT Time Calculation (min)  45 min    Activity Tolerance  Patient tolerated treatment well    Behavior During Therapy  [facility] for tasks assessed/performed       Past Medical History:  Diagnosis Date  . Anemia   . Arthritis    "back" (07/18/2014)  . Cancer of left breast ([place])   . Chronic back pain   . Headache   . Heart murmur    many years ago  . Hyperlipidemia   . Hypertension   . Hypothyroidism    thyroidectomy 07/18/2014  . Neuromuscular disorder (HCC)    neuropathy hands and feet  . Pneumonia ~ 2010  . Stroke ([place])   . Type II diabetes mellitus ([place])     Past Surgical History:  Procedure Laterality Date  . ABDOMINAL HYSTERECTOMY  1970's  . APPENDECTOMY    . BILATERAL OOPHORECTOMY  1980j's  . BREAST BIOPSY Left 2008  . BUNIONECTOMY Right   . CARDIAC CATHETERIZATION  02/16/2011   NL coronares, EF 65%, no AS or MR (Dr. Corliss Parish; [place])  . CARPAL TUNNEL RELEASE Bilateral   . MASTECTOMY Left 2008  . REDUCTION MAMMAPLASTY Right   . TEE WITHOUT CARDIOVERSION N/A 02/25/2015   Procedure: TRANSESOPHAGEAL ECHOCARDIOGRAM (TEE);  Surgeon: Sueanne Margarita, MD;  Location: [facility] ENDOSCOPY;  Service: Cardiovascular;  Laterality: N/A;  . THYROIDECTOMY N/A 07/18/2014   Procedure: TOTAL THYROIDECTOMY;  Surgeon: Armandina Gemma, MD;  Location: [place];  Service: General;  Laterality: N/A;  . TONSILLECTOMY    . TOTAL THYROIDECTOMY  07/18/2014    There were no vitals filed for this  visit.  Subjective Assessment - 08/01/17 1556    Subjective  Pt was able to keep her bandages on since last Friday and can tell that her hand is not as swollen.  She is looking forward to getting her garments     Pertinent History  left breast mastectomy with one positive node, not sure how many were taken Dec. 17 2008 in [place] with 4 doses of chemotherapy and 36 doses  of radiation  Past history includes back osteoarthriits, tonsillecty, thyroidectomy, hysterectomy , oophorectomy and bunionectomy on right foo. She had a stroke about 2 years ago, an occasionally has memory problems  She has limited range of motion on left shoulder and occasional neck and shoulder pain     Patient Stated Goals  get rid of the swelling     Currently in Pain?  No/denies                      [facility] Adult PT Treatment/Exercise - 08/01/17 0001      Shoulder Exercises: Supine   Other Supine Exercises  chest press x 20 reps with no weight       Manual Therapy   Manual Lymphatic Drainage (MLD)  in supine; short neck, superficial and deep abdomen, R axilla  and interaxillary anastomosis, L inguinal and axillo-inguinal anastomosis, L UE proximal to distal, then retracing steps.  focus on back of hand and fingers    Compression Bandaging  thick  stockinette, allevyn on thenar webspace, instructed pt to wrap fingers with hand tatile and verbal cues and occasional assist, then therapist applied another elastomull to reinforce finger webspace (no foam used today) then 2 artiflex and 4 short stretch bandages from hand to axilla,                PT Short Term Goals - 07/05/17 1716      PT SHORT TERM GOAL #1   Title  Pt will verbalize lymphedema risk reduction practices     Time  4    Period  Weeks    Status  New      PT SHORT TERM GOAL #2   Title  Pt will have reduction of left forearm at 10 cm proxim to ulna by 1.5 cm ( to 24.5 cm)     Baseline  26 cm    Time  4    Period   Weeks    Status  New      PT SHORT TERM GOAL #3   Title  Pt will be independent in a beginning home program for UE ROM     Time  4    Period  Weeks    Status  New        PT Long Term Goals - 07/05/17 1718      PT LONG TERM GOAL #1   Title  Pt will know how to obtain day and/or nighttime compression garments, do self manual lymph drainage and exercise  so that she can manage her lymphedema at home     Time  8    Period  Weeks    Status  New      PT LONG TERM GOAL #2   Title  Pt will increase painfree left shoulder abduction to 125 degrees so that she can easily put on her shirt without pain     Baseline  85 degrees and painful     Time  8    Period  Weeks    Status  New      PT LONG TERM GOAL #3   Title  Pt will decrease Quick DASH score to < 50 indicating and improvement in function of left arm     Baseline  72.73    Time  8    Period  Weeks    Status  New            Plan - 08/01/17 1558    Clinical Impression Statement  Pcontinues to have some improvment in LUE swelling.  Added more shoulder ROM today     Clinical Impairments Affecting Rehab Potential  previous radiation and lymph node removal     PT Frequency  3x / week    PT Duration  8 weeks    PT Next Visit Plan   continue manual lymph drainage ( with extra time on tight left axilla ) , compression bandaging and remedial exercises, educate and sign up for ABC class, later incorporate shoulder ROM, strengthening, postural exercises and manual tecnhniques for shoulder stretching     Consulted and Agree with Plan of Care  Patient       Patient will benefit from skilled therapeutic intervention in order to improve the following deficits and impairments:  Decreased knowledge of use of DME, Increased  fascial restricitons, Pain, Postural dysfunction, Decreased scar mobility, Decreased range of motion, Decreased strength, Impaired perceived functional ability, Impaired UE functional use, Increased edema, Decreased  knowledge of precautions  Visit Diagnosis: Postmastectomy lymphedema  Stiffness of left shoulder joint  Pain in left arm  Abnormal posture  Muscle weakness (generalized)     Problem List Patient Active Problem List   Diagnosis Date Noted  . Migraine 03/19/2015  . Abnormal MRI of the head 03/19/2015  . CKD stage 3 due to type 2 diabetes mellitus ([place]) 02/24/2015  . Chronic diastolic CHF (congestive heart failure) ([place]) 02/24/2015  . Acute CVA (cerebrovascular accident) ([place])   . 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 Multiple thyroid nodules 07/18/2014  . HLD (hyperlipidemia) 03/26/2014  . Breast cancer of upper-outer quadrant of left female breast ([place]) 03/22/2014  . 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 Diabetes ([place]) 02/05/2014  . Thyroid nodule 02/05/2014  . H/O malignant neoplasm of breast 02/05/2014  . Essential (primary) hypertension 02/05/2014   Donato Heinz. Owens Shark PT  Norwood Levo 08/01/2017, 4:00 PM  [place], [place], [address] Phone: [phone]   Fax:  [phone]  Name: Jeanette [name] MRN: [id number] Date of Birth: 1951-02-21

## 2017-08-02 ENCOUNTER — Ambulatory Visit: Payer: Self-pay | Admitting: Nurse Practitioner

## 2017-08-03 ENCOUNTER — Encounter: Payer: Medicare Other | Admitting: Physical Therapy

## 2017-08-03 ENCOUNTER — Ambulatory Visit: Payer: Medicare Other | Admitting: Physical Therapy

## 2017-08-03 ENCOUNTER — Encounter: Payer: Self-pay | Admitting: Physical Therapy

## 2017-08-03 DIAGNOSIS — M79602 Pain in left arm: Secondary | ICD-10-CM

## 2017-08-03 DIAGNOSIS — R293 Abnormal posture: Secondary | ICD-10-CM

## 2017-08-03 DIAGNOSIS — M25612 Stiffness of left shoulder, not elsewhere classified: Secondary | ICD-10-CM

## 2017-08-03 DIAGNOSIS — I972 Postmastectomy lymphedema syndrome: Secondary | ICD-10-CM | POA: Diagnosis not present

## 2017-08-03 DIAGNOSIS — M6281 Muscle weakness (generalized): Secondary | ICD-10-CM

## 2017-08-03 NOTE — Therapy (Signed)
[place], [place], [address] Phone: [phone]   Fax:  [phone]  Physical Therapy Treatment  Patient Details  Name: Jeanette [name] MRN: [id number] Date of Birth: 1950/11/02 Referring Provider: Armond Hang   Encounter Date: 08/03/2017  PT End of Session - 08/03/17 1203    Visit Number  12    Number of Visits  25    Date for PT Re-Evaluation  09/02/17    PT Start Time  1100    PT Stop Time  1145    PT Time Calculation (min)  45 min    Behavior During Therapy  [facility] for tasks assessed/performed       Past Medical History:  Diagnosis Date  . Anemia   . Arthritis    "back" (07/18/2014)  . Cancer of left breast ([place])   . Chronic back pain   . Headache   . Heart murmur    many years ago  . Hyperlipidemia   . Hypertension   . Hypothyroidism    thyroidectomy 07/18/2014  . Neuromuscular disorder (HCC)    neuropathy hands and feet  . Pneumonia ~ 2010  . Stroke ([place])   . Type II diabetes mellitus ([place])     Past Surgical History:  Procedure Laterality Date  . ABDOMINAL HYSTERECTOMY  1970's  . APPENDECTOMY    . BILATERAL OOPHORECTOMY  1980j's  . BREAST BIOPSY Left 2008  . BUNIONECTOMY Right   . CARDIAC CATHETERIZATION  02/16/2011   NL coronares, EF 65%, no AS or MR (Dr. Corliss Parish; [place])  . CARPAL TUNNEL RELEASE Bilateral   . MASTECTOMY Left 2008  . REDUCTION MAMMAPLASTY Right   . TEE WITHOUT CARDIOVERSION N/A 02/25/2015   Procedure: TRANSESOPHAGEAL ECHOCARDIOGRAM (TEE);  Surgeon: Sueanne Margarita, MD;  Location: [facility] ENDOSCOPY;  Service: Cardiovascular;  Laterality: N/A;  . THYROIDECTOMY N/A 07/18/2014   Procedure: TOTAL THYROIDECTOMY;  Surgeon: Armandina Gemma, MD;  Location: [facility];  Service: General;  Laterality: N/A;  . TONSILLECTOMY    . TOTAL THYROIDECTOMY  07/18/2014    There were no vitals filed for this visit.  Subjective Assessment - 08/03/17 1134    Subjective  Pt  received her Tribute Night with velcro and is very happy with that      Pertinent History  left breast mastectomy with one positive node, not sure how many were taken Dec. 17 2008 in [place] with 4 doses of chemotherapy and 36 doses  of radiation  Past history includes back osteoarthriits, tonsillecty, thyroidectomy, hysterectomy , oophorectomy and bunionectomy on right foo. She had a stroke about 2 years ago, an occasionally has memory problems  She has limited range of motion on left shoulder and occasional neck and shoulder pain     Limitations  Lifting;House hold activities    Patient Stated Goals  get rid of the swelling     Currently in Pain?  No/denies                      [facility] Adult PT Treatment/Exercise - 08/03/17 0001      Self-Care   Self-Care  Other Self-Care Comments    Other Self-Care Comments   instructed pt to launder bandages       Manual Therapy   Edema Management  fitted Tribute Night to patient by fixing anchor tabs and adding foam chip pack to palm of hand and base of finers inside of hand piece  and added extra velcor reinforcement to thumb web space on outside of hand piece     Compression Bandaging  thick  stockinette, allevyn on thenar webspace, instructed pt to wrap fingers with hand tatile and verbal cues and occasional assist, then therapist applied another elastomull to reinforce finger webspace (no foam used today) then 2 artiflex and 4 short stretch bandages from hand to axilla,              PT Education - 08/03/17 1159    Education provided  Yes    Education Details  bandage laundering     Person(s) Educated  Patient    Methods  Explanation;Handout    Comprehension  Verbalized understanding       PT Short Term Goals - 07/05/17 1716      PT SHORT TERM GOAL #1   Title  Pt will verbalize lymphedema risk reduction practices     Time  4    Period  Weeks    Status  New      PT SHORT TERM GOAL #2   Title  Pt will  have reduction of left forearm at 10 cm proxim to ulna by 1.5 cm ( to 24.5 cm)     Baseline  26 cm    Time  4    Period  Weeks    Status  New      PT SHORT TERM GOAL #3   Title  Pt will be independent in a beginning home program for UE ROM     Time  4    Period  Weeks    Status  New        PT Long Term Goals - 07/05/17 1718      PT LONG TERM GOAL #1   Title  Pt will know how to obtain day and/or nighttime compression garments, do self manual lymph drainage and exercise  so that she can manage her lymphedema at home     Time  8    Period  Weeks    Status  New      PT LONG TERM GOAL #2   Title  Pt will increase painfree left shoulder abduction to 125 degrees so that she can easily put on her shirt without pain     Baseline  85 degrees and painful     Time  8    Period  Weeks    Status  New      PT LONG TERM GOAL #3   Title  Pt will decrease Quick DASH score to < 50 indicating and improvement in function of left arm     Baseline  72.73    Time  8    Period  Weeks    Status  New            Plan - 08/03/17 1203    Clinical Impression Statement  pt received her tribute night today, It was fitted to her and she was instructed in how to use it.  She was bandaged today and will use the tg soft and tribute night when not in bandages until she gets rewrapped prior to measurment for day garments nex week     Rehab Potential  Good    Clinical Impairments Affecting Rehab Potential  previous radiation and lymph node removal     PT Frequency  3x / week    PT Treatment/Interventions  ADLs/Self Care Home Management;DME Instruction;Scar mobilization;Passive range of motion;Therapeutic exercise;Therapeutic activities;Orthotic Fit/Training;Patient/family education;Manual techniques;Manual  lymph drainage;Compression bandaging;Taping    PT Next Visit Plan   continue manual lymph drainage ( with extra time on tight left axilla ) , compression bandaging and remedial exercises, educate and  sign up for ABC class, later incorporate shoulder ROM, strengthening, postural exercises and manual tecnhniques for shoulder stretching        Patient will benefit from skilled therapeutic intervention in order to improve the following deficits and impairments:  Decreased knowledge of use of DME, Increased fascial restricitons, Pain, Postural dysfunction, Decreased scar mobility, Decreased range of motion, Decreased strength, Impaired perceived functional ability, Impaired UE functional use, Increased edema, Decreased knowledge of precautions  Visit Diagnosis: Postmastectomy lymphedema  Stiffness of left shoulder joint  Pain in left arm  Abnormal posture  Muscle weakness (generalized)     Problem List Patient Active Problem List   Diagnosis Date Noted  . Migraine 03/19/2015  . Abnormal MRI of the head 03/19/2015  . CKD stage 3 due to type 2 diabetes mellitus ([place]) 02/24/2015  . Chronic diastolic CHF (congestive heart failure) ([place]) 02/24/2015  . Acute CVA (cerebrovascular accident) ([place])   . 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 Multiple thyroid nodules 07/18/2014  . HLD (hyperlipidemia) 03/26/2014  . Breast cancer of upper-outer quadrant of left female breast ([place]) 03/22/2014  . 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 Diabetes ([place]) 02/05/2014  . Thyroid nodule 02/05/2014  . H/O malignant neoplasm of breast 02/05/2014  . Essential (primary) hypertension 02/05/2014   Donato Heinz. Owens Shark PT  Norwood Levo 08/03/2017, 12:06 PM  [place], [place], [address] Phone: [phone]   Fax:  [phone]  Name: MYLINDA BROOK MRN: [id number] Date of Birth: [birth year]

## 2017-08-03 NOTE — Patient Instructions (Signed)

## 2017-08-05 ENCOUNTER — Encounter: Payer: Medicare Other | Admitting: Physical Therapy

## 2017-08-08 ENCOUNTER — Encounter: Payer: Medicare Other | Admitting: Physical Therapy

## 2017-08-08 NOTE — Telephone Encounter (Signed)
We have not seen her since Feb 2018 and review of electronic records shows no evidence of recent stroke hence she may hold plavix for 5 days prior to colonoscopy with a small but acceptable periprocedural risk of TIA/stroke if patient is willing

## 2017-08-09 ENCOUNTER — Ambulatory Visit: Payer: Medicare Other

## 2017-08-09 DIAGNOSIS — I972 Postmastectomy lymphedema syndrome: Secondary | ICD-10-CM | POA: Diagnosis not present

## 2017-08-09 NOTE — Therapy (Signed)
[place], [place], [address] Phone: [phone]   Fax:  [phone]  Physical Therapy Treatment  Patient Details  Name: Jeanette [name] MRN: [id number] Date of Birth: 10-11-1950 Referring Provider: Armond Hang   Encounter Date: 08/09/2017  PT End of Session - 08/09/17 1014    Visit Number  13    Number of Visits  25    Date for PT Re-Evaluation  09/02/17    PT Start Time  0933    PT Stop Time  1013    PT Time Calculation (min)  40 min    Activity Tolerance  Patient tolerated treatment well    Behavior During Therapy  [facility] for tasks assessed/performed       Past Medical History:  Diagnosis Date  . Anemia   . Arthritis    "back" (07/18/2014)  . Cancer of left breast ([place])   . Chronic back pain   . Headache   . Heart murmur    many years ago  . Hyperlipidemia   . Hypertension   . Hypothyroidism    thyroidectomy 07/18/2014  . Neuromuscular disorder (HCC)    neuropathy hands and feet  . Pneumonia ~ 2010  . Stroke ([place])   . Type II diabetes mellitus ([place])     Past Surgical History:  Procedure Laterality Date  . ABDOMINAL HYSTERECTOMY  1970's  . APPENDECTOMY    . BILATERAL OOPHORECTOMY  1980j's  . BREAST BIOPSY Left 2008  . BUNIONECTOMY Right   . CARDIAC CATHETERIZATION  02/16/2011   NL coronares, EF 65%, no AS or MR (Dr. Corliss Parish; [place])  . CARPAL TUNNEL RELEASE Bilateral   . MASTECTOMY Left 2008  . REDUCTION MAMMAPLASTY Right   . TEE WITHOUT CARDIOVERSION N/A 02/25/2015   Procedure: TRANSESOPHAGEAL ECHOCARDIOGRAM (TEE);  Surgeon: Sueanne Margarita, MD;  Location: [facility] ENDOSCOPY;  Service: Cardiovascular;  Laterality: N/A;  . THYROIDECTOMY N/A 07/18/2014   Procedure: TOTAL THYROIDECTOMY;  Surgeon: Armandina Gemma, MD;  Location: [facility];  Service: General;  Laterality: N/A;  . TONSILLECTOMY    . TOTAL THYROIDECTOMY  07/18/2014    There were no vitals filed for this  visit.  Subjective Assessment - 08/09/17 0934    Subjective  Been wearing the Tribute at night and during the day some as I could with moving. I rewrapped my hand yesterday.     Pertinent History  left breast mastectomy with one positive node, not sure how many were taken Dec. 17 2008 in [place] with 4 doses of chemotherapy and 36 doses  of radiation  Past history includes back osteoarthriits, tonsillecty, thyroidectomy, hysterectomy , oophorectomy and bunionectomy on right foo. She had a stroke about 2 years ago, an occasionally has memory problems  She has limited range of motion on left shoulder and occasional neck and shoulder pain     Patient Stated Goals  get rid of the swelling     Currently in Pain?  No/denies                      [facility] Adult PT Treatment/Exercise - 08/09/17 0001      Manual Therapy   Manual Lymphatic Drainage (MLD)  in supine; short neck, superficial and deep abdomen, R axilla and interaxillary anastomosis, L inguinal and axillo-inguinal anastomosis, L UE proximal to distal, then retracing steps.  focus on back of hand and fingers    Compression Bandaging  thick  stockinette, Elastomull to all fingers with 1/2" gray foam to dorsal aspect of hand, then 1 artiflex and 3 short stretch bandages from hand to axilla,                PT Short Term Goals - 07/05/17 1716      PT SHORT TERM GOAL #1   Title  Pt will verbalize lymphedema risk reduction practices     Time  4    Period  Weeks    Status  New      PT SHORT TERM GOAL #2   Title  Pt will have reduction of left forearm at 10 cm proxim to ulna by 1.5 cm ( to 24.5 cm)     Baseline  26 cm    Time  4    Period  Weeks    Status  New      PT SHORT TERM GOAL #3   Title  Pt will be independent in a beginning home program for UE ROM     Time  4    Period  Weeks    Status  New        PT Long Term Goals - 07/05/17 1718      PT LONG TERM GOAL #1   Title  Pt will know how  to obtain day and/or nighttime compression garments, do self manual lymph drainage and exercise  so that she can manage her lymphedema at home     Time  8    Period  Weeks    Status  New      PT LONG TERM GOAL #2   Title  Pt will increase painfree left shoulder abduction to 125 degrees so that she can easily put on her shirt without pain     Baseline  85 degrees and painful     Time  8    Period  Weeks    Status  New      PT LONG TERM GOAL #3   Title  Pt will decrease Quick DASH score to < 50 indicating and improvement in function of left arm     Baseline  72.73    Time  8    Period  Weeks    Status  New            Plan - 08/09/17 1014    Clinical Impression Statement  Pt is doing very well overall with maintaining her fluid as able at home but reports never feels as if her bandages stay as well as when we wrap her. She has been wearing her tribute nighttime compression garment each night and during the day as able. she gets measured for her flat knit compression garment Thursday.    Rehab Potential  Good    Clinical Impairments Affecting Rehab Potential  previous radiation and lymph node removal     PT Frequency  3x / week    PT Duration  8 weeks    PT Treatment/Interventions  ADLs/Self Care Home Management;DME Instruction;Scar mobilization;Passive range of motion;Therapeutic exercise;Therapeutic activities;Orthotic Fit/Training;Patient/family education;Manual techniques;Manual lymph drainage;Compression bandaging;Taping    PT Next Visit Plan  Cont complete decongestive therapy; later incorporate shoulder ROM, strengthening, postural exercises and manual tecnhniques for shoulder stretching     Consulted and Agree with Plan of Care  Patient       Patient will benefit from skilled therapeutic intervention in order to improve the following deficits and impairments:  Decreased knowledge of use of DME, Increased fascial  restricitons, Pain, Postural dysfunction, Decreased scar  mobility, Decreased range of motion, Decreased strength, Impaired perceived functional ability, Impaired UE functional use, Increased edema, Decreased knowledge of precautions  Visit Diagnosis: Postmastectomy lymphedema     Problem List Patient Active Problem List   Diagnosis Date Noted  . Migraine 03/19/2015  . Abnormal MRI of the head 03/19/2015  . CKD stage 3 due to type 2 diabetes mellitus ([place]) 02/24/2015  . Chronic diastolic CHF (congestive heart failure) ([place]) 02/24/2015  . Acute CVA (cerebrovascular accident) ([place])   . 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 Multiple thyroid nodules 07/18/2014  . HLD (hyperlipidemia) 03/26/2014  . Breast cancer of upper-outer quadrant of left female breast ([place]) 03/22/2014  . 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 Diabetes ([place]) 02/05/2014  . Thyroid nodule 02/05/2014  . H/O malignant neoplasm of breast 02/05/2014  . Essential (primary) hypertension 02/05/2014    Otelia Limes, PTA 08/09/2017, 10:16 AM  [place], [place], [address] Phone: [phone]   Fax:  [phone]  Name: SHAWONDA KERCE MRN: [id number] Date of Birth: Aug 31, 1950

## 2017-08-09 NOTE — Telephone Encounter (Signed)
Informed patient that per Dr. Leonie Man she can hold Plavix 5 days prior to her procedure. Patient verbalized understanding.

## 2017-08-09 NOTE — Telephone Encounter (Signed)
Left a message for patient to return my call. 

## 2017-08-10 ENCOUNTER — Encounter: Payer: Medicare Other | Admitting: Physical Therapy

## 2017-08-11 ENCOUNTER — Encounter: Payer: Self-pay | Admitting: Physical Therapy

## 2017-08-11 ENCOUNTER — Ambulatory Visit: Payer: Medicare Other | Admitting: Physical Therapy

## 2017-08-11 DIAGNOSIS — M25612 Stiffness of left shoulder, not elsewhere classified: Secondary | ICD-10-CM

## 2017-08-11 DIAGNOSIS — I972 Postmastectomy lymphedema syndrome: Secondary | ICD-10-CM | POA: Diagnosis not present

## 2017-08-11 DIAGNOSIS — M79602 Pain in left arm: Secondary | ICD-10-CM

## 2017-08-11 DIAGNOSIS — M6281 Muscle weakness (generalized): Secondary | ICD-10-CM

## 2017-08-11 DIAGNOSIS — R293 Abnormal posture: Secondary | ICD-10-CM

## 2017-08-12 ENCOUNTER — Encounter: Payer: Medicare Other | Admitting: Physical Therapy

## 2017-08-12 NOTE — Therapy (Signed)
[place], [place], [address] Phone: [phone]   Fax:  [phone]  Physical Therapy Treatment  Patient Details  Name: Jeanette [name] MRN: [id number] Date of Birth: 05/06/1951 Referring Provider: Armond Hang   Encounter Date: 08/11/2017  PT End of Session - 08/12/17 0829    Visit Number  15    Number of Visits  25    Date for PT Re-Evaluation  09/02/17    PT Start Time  4268    PT Stop Time  1600    PT Time Calculation (min)  45 min    Activity Tolerance  Patient tolerated treatment well    Behavior During Therapy  [facility] for tasks assessed/performed       Past Medical History:  Diagnosis Date  . Anemia   . Arthritis    "back" (07/18/2014)  . Cancer of left breast ([place])   . Chronic back pain   . Headache   . Heart murmur    many years ago  . Hyperlipidemia   . Hypertension   . Hypothyroidism    thyroidectomy 07/18/2014  . Neuromuscular disorder (HCC)    neuropathy hands and feet  . Pneumonia ~ 2010  . Stroke ([place])   . Type II diabetes mellitus ([place])     Past Surgical History:  Procedure Laterality Date  . ABDOMINAL HYSTERECTOMY  1970's  . APPENDECTOMY    . BILATERAL OOPHORECTOMY  1980j's  . BREAST BIOPSY Left 2008  . BUNIONECTOMY Right   . CARDIAC CATHETERIZATION  02/16/2011   NL coronares, EF 65%, no AS or MR (Dr. Corliss Parish; [place])  . CARPAL TUNNEL RELEASE Bilateral   . MASTECTOMY Left 2008  . REDUCTION MAMMAPLASTY Right   . TEE WITHOUT CARDIOVERSION N/A 02/25/2015   Procedure: TRANSESOPHAGEAL ECHOCARDIOGRAM (TEE);  Surgeon: Sueanne Margarita, MD;  Location: [facility] ENDOSCOPY;  Service: Cardiovascular;  Laterality: N/A;  . THYROIDECTOMY N/A 07/18/2014   Procedure: TOTAL THYROIDECTOMY;  Surgeon: Armandina Gemma, MD;  Location: [place];  Service: General;  Laterality: N/A;  . TONSILLECTOMY    . TOTAL THYROIDECTOMY  07/18/2014    There were no vitals filed for this  visit.  Subjective Assessment - 08/11/17 1530    Subjective  Pt comes in with bandages intact. She is measured for flat knit compression sleeve and glove. It should arrive in 7-10 business days.     Patient Stated Goals  get rid of the swelling     Currently in Pain?  No/denies                                PT Short Term Goals - 07/05/17 1716      PT SHORT TERM GOAL #1   Title  Pt will verbalize lymphedema risk reduction practices     Time  4    Period  Weeks    Status  New      PT SHORT TERM GOAL #2   Title  Pt will have reduction of left forearm at 10 cm proxim to ulna by 1.5 cm ( to 24.5 cm)     Baseline  26 cm    Time  4    Period  Weeks    Status  New      PT SHORT TERM GOAL #3   Title  Pt will be independent in a beginning home program for UE ROM  Time  4    Period  Weeks    Status  New        PT Long Term Goals - 07/05/17 1718      PT LONG TERM GOAL #1   Title  Pt will know how to obtain day and/or nighttime compression garments, do self manual lymph drainage and exercise  so that she can manage her lymphedema at home     Time  8    Period  Weeks    Status  New      PT LONG TERM GOAL #2   Title  Pt will increase painfree left shoulder abduction to 125 degrees so that she can easily put on her shirt without pain     Baseline  85 degrees and painful     Time  8    Period  Weeks    Status  New      PT LONG TERM GOAL #3   Title  Pt will decrease Quick DASH score to < 50 indicating and improvement in function of left arm     Baseline  72.73    Time  8    Period  Weeks    Status  New            Plan - 08/11/17 1532    Clinical Impression Statement  Pt will continue to wear bandages until compression sleeve arrives. Encouraged her to do shoulder ROM .  She received CircAid reduction kit as alternative to compression bandaging but reinforcement with elastomull  to fingers was needed.     Clinical Impairments Affecting Rehab  Potential  previous radiation and lymph node removal     PT Frequency  3x / week    PT Duration  8 weeks    PT Next Visit Plan  Update goals Cont complete decongestive therapy; later incorporate shoulder ROM, strengthening, postural exercises and manual tecnhniques for shoulder stretching     Consulted and Agree with Plan of Care  Patient       Patient will benefit from skilled therapeutic intervention in order to improve the following deficits and impairments:  Decreased knowledge of use of DME, Increased fascial restricitons, Pain, Postural dysfunction, Decreased scar mobility, Decreased range of motion, Decreased strength, Impaired perceived functional ability, Impaired UE functional use, Increased edema, Decreased knowledge of precautions  Visit Diagnosis: Postmastectomy lymphedema  Stiffness of left shoulder joint  Pain in left arm  Abnormal posture  Muscle weakness (generalized)     Problem List Patient Active Problem List   Diagnosis Date Noted  . Migraine 03/19/2015  . Abnormal MRI of the head 03/19/2015  . CKD stage 3 due to type 2 diabetes mellitus ([place]) 02/24/2015  . Chronic diastolic CHF (congestive heart failure) ([place]) 02/24/2015  . Acute CVA (cerebrovascular accident) ([place])   . 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 Multiple thyroid nodules 07/18/2014  . HLD (hyperlipidemia) 03/26/2014  . Breast cancer of upper-outer quadrant of left female breast ([place]) 03/22/2014  . 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 Diabetes ([place]) 02/05/2014  . Thyroid nodule 02/05/2014  . H/O malignant neoplasm of breast 02/05/2014  . Essential (primary) hypertension 02/05/2014   Donato Heinz. Owens Shark PT  Norwood Levo 08/12/2017, 8:33 AM  [place], [place], [address] Phone: [phone]   Fax:  [phone]  Name: Jeanette [name] MRN: [id number] Date of Birth: 14-Mar-1951

## 2017-08-15 ENCOUNTER — Ambulatory Visit: Payer: Medicare Other | Admitting: Physical Therapy

## 2017-08-15 DIAGNOSIS — I972 Postmastectomy lymphedema syndrome: Secondary | ICD-10-CM

## 2017-08-15 DIAGNOSIS — M25612 Stiffness of left shoulder, not elsewhere classified: Secondary | ICD-10-CM

## 2017-08-15 NOTE — Therapy (Signed)
[place], [place], [address] Phone: [phone]   Fax:  [phone]  Physical Therapy Treatment  Patient Details  Name: Jeanette [name] MRN: [id number] Date of Birth: 05-Jul-1950 Referring Provider: Armond Hang   Encounter Date: 08/15/2017  PT End of Session - 08/15/17 1709    Visit Number  16    Number of Visits  25    Date for PT Re-Evaluation  09/02/17    PT Start Time  3212    PT Stop Time  1349    PT Time Calculation (min)  46 min    Activity Tolerance  Patient tolerated treatment well    Behavior During Therapy  [facility] for tasks assessed/performed       Past Medical History:  Diagnosis Date  . Anemia   . Arthritis    "back" (07/18/2014)  . Cancer of left breast ([place])   . Chronic back pain   . Headache   . Heart murmur    many years ago  . Hyperlipidemia   . Hypertension   . Hypothyroidism    thyroidectomy 07/18/2014  . Neuromuscular disorder (HCC)    neuropathy hands and feet  . Pneumonia ~ 2010  . Stroke ([place])   . Type II diabetes mellitus ([place])     Past Surgical History:  Procedure Laterality Date  . ABDOMINAL HYSTERECTOMY  1970's  . APPENDECTOMY    . BILATERAL OOPHORECTOMY  1980j's  . BREAST BIOPSY Left 2008  . BUNIONECTOMY Right   . CARDIAC CATHETERIZATION  02/16/2011   NL coronares, EF 65%, no AS or MR (Dr. Corliss Parish; [place])  . CARPAL TUNNEL RELEASE Bilateral   . MASTECTOMY Left 2008  . REDUCTION MAMMAPLASTY Right   . TEE WITHOUT CARDIOVERSION N/A 02/25/2015   Procedure: TRANSESOPHAGEAL ECHOCARDIOGRAM (TEE);  Surgeon: Sueanne Margarita, MD;  Location: [facility] ENDOSCOPY;  Service: Cardiovascular;  Laterality: N/A;  . THYROIDECTOMY N/A 07/18/2014   Procedure: TOTAL THYROIDECTOMY;  Surgeon: Armandina Gemma, MD;  Location: [place];  Service: General;  Laterality: N/A;  . TONSILLECTOMY    . TOTAL THYROIDECTOMY  07/18/2014    There were no vitals filed for this  visit.  Subjective Assessment - 08/15/17 1306    Subjective  "I had a little pain the weekend in it--in my hand and a little bit in the arm.  Took bandages off and wore the Lelan Pons and that helped."    Pertinent History  left breast mastectomy with one positive node, not sure how many were taken Dec. 17 2008 in [place] with 4 doses of chemotherapy and 36 doses  of radiation  Past history includes back osteoarthriits, tonsillecty, thyroidectomy, hysterectomy , oophorectomy and bunionectomy on right foo. She had a stroke about 2 years ago, an occasionally has memory problems  She has limited range of motion on left shoulder and occasional neck and shoulder pain     Currently in Pain?  No/denies         [facility] PT Assessment - 08/15/17 0001      AROM   Left Shoulder ABduction  142 Degrees moves toward scaption despite cueing to isolate abduction        LYMPHEDEMA/ONCOLOGY QUESTIONNAIRE - 08/15/17 1315      Left Upper Extremity Lymphedema   15 cm Proximal to Olecranon Process  34.6 cm    10 cm Proximal to Olecranon Process  33.8 cm    Olecranon Process  27.5 cm  15 cm Proximal to Ulnar Styloid Process  28.8 cm    10 cm Proximal to Ulnar Styloid Process  26.4 cm    Just Proximal to Ulnar Styloid Process  20.2 cm    Across Hand at [employer]  20.9 cm    At [place] of 2nd Digit  7.6 cm    Other  came in with Tribute nighttime garment on; had worn it at night but had no compression during the day over this past weekend        Katina Dung - 08/15/17 0001    Do heavy household chores (wash walls, wash floors)  Moderate difficulty    Carry a shopping bag or briefcase  Moderate difficulty    Wash your back  Severe difficulty    Use a knife to cut food  Severe difficulty    Recreational activities in which you take some force or impact through your arm, shoulder, or hand (golf, hammering, tennis)  Mild difficulty    During the past week, to what extent has your arm,  shoulder or hand problem interfered with your normal social activities with family, friends, neighbors, or groups?  Quite a bit    During the past week, to what extent has your arm, shoulder or hand problem limited your work or other regular daily activities  Modererately    Arm, shoulder, or hand pain.  Mild    Tingling (pins and needles) in your arm, shoulder, or hand  Moderate    Difficulty Sleeping  No difficulty    DASH Score  40.91 %      No data recorded       [facility] Adult PT Treatment/Exercise - 08/15/17 0001      Self-Care   Other Self-Care Comments   fashioned a new foam chip pack in thick stockinette for padding to patient's hand and gave that to her to place in [place]      Manual Therapy   Edema Management  circumference measurements taken    Manual Lymphatic Drainage (MLD)  in supine; short neck, superficial and deep abdomen, R axilla and interaxillary anastomosis, L inguinal and axillo-inguinal anastomosis, L UE proximal to distal, then retracing steps.  focus on back of hand and fingers    Compression Bandaging  Pt. donned her own Tribute compression garment, preferring this to being bandaged.             PT Education - 08/15/17 1346    Education provided  Yes    Education Details  lymphedema risk reduction practices    Person(s) Educated  Patient    Methods  Explanation;Handout    Comprehension  Verbalized understanding;Need further instruction       PT Short Term Goals - 08/15/17 1306      PT SHORT TERM GOAL #1   Title  Pt will verbalize lymphedema risk reduction practices     Status  Partially Met      PT SHORT TERM GOAL #2   Title  Pt will have reduction of left forearm at 10 cm proxim to ulna by 1.5 cm ( to 24.5 cm)     Status  On-going      PT SHORT TERM GOAL #3   Title  Pt will be independent in a beginning home program for UE ROM     Status  Achieved        PT Long Term Goals - 08/15/17 1308      PT LONG TERM GOAL #  1   Title   Pt will know how to obtain day and/or nighttime compression garments, do self manual lymph drainage and exercise  so that she can manage her lymphedema at home     Status  Partially Met      PT LONG TERM GOAL #2   Title  Pt will increase painfree left shoulder abduction to 125 degrees so that she can easily put on her shirt without pain     Status  Achieved      PT LONG TERM GOAL #3   Title  Pt will decrease Quick DASH score to < 50 indicating and improvement in function of left arm     Baseline  72.73 at eval; 40.91 on 08/15/17 with 10 of 11 questions answered    Status  Achieved            Plan - 08/15/17 1709    Clinical Impression Statement  Pt. took bandages off because of discomfort and has worn Tribute garment at night only over the weekend, as she was taking care of grandchildren during the ay and felt she couldn't wear it then. Her circumference measurements were increased today overall compared to last time measured. Goals were checked and she has met some but not all of them.  Lymphedema risk reduction practices were instructed; need to check patient's understanding at one of the next sessions.    Rehab Potential  Good    Clinical Impairments Affecting Rehab Potential  previous radiation and lymph node removal     PT Frequency  3x / week    PT Duration  8 weeks    PT Treatment/Interventions  ADLs/Self Care Home Management;DME Instruction;Scar mobilization;Passive range of motion;Therapeutic exercise;Therapeutic activities;Orthotic Fit/Training;Patient/family education;Manual techniques;Manual lymph drainage;Compression bandaging;Taping    PT Next Visit Plan  Have patient verbalize lymphedema risk reduction; continue complete decongestive therapy; teach self-manual lymph drainage when time allows    PT Home Exercise Plan  left UE AROM, neck stretches and breathing    Consulted and Agree with Plan of Care  Patient       Patient will benefit from skilled therapeutic  intervention in order to improve the following deficits and impairments:  Decreased knowledge of use of DME, Increased fascial restricitons, Pain, Postural dysfunction, Decreased scar mobility, Decreased range of motion, Decreased strength, Impaired perceived functional ability, Impaired UE functional use, Increased edema, Decreased knowledge of precautions  Visit Diagnosis: Postmastectomy lymphedema  Stiffness of left shoulder joint     Problem List Patient Active Problem List   Diagnosis Date Noted  . Migraine 03/19/2015  . Abnormal MRI of the head 03/19/2015  . CKD stage 3 due to type 2 diabetes mellitus ([place]) 02/24/2015  . Chronic diastolic CHF (congestive heart failure) ([place]) 02/24/2015  . Acute CVA (cerebrovascular accident) ([place])   . 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 Multiple thyroid nodules 07/18/2014  . HLD (hyperlipidemia) 03/26/2014  . Breast cancer of upper-outer quadrant of left female breast ([place]) 03/22/2014  . 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 Diabetes ([place]) 02/05/2014  . Thyroid nodule 02/05/2014  . H/O malignant neoplasm of breast 02/05/2014  . Essential (primary) hypertension 02/05/2014    June[place] Doughman 08/15/2017, 5:13 PM  [place], [place], [address] Phone: [phone]   Fax:  [phone]  Name: Jeanette [name] MRN: [id number] Date of Birth: 11-Oct-1950  Serafina Royals, PT 08/15/17 5:13 PM

## 2017-08-17 ENCOUNTER — Encounter: Payer: Medicare Other | Admitting: Physical Therapy

## 2017-08-19 ENCOUNTER — Encounter: Payer: Medicare Other | Admitting: Physical Therapy

## 2017-08-24 ENCOUNTER — Encounter: Payer: Self-pay | Admitting: Gastroenterology

## 2017-08-30 ENCOUNTER — Ambulatory Visit: Payer: Medicare Other | Attending: Plastic Surgery

## 2017-08-30 DIAGNOSIS — I972 Postmastectomy lymphedema syndrome: Secondary | ICD-10-CM | POA: Diagnosis present

## 2017-08-30 DIAGNOSIS — M25612 Stiffness of left shoulder, not elsewhere classified: Secondary | ICD-10-CM | POA: Diagnosis present

## 2017-08-30 NOTE — Therapy (Addendum)
[place], [place], [address] Phone: [phone]   Fax:  [phone]  Physical Therapy Treatment  Patient Details  Name: Jeanette [name] MRN: [id number] Date of Birth: 07-May-1951 Referring Provider: Armond Hang   Encounter Date: 08/30/2017  PT End of Session - 08/30/17 1020    Visit Number  91 Added KX to todays charges    Number of Visits  25    Date for PT Re-Evaluation  09/02/17    PT Start Time  0935    PT Stop Time  1023    PT Time Calculation (min)  48 min    Activity Tolerance  Patient tolerated treatment well    Behavior During Therapy  [facility] for tasks assessed/performed       Past Medical History:  Diagnosis Date  . Anemia   . Arthritis    "back" (07/18/2014)  . Cancer of left breast ([place])   . Chronic back pain   . Headache   . Heart murmur    many years ago  . Hyperlipidemia   . Hypertension   . Hypothyroidism    thyroidectomy 07/18/2014  . Neuromuscular disorder (HCC)    neuropathy hands and feet  . Pneumonia ~ 2010  . Stroke ([place])   . Type II diabetes mellitus ([place])     Past Surgical History:  Procedure Laterality Date  . ABDOMINAL HYSTERECTOMY  1970's  . APPENDECTOMY    . BILATERAL OOPHORECTOMY  1980j's  . BREAST BIOPSY Left 2008  . BUNIONECTOMY Right   . CARDIAC CATHETERIZATION  02/16/2011   NL coronares, EF 65%, no AS or MR (Dr. Corliss Parish; [place])  . CARPAL TUNNEL RELEASE Bilateral   . MASTECTOMY Left 2008  . REDUCTION MAMMAPLASTY Right   . TEE WITHOUT CARDIOVERSION N/A 02/25/2015   Procedure: TRANSESOPHAGEAL ECHOCARDIOGRAM (TEE);  Surgeon: Sueanne Margarita, MD;  Location: [facility] ENDOSCOPY;  Service: Cardiovascular;  Laterality: N/A;  . THYROIDECTOMY N/A 07/18/2014   Procedure: TOTAL THYROIDECTOMY;  Surgeon: Armandina Gemma, MD;  Location: [place];  Service: General;  Laterality: N/A;  . TONSILLECTOMY    . TOTAL THYROIDECTOMY  07/18/2014    There were no  vitals filed for this visit.  Subjective Assessment - 08/30/17 0937    Subjective  I'm doing really good. I've been wearing the [employer] daily and at night and I really feel like my arm and hand are doing well. I am ready to get my day time garment. I've been doing aquatic therapy about 3x/wk and that is going really well. My fingers and a little stiff today but I think it's from holding the weights in class yesterday.     Pertinent History  left breast mastectomy with one positive node, not sure how many were taken Dec. 17 2008 in [place] with 4 doses of chemotherapy and 36 doses  of radiation  Past history includes back osteoarthriits, tonsillecty, thyroidectomy, hysterectomy , oophorectomy and bunionectomy on right foo. She had a stroke about 2 years ago, an occasionally has memory problems  She has limited range of motion on left shoulder and occasional neck and shoulder pain     Patient Stated Goals  get rid of the swelling     Currently in Pain?  No/denies            LYMPHEDEMA/ONCOLOGY QUESTIONNAIRE - 08/30/17 0941      Left Upper Extremity Lymphedema   15 cm Proximal to Olecranon Process  33.7 cm    10 cm Proximal to Olecranon Process  32.9 cm    Olecranon Process  26.5 cm    15 cm Proximal to Ulnar Styloid Process  27.6 cm    10 cm Proximal to Ulnar Styloid Process  25.4 cm    Just Proximal to Ulnar Styloid Process  18.2 cm    Across Hand at [employer]  20.4 cm    At [place] of 2nd Digit  7 cm    Other  Came in wearing Tribute Compression garment and has been wearing it night and day                [facility] Adult PT Treatment/Exercise - 08/30/17 0001      Manual Therapy   Edema Management  circumference measurements taken    Manual Lymphatic Drainage (MLD)  in supine; short neck, superficial and deep abdomen, R axilla and interaxillary anastomosis, L inguinal and axillo-inguinal anastomosis, L UE proximal to distal, then retracing steps.  focus  on back of hand and fingers    Compression Bandaging  Pt. donned her own Tribute compression garment, preferring this to being bandaged.               PT Short Term Goals - 08/15/17 1306      PT SHORT TERM GOAL #1   Title  Pt will verbalize lymphedema risk reduction practices     Status  Partially Met      PT SHORT TERM GOAL #2   Title  Pt will have reduction of left forearm at 10 cm proxim to ulna by 1.5 cm ( to 24.5 cm)     Status  On-going      PT SHORT TERM GOAL #3   Title  Pt will be independent in a beginning home program for UE ROM     Status  Achieved        PT Long Term Goals - 08/30/17 1021      PT LONG TERM GOAL #1   Title  Pt will know how to obtain day and/or nighttime compression garments, do self manual lymph drainage and exercise  so that she can manage her lymphedema at home     Status  Achieved      PT LONG TERM GOAL #2   Title  Pt will increase painfree left shoulder abduction to 125 degrees so that she can easily put on her shirt without pain     Baseline  85 degrees and painful; 132 degrees-08/30/17    Status  Achieved      PT LONG TERM GOAL #3   Title  Pt will decrease Quick DASH score to < 50 indicating and improvement in function of left arm     Baseline  72.73 at eval; 40.91 on 08/15/17 with 10 of 11 questions answered    Status  Achieved            Plan - 08/30/17 1024    Clinical Impression Statement  Pt has done excellent with complete decongestive therapy and met all goals. She is wearing her Tribute garment for day and night until her daytime garment arrives (should be end of this week or beginning of next). We will call her for final appointment once this arrives.    Rehab Potential  Good    Clinical Impairments Affecting Rehab Potential  previous radiation and lymph node removal     PT Frequency  3x / week    PT  Duration  8 weeks    PT Treatment/Interventions  ADLs/Self Care Home Management;DME Instruction;Scar  mobilization;Passive range of motion;Therapeutic exercise;Therapeutic activities;Orthotic Fit/Training;Patient/family education;Manual techniques;Manual lymph drainage;Compression bandaging;Taping    PT Next Visit Plan  Issue daytime compression garment assessing fit and D/C if no problems.    Consulted and Agree with Plan of Care  Patient       Patient will benefit from skilled therapeutic intervention in order to improve the following deficits and impairments:  Decreased knowledge of use of DME, Increased fascial restricitons, Pain, Postural dysfunction, Decreased scar mobility, Decreased range of motion, Decreased strength, Impaired perceived functional ability, Impaired UE functional use, Increased edema, Decreased knowledge of precautions  Visit Diagnosis: Postmastectomy lymphedema  Stiffness of left shoulder joint     Problem List Patient Active Problem List   Diagnosis Date Noted  . Migraine 03/19/2015  . Abnormal MRI of the head 03/19/2015  . CKD stage 3 due to type 2 diabetes mellitus ([place]) 02/24/2015  . Chronic diastolic CHF (congestive heart failure) ([place]) 02/24/2015  . Acute CVA (cerebrovascular accident) ([place])   . 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 Multiple thyroid nodules 07/18/2014  . HLD (hyperlipidemia) 03/26/2014  . Breast cancer of upper-outer quadrant of left female breast ([place]) 03/22/2014  . 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 Diabetes ([place]) 02/05/2014  . Thyroid nodule 02/05/2014  . H/O malignant neoplasm of breast 02/05/2014  . Essential (primary) hypertension 02/05/2014    Otelia Limes, PTA 08/30/2017, 12:03 PM  [place], [place], [address] Phone: [phone]   Fax:  [phone]  Name: ANITRIA ANDON MRN: [id number] Date of Birth: [birth year]  PHYSICAL THERAPY DISCHARGE  SUMMARY  Visits from Start of Care: 17  Current functional level related to goals / functional outcomes: unknown   Remaining deficits: unknown   Education / Equipment: Lymphedema risk reduction, self manual lymph drainage and use of compression to manage lymphedema  Plan: Patient agrees to discharge.  Patient goals were not met. Patient is being discharged due to meeting the stated rehab goals.  ?????    Maudry Diego, PT 02/28/18 12:33 PM

## 2017-09-05 ENCOUNTER — Telehealth: Payer: Self-pay | Admitting: Gastroenterology

## 2017-09-05 NOTE — Telephone Encounter (Signed)
Attempted to contact patient with no ring or way to speak to patient. Will try again later.

## 2017-09-05 NOTE — Telephone Encounter (Signed)
Patient states she needs to know what medications to stop prior to her procedure on 09/07/17. Informed patient that I called her and spoke with her to hold her Plavix 5 days prior to her procedure on 08/09/17. Patient states she is currently holding Plavix. Patient states what other medications am I supposed to stop. Informed patient on her procedure instructions it states she is to hold metformin the morning of her procedure. Patient verbalized understanding.

## 2017-09-07 ENCOUNTER — Other Ambulatory Visit: Payer: Self-pay

## 2017-09-07 ENCOUNTER — Encounter: Payer: Self-pay | Admitting: Gastroenterology

## 2017-09-07 ENCOUNTER — Ambulatory Visit (AMBULATORY_SURGERY_CENTER): Payer: Medicare Other | Admitting: Gastroenterology

## 2017-09-07 VITALS — BP 143/74 | HR 73 | Temp 98.0°F | Resp 18 | Ht 62.0 in | Wt 182.0 lb

## 2017-09-07 DIAGNOSIS — D125 Benign neoplasm of sigmoid colon: Secondary | ICD-10-CM | POA: Diagnosis not present

## 2017-09-07 DIAGNOSIS — Z86010 Personal history of colonic polyps: Secondary | ICD-10-CM | POA: Diagnosis not present

## 2017-09-07 MED ORDER — SODIUM CHLORIDE 0.9 % IV SOLN: 500.0000 mL | INTRAVENOUS | Status: DC

## 2017-09-07 NOTE — Progress Notes (Signed)
Called to room to assist during endoscopic procedure.  Patient ID and intended procedure confirmed with present staff. Received instructions for my participation in the procedure from the performing physician.  

## 2017-09-07 NOTE — Progress Notes (Signed)
Pt's states no medical or surgical changes since previsit or office visit. 

## 2017-09-07 NOTE — Op Note (Addendum)
Jeanette [name] Patient Name: Jeanette [name] Procedure Date: 09/07/2017 1:31 PM MRN: [id number] Endoscopist: Ladene Artist , MD Age: 67 Referring MD:  Date of Birth: Jun 24, 1950 Gender: Female Account #: [id number] Procedure:                Colonoscopy Indications:              Surveillance: Personal history of colonic polyps                            (unknown histology) on last colonoscopy more than 5                            years ago (10 years ago) Medicines:                Monitored Anesthesia Care Procedure:                Pre-Anesthesia Assessment:                           - Prior to the procedure, a History and Physical                            was performed, and patient medications and                            allergies were reviewed. The patient's tolerance of                            previous anesthesia was also reviewed. The risks                            and benefits of the procedure and the sedation                            options and risks were discussed with the patient.                            All questions were answered, and informed consent                            was obtained. Prior Anticoagulants: The patient has                            taken Plavix (clopidogrel), last dose was 5 days                            prior to procedure. ASA Grade Assessment: II - A                            patient with mild systemic disease. After reviewing                            the risks and benefits, the patient was deemed in  satisfactory condition to undergo the procedure.                           After obtaining informed consent, the colonoscope                            was passed under direct vision. Throughout the                            procedure, the patient's blood pressure, pulse, and                            oxygen saturations were monitored continuously. The                            Model PCF-H190DL  [id number]) scope was introduced                            through the anus and advanced to the the cecum,                            identified by appendiceal orifice and ileocecal                            valve. The ileocecal valve, appendiceal orifice,                            and rectum were photographed. The quality of the                            bowel preparation was excellent. The colonoscopy                            was performed without difficulty. The patient                            tolerated the procedure well. Scope In: 1:36:07 PM Scope Out: 1:54:21 PM Scope Withdrawal Time: 0 hours 14 minutes 2 seconds  Total Procedure Duration: 0 hours 18 minutes 14 seconds  Findings:                 The perianal and digital rectal examinations were                            normal.                           Two sessile polyps were found in the sigmoid colon.                            The polyps were 5 to 6 mm in size. These polyps                            were removed with a cold snare. Resection and  retrieval were complete.                           A patchy area of mild melanosis was found in the                            cecum.                           The exam was otherwise without abnormality on                            direct and retroflexion views. Complications:            No immediate complications. Estimated blood loss:                            None. Estimated Blood Loss:     Estimated blood loss: none. Impression:               - Two 5 to 6 mm polyps in the sigmoid colon,                            removed with a cold snare. Resected and retrieved.                           - Mild melanosis                           - The examination was otherwise normal on direct                            and retroflexion views. Recommendation:           - Repeat colonoscopy in 5 years for surveillance if                            polyp(s)  are precancerous, otherwise 10 years.                           - Patient has a contact number available for                            emergencies. The signs and symptoms of potential                            delayed complications were discussed with the                            patient. Return to normal activities tomorrow.                            Written discharge instructions were provided to the                            patient.                           -  Resume previous diet.                           - Continue present medications.                           - Await pathology results.                           - Resume Plavix (clopidogrel) at prior dose                            tomorrow. Refer to managing physician for further                            adjustment of therapy. Ladene Artist, MD 09/07/2017 1:58:58 PM This report has been signed electronically.

## 2017-09-07 NOTE — Patient Instructions (Signed)
Impression/Recommendations  Polyps (handout given) Resume PLAVIX Wednesday 09/08/17  YOU HAD AN ENDOSCOPIC PROCEDURE TODAY AT THE [facility]:   Refer to the procedure report that was given to you for any specific questions about what was found during the examination.  If the procedure report does not answer your questions, please call your gastroenterologist to clarify.  If you requested that your care partner not be given the details of your procedure findings, then the procedure report has been included in a sealed envelope for you to review at your convenience later.  YOU SHOULD EXPECT: Some feelings of bloating in the abdomen. Passage of more gas than usual.  Walking can help get rid of the air that was put into your GI tract during the procedure and reduce the bloating. If you had a lower endoscopy (such as a colonoscopy or flexible sigmoidoscopy) you may notice spotting of blood in your stool or on the toilet paper. If you underwent a bowel prep for your procedure, you may not have a normal bowel movement for a few days.  Please Note:  You might notice some irritation and congestion in your nose or some drainage.  This is from the oxygen used during your procedure.  There is no need for concern and it should clear up in a day or so.  SYMPTOMS TO REPORT IMMEDIATELY:   Following lower endoscopy (colonoscopy or flexible sigmoidoscopy):  Excessive amounts of blood in the stool  Significant tenderness or worsening of abdominal pains  Swelling of the abdomen that is new, acute  Fever of 100F or higher  For urgent or emergent issues, a gastroenterologist can be reached at any hour by calling [phone].   DIET:  We do recommend a small meal at first, but then you may proceed to your regular diet.  Drink plenty of fluids but you should avoid alcoholic beverages for 24 hours.  ACTIVITY:  You should plan to take it easy for the rest of today and you should NOT DRIVE or use  heavy machinery until tomorrow (because of the sedation medicines used during the test).    FOLLOW UP: Our staff will call the number listed on your records the next business day following your procedure to check on you and address any questions or concerns that you may have regarding the information given to you following your procedure. If we do not reach you, we will leave a message.  However, if you are feeling well and you are not experiencing any problems, there is no need to return our call.  We will assume that you have returned to your regular daily activities without incident.  If any biopsies were taken you will be contacted by phone or by letter within the next 1-3 weeks.  Please call us at [phone] if you have not heard about the biopsies in 3 weeks.    SIGNATURES/CONFIDENTIALITY: You and/or your care partner have signed paperwork which will be entered into your electronic medical record.  These signatures attest to the fact that that the information above on your After Visit Summary has been reviewed and is understood.  Full responsibility of the confidentiality of this discharge information lies with you and/or your care-partner.

## 2017-09-07 NOTE — Progress Notes (Signed)
Report given to PACU, vss 

## 2017-09-08 ENCOUNTER — Telehealth: Payer: Self-pay

## 2017-09-08 NOTE — Telephone Encounter (Signed)
  Follow up Call-  Call back number 09/07/2017  Post procedure Call Back phone  # [phone]  Permission to leave phone message Yes  Some recent data might be hidden     Patient questions:  Do you have a fever, pain , or abdominal swelling? Yes.   Pain Score  0 *  Have you tolerated food without any problems? Yes.    Have you been able to return to your normal activities? Yes.    Do you have any questions about your discharge instructions: Diet   No. Medications  No. Follow up visit  Yes.    Do you have questions or concerns about your Care? No.  Actions: * If pain score is 4 or above: No action needed, pain <4.

## 2017-09-13 ENCOUNTER — Encounter: Payer: Self-pay | Admitting: Gastroenterology

## 2017-09-15 DIAGNOSIS — I890 Lymphedema, not elsewhere classified: Secondary | ICD-10-CM | POA: Insufficient documentation

## 2017-10-04 ENCOUNTER — Ambulatory Visit: Payer: Medicare Other | Admitting: Nurse Practitioner

## 2017-10-14 ENCOUNTER — Ambulatory Visit: Payer: Medicare Other | Admitting: Nurse Practitioner

## 2017-12-09 ENCOUNTER — Telehealth: Payer: Self-pay

## 2017-12-09 ENCOUNTER — Other Ambulatory Visit: Payer: Self-pay | Admitting: Neurology

## 2017-12-09 NOTE — Telephone Encounter (Signed)
Per Dr. Leonie Man pt was last seen 06/2016.Pt no show 09/2016, pt cancel two appts in May 2019. NO more refills on depakote pt will have to see her PCP to get refills until she is seen in our office. Pt has no more appts.

## 2017-12-09 NOTE — Telephone Encounter (Signed)
Revised. 

## 2018-01-20 ENCOUNTER — Other Ambulatory Visit: Payer: Self-pay | Admitting: Family Medicine

## 2018-01-20 DIAGNOSIS — Z1231 Encounter for screening mammogram for malignant neoplasm of breast: Secondary | ICD-10-CM

## 2018-02-16 ENCOUNTER — Other Ambulatory Visit: Payer: Self-pay | Admitting: Family Medicine

## 2018-02-16 ENCOUNTER — Ambulatory Visit
Admission: RE | Admit: 2018-02-16 | Discharge: 2018-02-16 | Disposition: A | Discharge status: Home / Self Care | Payer: Medicare Other | Source: Ambulatory Visit | Attending: Family Medicine | Admitting: Family Medicine

## 2018-02-16 DIAGNOSIS — Z1231 Encounter for screening mammogram for malignant neoplasm of breast: Secondary | ICD-10-CM

## 2018-02-16 HISTORY — DX: Personal history of antineoplastic chemotherapy: Z92.21

## 2018-02-16 HISTORY — DX: Personal history of irradiation: Z92.3

## 2018-05-16 ENCOUNTER — Telehealth: Payer: Self-pay | Admitting: Hematology and Oncology

## 2018-05-16 NOTE — Telephone Encounter (Signed)
Scheduled appt per 12/24 sch message - left message for patient with appt date and time

## 2018-05-25 ENCOUNTER — Ambulatory Visit: Payer: Medicare Other | Admitting: Hematology and Oncology

## 2018-06-02 ENCOUNTER — Telehealth: Payer: Self-pay | Admitting: Hematology and Oncology

## 2018-06-02 NOTE — Telephone Encounter (Signed)
R/s appt per patient request - pt is aware of appt date and time - pt needed an afternoon appt/

## 2018-06-07 ENCOUNTER — Ambulatory Visit: Payer: Medicare Other | Admitting: Hematology and Oncology

## 2018-06-12 ENCOUNTER — Telehealth: Payer: Self-pay | Admitting: Hematology and Oncology

## 2018-06-12 ENCOUNTER — Inpatient Hospital Stay: Payer: Medicare PPO | Attending: Hematology and Oncology | Admitting: Hematology and Oncology

## 2018-06-12 DIAGNOSIS — Z170 Estrogen receptor positive status [ER+]: Secondary | ICD-10-CM | POA: Insufficient documentation

## 2018-06-12 DIAGNOSIS — Z9221 Personal history of antineoplastic chemotherapy: Secondary | ICD-10-CM | POA: Diagnosis not present

## 2018-06-12 DIAGNOSIS — Z923 Personal history of irradiation: Secondary | ICD-10-CM | POA: Insufficient documentation

## 2018-06-12 DIAGNOSIS — Z9223 Personal history of estrogen therapy: Secondary | ICD-10-CM | POA: Diagnosis not present

## 2018-06-12 DIAGNOSIS — Z7984 Long term (current) use of oral hypoglycemic drugs: Secondary | ICD-10-CM | POA: Diagnosis not present

## 2018-06-12 DIAGNOSIS — C50412 Malignant neoplasm of upper-outer quadrant of left female breast: Secondary | ICD-10-CM

## 2018-06-12 DIAGNOSIS — Z9012 Acquired absence of left breast and nipple: Secondary | ICD-10-CM | POA: Diagnosis not present

## 2018-06-12 DIAGNOSIS — Z853 Personal history of malignant neoplasm of breast: Secondary | ICD-10-CM | POA: Diagnosis present

## 2018-06-12 DIAGNOSIS — Z79899 Other long term (current) drug therapy: Secondary | ICD-10-CM | POA: Diagnosis not present

## 2018-06-12 DIAGNOSIS — Z7982 Long term (current) use of aspirin: Secondary | ICD-10-CM

## 2018-06-12 NOTE — Telephone Encounter (Signed)
Gave avs and calendar ° °

## 2018-06-12 NOTE — Assessment & Plan Note (Signed)
Left breast cancer status post left mastectomy December 2000 and T2, N1, M0 stage IIB ER/PR positive HER-2 negative followed by adjuvant chemotherapy and radiation started Arimidex July 2009 and completed 03/22/2014  Breast Cancer Surveillance: 1. Breast exam 06/12/2018: Normal 2. Mammogram right breast: 02/16/2018: Benign breast density category B.

## 2018-06-12 NOTE — Progress Notes (Signed)
Patient Care Team: Bartholome Bill, MD as PCP - General (Family Medicine)  DIAGNOSIS:  Encounter Diagnosis  Name Primary?  . Malignant neoplasm of upper-outer quadrant of left breast in female, estrogen receptor positive ([place])     SUMMARY OF ONCOLOGIC HISTORY:   Breast cancer of upper-outer quadrant of left female breast ([place])   03/23/2007 Initial Diagnosis    Breast cancer of upper-outer quadrant of left female breast    05/09/2007 Surgery    Left mastectomy T2, N1, M0 stage IIB ER/PR positive HER-2 negative (based on patient's description.- no records available)    06/13/2007 - 08/02/2007 Chemotherapy    Taxotere Cytoxan x4    09/21/2007 - 11/01/2007 Radiation Therapy    Adjuvant radiation therapy    11/28/2007 - 03/22/2014 Anti-estrogen oral therapy    Arimidex 1 mg daily    07/18/2014 Surgery    total thyroidectomy: Benign multinodular goiter     CHIEF COMPLIANT: Follow-up and surveillance of breast cancer  INTERVAL HISTORY: Jeanette [name] is a 19-year with above-mentioned history left breast cancer diagnosed in 2008 treated with mastectomy followed by adjuvant chemotherapy radiation and 6 years of antiestrogen therapy with anastrozole.  She is here for a follow-up visit and reports no new problems or concerns.  Denies any lumps or nodules in the breast.  She has intermittent left breast discomfort.  She is also struggling with some left breast lymphedema for which she underwent a physical therapy.  REVIEW OF SYSTEMS:   Constitutional: Denies fevers, chills or abnormal weight loss Eyes: Denies blurriness of vision Ears, nose, mouth, throat, and face: Denies mucositis or sore throat Respiratory: Denies cough, dyspnea or wheezes Cardiovascular: Denies palpitation, chest discomfort Gastrointestinal:  Denies nausea, heartburn or change in bowel habits Skin: Denies abnormal skin rashes Lymphatics: Denies new lymphadenopathy or easy bruising Neurological:Denies  numbness, tingling or new weaknesses Behavioral/Psych: Mood is stable, no new changes  Extremities: Left breast lymphedema Breast: Left mastectomy All other systems were reviewed with the patient and are negative.  I have reviewed the past medical history, past surgical history, social history and family history with the patient and they are unchanged from previous note.  ALLERGIES:  is allergic to topamax [topiramate].  MEDICATIONS:  Current Outpatient Medications  Medication Sig Dispense Refill  . ACCU-CHEK AVIVA PLUS test strip     . amLODipine (NORVASC) 5 MG tablet Take 5 mg by mouth daily.    Marland Kitchen aspirin EC 81 MG tablet Take 81 mg by mouth daily.    Marland Kitchen atorvastatin (LIPITOR) 20 MG tablet Take 20 mg by mouth every morning.     . calcium-vitamin D (OSCAL WITH D) 500-200 MG-UNIT per tablet Take 2 tablets by mouth 2 (two) times daily. (Patient taking differently: Take 1-2 tablets by mouth daily. ) 60 tablet 1  . clopidogrel (PLAVIX) 75 MG tablet Take 1 tablet (75 mg total) by mouth daily. 90 tablet 3  . divalproex (DEPAKOTE ER) 500 MG 24 hr tablet Take 1 tablet (500 mg total) by mouth 2 (two) times daily. 60 tablet 1  . Docusate Calcium (STOOL SOFTENER PO) Take by mouth daily as needed.    . gabapentin (NEURONTIN) 300 MG capsule Take 300 mg by mouth 4 (four) times daily.     . hydrochlorothiazide (HYDRODIURIL) 12.5 MG tablet Take 12.5 mg by mouth daily.    Marland Kitchen levothyroxine (SYNTHROID, LEVOTHROID) 88 MCG tablet Take 88 mcg by mouth daily before breakfast.    . meloxicam (MOBIC) 15 MG tablet  Take 15 mg by mouth daily.    . metFORMIN (GLUCOPHAGE-XR) 500 MG 24 hr tablet Take 500 mg by mouth 2 (two) times daily.    . Multiple Vitamins-Minerals (MULTIVITAMIN WITH MINERALS) tablet Take 1 tablet by mouth daily.     Marland Kitchen SYNTHROID 88 MCG tablet Take 1 tablet (88 mcg total) by mouth daily before breakfast. 30 tablet 3  . tiZANidine (ZANAFLEX) 4 MG tablet Take 4 mg by mouth every 8 (eight) hours as needed  for muscle spasms.     . traMADol (ULTRAM) 50 MG tablet Take 1 tablet (50 mg total) by mouth daily as needed for moderate pain. (Patient not taking: Reported on 09/07/2017) 30 tablet 0   Current Facility-Administered Medications  Medication Dose Route Frequency Provider Last Rate Last Dose  . 0.9 %  sodium chloride infusion  500 mL Intravenous Continuous Ladene Artist, MD        PHYSICAL EXAMINATION: ECOG PERFORMANCE STATUS: 1 - Symptomatic but completely ambulatory  Vitals:   06/12/18 1507  BP: 139/73  Pulse: 72  Resp: 18  Temp: 98.2 F (36.8 C)  SpO2: 100%   Filed Weights   06/12/18 1507  Weight: 182 lb 11.2 oz (82.9 kg)    GENERAL:alert, no distress and comfortable SKIN: skin color, texture, turgor are normal, no rashes or significant lesions EYES: normal, Conjunctiva are pink and non-injected, sclera clear OROPHARYNX:no exudate, no erythema and lips, buccal mucosa, and tongue normal  NECK: supple, thyroid normal size, non-tender, without nodularity LYMPH:  no palpable lymphadenopathy in the cervical, axillary or inguinal LUNGS: clear to auscultation and percussion with normal breathing effort HEART: regular rate & rhythm and no murmurs and no lower extremity edema ABDOMEN:abdomen soft, non-tender and normal bowel sounds MUSCULOSKELETAL:no cyanosis of digits and no clubbing  NEURO: alert & oriented x 3 with fluent speech, no focal motor/sensory deficits EXTREMITIES: No lower extremity edema BREAST: Left mastectomy, no palpable lumps or nodules in the right breast. (exam performed in the presence of a chaperone)  LABORATORY DATA:  I have reviewed the data as listed CMP Latest Ref Rng & Units 05/04/2017 05/04/2017 02/22/2015  Glucose 65 - 99 mg/dL 127(H) 104(H) 94  BUN 6 - 20 mg/dL '12 9 14  '$ Creatinine 0.44 - 1.00 mg/dL 1.19(H) 1.00 1.22(H)  Sodium 135 - 145 mmol/L 138 145 140  Potassium 3.5 - 5.1 mmol/L 3.7 3.7 5.0  Chloride 101 - 111 mmol/L 104 105 107  CO2 22 - 32  mmol/L 26 - 25  Calcium 8.9 - 10.3 mg/dL 9.3 - 9.4  Total Protein 6.4 - 8.3 g/dL - - -  Total Bilirubin 0.20 - 1.20 mg/dL - - -  Alkaline Phos 40 - 150 U/L - - -  AST 5 - 34 U/L - - -  ALT 0 - 55 U/L - - -    Lab Results  Component Value Date   WBC 6.8 05/04/2017   HGB 13.2 05/04/2017   HCT 40.6 05/04/2017   MCV 83.4 05/04/2017   PLT 227 05/04/2017   NEUTROABS 2.2 05/04/2017    ASSESSMENT & PLAN:  Breast cancer of upper-outer quadrant of left female breast Left breast cancer status post left mastectomy December 2000 and T2, N1, M0 stage IIB ER/PR positive HER-2 negative followed by adjuvant chemotherapy and radiation started Arimidex July 2009 and completed 03/22/2014  Breast Cancer Surveillance: 1. Breast exam 06/12/2018: Normal 2. Mammogram right breast: 02/16/2018: Benign breast density category B.   Follow-up once a year for  follow-up  No orders of the defined types were placed in this encounter.  The patient has a good understanding of the overall plan. she agrees with it. she will call with any problems that may develop before the next visit here.   Harriette Ohara, MD 06/12/18

## 2018-07-18 ENCOUNTER — Institutional Professional Consult (permissible substitution): Payer: Medicare Other | Admitting: Neurology

## 2018-08-15 ENCOUNTER — Telehealth: Payer: Self-pay | Admitting: Neurology

## 2018-08-15 NOTE — Telephone Encounter (Signed)
Please call the patient to advise if the appt needs to be r/s or if she can be part of telemedicine

## 2018-08-16 NOTE — Telephone Encounter (Signed)
FYI Pt has called back SE:[id number], she has called back to provide her new insurance information: [employer] Advantage thru [facility] Member EB#[id number] [phone] RX [id number] No call back requested

## 2018-08-16 NOTE — Telephone Encounter (Signed)
I called pt explain the COVID 19 about not seeing pts in the office. I explain we are doing telephone visit and charge their insurance. Pt gave consent for [employer] and to file insurance.

## 2018-08-17 ENCOUNTER — Institutional Professional Consult (permissible substitution): Payer: Medicare Other | Admitting: Neurology

## 2018-08-24 ENCOUNTER — Ambulatory Visit (INDEPENDENT_AMBULATORY_CARE_PROVIDER_SITE_OTHER): Payer: Medicare Other | Admitting: Gastroenterology

## 2018-08-24 ENCOUNTER — Telehealth: Payer: Self-pay | Admitting: Gastroenterology

## 2018-08-24 ENCOUNTER — Encounter: Payer: Self-pay | Admitting: Gastroenterology

## 2018-08-24 ENCOUNTER — Other Ambulatory Visit: Payer: Self-pay

## 2018-08-24 VITALS — Ht 62.0 in | Wt 182.0 lb

## 2018-08-24 DIAGNOSIS — K5904 Chronic idiopathic constipation: Secondary | ICD-10-CM | POA: Diagnosis not present

## 2018-08-24 DIAGNOSIS — R1010 Upper abdominal pain, unspecified: Secondary | ICD-10-CM

## 2018-08-24 DIAGNOSIS — R159 Full incontinence of feces: Secondary | ICD-10-CM

## 2018-08-24 MED ORDER — PANTOPRAZOLE SODIUM 40 MG PO TBEC: 40.0000 mg | DELAYED_RELEASE_TABLET | Freq: Every day | ORAL | 3 refills | Status: DC

## 2018-08-24 NOTE — Telephone Encounter (Signed)
Patient she told me she never has had pneumonia when I was going over her history prior to her appt today but she remembers now she did. She would like me to put it on her past medical history again.

## 2018-08-24 NOTE — Progress Notes (Signed)
History of Present Illness: This is a 68 year old female who relates a 6-month history of intermittent fecal incontinence of solid stool.  She has had difficulties with constipation for many years and generally has a bowel movement about 3-4 days unless she takes a laxative or stool softener. She related burning upper abdominal pain for several weeks not changed with meals of bowel movements. Denies weight loss, diarrhea, change in stool caliber, melena, hematochezia, nausea, vomiting, dysphagia, reflux symptoms, chest pain.  Colonoscopy 08/2017:  - Two 5 to 6 mm polyps in the sigmoid colon, removed with a cold snare. Resected and retrieved. (hyperplastic) - Mild melanosis - The examination was otherwise normal on direct and retroflexion views.    Allergies  Allergen Reactions  . Topamax [Topiramate] Other (See Comments)    CAUSED HAIR LOSS   Outpatient Medications Prior to Visit  Medication Sig Dispense Refill  . ACCU-CHEK AVIVA PLUS test strip     . amLODipine (NORVASC) 5 MG tablet Take 5 mg by mouth daily.    Marland Kitchen aspirin EC 81 MG tablet Take 81 mg by mouth daily.    Marland Kitchen atorvastatin (LIPITOR) 20 MG tablet Take 20 mg by mouth every morning.     . calcium-vitamin D (OSCAL WITH D) 500-200 MG-UNIT per tablet Take 2 tablets by mouth 2 (two) times daily. (Patient taking differently: Take 1-2 tablets by mouth daily. ) 60 tablet 1  . clopidogrel (PLAVIX) 75 MG tablet Take 1 tablet (75 mg total) by mouth daily. 90 tablet 3  . divalproex (DEPAKOTE ER) 500 MG 24 hr tablet Take 1 tablet (500 mg total) by mouth 2 (two) times daily. 60 tablet 1  . gabapentin (NEURONTIN) 300 MG capsule Take 300 mg by mouth 3 (three) times daily.     Marland Kitchen levothyroxine (SYNTHROID, LEVOTHROID) 88 MCG tablet Take 88 mcg by mouth daily before breakfast.    . Multiple Vitamins-Minerals (MULTIVITAMIN WITH MINERALS) tablet Take 1 tablet by mouth daily.     Marland Kitchen SYNTHROID 88 MCG tablet Take 1 tablet (88 mcg total) by mouth daily  before breakfast. 30 tablet 3  . tiZANidine (ZANAFLEX) 4 MG tablet Take 4 mg by mouth every 8 (eight) hours as needed for muscle spasms.      Facility-Administered Medications Prior to Visit  Medication Dose Route Frequency Provider Last Rate Last Dose  . 0.9 %  sodium chloride infusion  500 mL Intravenous Continuous Ladene Artist, MD       Past Medical History:  Diagnosis Date  . Anemia   . Arthritis    "back" (07/18/2014)  . Cancer of left breast ([place])   . Chronic back pain   . Headache   . Heart murmur    many years ago  . Hyperlipidemia   . Hypertension   . Hypothyroidism    thyroidectomy 07/18/2014  . Neuromuscular disorder ([facility])    neuropathy hands and feet  . Personal history of chemotherapy   . Personal history of radiation therapy   . Stroke ([place])   . Type II diabetes mellitus ([place])    Past Surgical History:  Procedure Laterality Date  . ABDOMINAL HYSTERECTOMY  1970's  . APPENDECTOMY    . BILATERAL OOPHORECTOMY  1980j's  . BREAST BIOPSY Left 2008  . BUNIONECTOMY Right   . CARDIAC CATHETERIZATION  02/16/2011   NL coronares, EF 65%, no AS or MR (Dr. Corliss Parish; [place])  . CARPAL TUNNEL RELEASE Bilateral   . MASTECTOMY Left 2008  .  REDUCTION MAMMAPLASTY Right   . TEE WITHOUT CARDIOVERSION N/A 02/25/2015   Procedure: TRANSESOPHAGEAL ECHOCARDIOGRAM (TEE);  Surgeon: Sueanne Margarita, MD;  Location: [facility] ENDOSCOPY;  Service: Cardiovascular;  Laterality: N/A;  . THYROIDECTOMY N/A 07/18/2014   Procedure: TOTAL THYROIDECTOMY;  Surgeon: Armandina Gemma, MD;  Location: [place];  Service: General;  Laterality: N/A;  . TONSILLECTOMY    . TOTAL THYROIDECTOMY  07/18/2014   Social History   Socioeconomic History  . Marital status: Divorced    Spouse name: Not on file  . Number of children: Not on file  . Years of education: Not on file  . Highest education level: Not on file  Occupational History  . Not on file  Social Needs  . Financial resource strain: Not on file   . Food insecurity:    Worry: Not on file    Inability: Not on file  . Transportation needs:    Medical: Not on file    Non-medical: Not on file  Tobacco Use  . Smoking status: Former Smoker    Packs/day: 0.25    Years: 5.00    Pack years: 1.25    Types: Cigarettes    Last attempt to quit: 05/24/1970    Years since quitting: 48.2  . Smokeless tobacco: Never Used  Substance and Sexual Activity  . Alcohol use: No    Comment: 07/18/2014 "quit drinking in the 1980's"  . Drug use: Not Currently    Types: Marijuana    Comment: occassionally way back when  . Sexual activity: Not on file  Lifestyle  . Physical activity:    Days per week: Not on file    Minutes per session: Not on file  . Stress: Not on file  Relationships  . Social connections:    Talks on phone: Not on file    Gets together: Not on file    Attends religious service: Not on file    Active member of club or organization: Not on file    Attends meetings of clubs or organizations: Not on file    Relationship status: Not on file  Other Topics Concern  . Not on file  Social History Narrative  . Not on file   Family History  Problem Relation Age of Onset  . Hypertension Father   . Diabetes Father   . Prostate cancer Father   . Hypertension Sister   . Diabetes Sister   . Breast cancer Maternal Aunt       Physical Exam: Telehealth visit - not performed    Assessment and Recommendations:  1.  Intermittent solid stool incontinence for 3 months.  Chronic constipation.  Begin MiraLAX once or twice daily titrated for a complete bowel movement once or twice daily for long-term management.  Adequate daily water intake Begin Kegel exercises 4 times daily for long-term management.  Patient is advised to call if symptoms not improving with this regimen.  REV in 4 - 6 weeks, ideally in person.   2.  Burning upper abdominal pain.  Rule out gastritis, ulcer, GERD.  Begin pantoprazole 40 mg daily and follow standard  antireflux measures.  EGD +/- abdominal ultrasound if symptoms not improving. REV in 4 - 6 weeks, ideally in person.    This service was provided via telehealth  The patient was located at home in a private location.  The provider was located in my office, alone.  The patient consented to this telehealth visit and is aware of possible charges for this visit.  The other person participating in this telehealth service was Marlon Pel, [place] and their role was medication, allergies and history review, medications sent, AVS instructions.  Time spent on call: 13 minutes Total time spent on call, CMA time, reviewing records and coordinating care: 28 minutes  Interactive audio and video telecommunications were attempted between this provider and patient, however failed, due to patient having technical difficulties. We continued and completed visit with audio only.

## 2018-08-24 NOTE — Telephone Encounter (Signed)
Pt called asking for you, she said that she has some updated information for you.

## 2018-08-24 NOTE — Patient Instructions (Signed)
We have printed the prescription of pantoprazole for you to send to your mail order pharmacy.   Start over the counter Miralax mixing 17 grams in 8 oz of water 1-2 x daily to titrate depending on bowel movements.  Increase your water intake daily.   We have mailed information on kegel exercises attached to this after visit summary.   Call back in 4-6 weeks if your symptoms are not better.

## 2018-09-06 DIAGNOSIS — R413 Other amnesia: Secondary | ICD-10-CM | POA: Insufficient documentation

## 2018-09-15 ENCOUNTER — Telehealth: Payer: Self-pay | Admitting: Neurology

## 2018-09-15 NOTE — Telephone Encounter (Signed)
Pt is needing a refill on her divalproex (DEPAKOTE ER) 500 MG 24 hr tablet sent to [facility]

## 2018-09-18 NOTE — Telephone Encounter (Addendum)
Patient last seen 07/07/2016. Pt had a video visit schedule with Dr. Leonie Man on 08/17/2018 and it was set up. Pt call back on 08/16/2018 and cancel stating she did not want a telephone or video visit. Pt also had an appt in 09/2017 and pt cancel via automatic system. Pt needs to have a video visit with Janett Billow NP or Dr. Leonie Man. Pt will need to contact PCP for refills until she schedules an appt with our office.

## 2018-09-18 NOTE — Telephone Encounter (Signed)
revised 

## 2018-09-18 NOTE — Telephone Encounter (Signed)
No refills pt deferred back to PCP see note below:  I called pt that she would have to contact her PCP for depakote refills. I stated she was last seen 07/07/2016 and cancel last two appts. I stated she cancel her video visit in March 2020 but cancel the same day. I advise pt I can schedule her for a video visit with JEssica NP.Pt verbalized understanding and knows to contact her PCP for refills on depakote. Also she knows that no refills can be done until she schedule a video visit.at our office.  I stated Dr. Leonie Man recommend she schedule a video visit because last seen 06/2016.Pt verbalized understanding.

## 2018-12-07 ENCOUNTER — Telehealth: Payer: Self-pay | Admitting: Neurology

## 2018-12-07 ENCOUNTER — Telehealth: Payer: Self-pay | Admitting: *Deleted

## 2018-12-07 ENCOUNTER — Ambulatory Visit: Payer: Medicare Other | Admitting: Neurology

## 2018-12-07 ENCOUNTER — Other Ambulatory Visit: Payer: Self-pay

## 2018-12-07 ENCOUNTER — Encounter: Payer: Self-pay | Admitting: Neurology

## 2018-12-07 VITALS — BP 123/79 | HR 72 | Temp 96.6°F | Ht 62.0 in | Wt 190.0 lb

## 2018-12-07 DIAGNOSIS — G3184 Mild cognitive impairment, so stated: Secondary | ICD-10-CM | POA: Diagnosis not present

## 2018-12-07 DIAGNOSIS — R413 Other amnesia: Secondary | ICD-10-CM

## 2018-12-07 NOTE — Progress Notes (Signed)
[facility] [address] [place]. [place] [address] [phone]       OFFICE CONSULT NOTE  Jeanette [name] Date of Birth:  1950-05-30 Medical Record Number:  [id number]   Referring MD: Precious Haws Reason for Referral: Memory loss  HPI: Jeanette [name] is a 68 year old pleasant African-American lady who is seen today for evaluation for memory difficulties.  History is obtained from the patient, review of electronic medical records and I personally reviewed imaging films in PACS.  She has a past medical history of breast cancer 2018 status post surgery, chemotherapy and radiation now in remission, chronic headaches, hypertension, hyperlipidemia, hypothyroidism and chronic back pain.  She states for the last 3 months or so she is noticed some memory and cognitive difficulties.  She describes that mostly her short-term memory is poor but occasionally she has even some long-term memory difficulties.  She has trouble remembering names and recent events.  She has learned to write things down.  She has not noticed any trouble in driving and has never gotten lost.  She has been able to manage all her activities of daily living and continues to live alone.  She does admit to her mood being slightly under at times but denies depression.  She is never been on treatment for depression.  She has chronic headaches and has seen me several years ago and has been taking Depakote ER 500 twice daily and feels her headaches are under control she can only occasional headaches.  She denies any stroke, seizure, significant head injury with loss of consciousness.  There is no family history of dementia or cognitive problems.  She has not had any recent brain scan or lab work for memory loss done.  She has a prior history of abnormal MRI showing transient left medial temporal diffusion hyperintensity on 02/22/2015 of unclear significance which was found to have resolved on follow-up MRI in 03/30/2015.  She  underwent extensive work-up for stroke and seizures which was all negative.  ROS:   14 system review of systems is positive for memory loss, diminished mood, depression, tiredness, fatigue and all other systems negative PMH:  Past Medical History:  Diagnosis Date   Anemia    Arthritis    "back" (07/18/2014)   Cancer of left breast ([facility])    Chronic back pain    Headache    Heart murmur    many years ago   Hyperlipidemia    Hypertension    Hypothyroidism    thyroidectomy 07/18/2014   Neuromuscular disorder ([place])    neuropathy hands and feet   Personal history of chemotherapy    Personal history of radiation therapy    Pneumonia    Stroke ([place])    Type II diabetes mellitus ([place])     Social History:  Social History   Socioeconomic History   Marital status: Divorced    Spouse name: Not on file   Number of children: 1   Years of education: Not on file   Highest education level: Some college, no degree  Occupational History   Not on file  Social Needs   Financial resource strain: Not on file   Food insecurity    Worry: Not on file    Inability: Not on file   Transportation needs    Medical: Not on file    Non-medical: Not on file  Tobacco Use   Smoking status: Former Smoker    Packs/day: 0.25    Years: 5.00  Pack years: 1.25    Types: Cigarettes    Quit date: 05/24/1970    Years since quitting: 48.5   Smokeless tobacco: Never Used  Substance and Sexual Activity   Alcohol use: No    Comment: 07/18/2014 "quit drinking in the 1980's"   Drug use: Not Currently    Types: Marijuana    Comment: occassionally way back when   Sexual activity: Not on file  Lifestyle   Physical activity    Days per week: Not on file    Minutes per session: Not on file   Stress: Not on file  Relationships   Social connections    Talks on phone: Not on file    Gets together: Not on file    Attends religious service: Not on file    Active member of club  or organization: Not on file    Attends meetings of clubs or organizations: Not on file    Relationship status: Not on file   Intimate partner violence    Fear of current or ex partner: Not on file    Emotionally abused: Not on file    Physically abused: Not on file    Forced sexual activity: Not on file  Other Topics Concern   Not on file  Social History Narrative   Lives at home alone   Right handed   Caffeine: 1 cup coffee daily    Medications:   Current Outpatient Medications on File Prior to Visit  Medication Sig Dispense Refill   ACCU-CHEK AVIVA PLUS test strip      amLODipine (NORVASC) 5 MG tablet Take 5 mg by mouth daily.     aspirin EC 81 MG tablet Take 81 mg by mouth daily.     atorvastatin (LIPITOR) 20 MG tablet Take 20 mg by mouth every morning.      calcium-vitamin D (OSCAL WITH D) 500-200 MG-UNIT per tablet Take 2 tablets by mouth 2 (two) times daily. (Patient taking differently: Take 1-2 tablets by mouth daily. ) 60 tablet 1   clopidogrel (PLAVIX) 75 MG tablet Take 1 tablet (75 mg total) by mouth daily. 90 tablet 3   divalproex (DEPAKOTE ER) 500 MG 24 hr tablet Take 1 tablet (500 mg total) by mouth 2 (two) times daily. 60 tablet 1   furosemide (LASIX) 20 MG tablet TAKE 1 TABLET BY MOUTH EVERY DAY IN THE MORNING     gabapentin (NEURONTIN) 300 MG capsule Take 300 mg by mouth 3 (three) times daily.      glimepiride (AMARYL) 2 MG tablet Take 2 mg by mouth daily with breakfast.      levothyroxine (SYNTHROID, LEVOTHROID) 88 MCG tablet Take 88 mcg by mouth daily before breakfast.     Multiple Vitamins-Minerals (MULTIVITAMIN WITH MINERALS) tablet Take 1 tablet by mouth daily.      tiZANidine (ZANAFLEX) 4 MG tablet Take 4 mg by mouth as needed.      fluticasone (FLONASE) 50 MCG/ACT nasal spray 1 spray by Each Nare route daily.     pantoprazole (PROTONIX) 40 MG tablet Take 1 tablet (40 mg total) by mouth daily. (Patient not taking: Reported on 12/07/2018) 90  tablet 3   No current facility-administered medications on file prior to visit.     Allergies:   Allergies  Allergen Reactions   Topamax [Topiramate] Other (See Comments)    CAUSED HAIR LOSS    Physical Exam General: well developed, well nourished pleasant middle-aged African-American lady, seated, in no evident distress Head: head  normocephalic and atraumatic.   Neck: supple with no carotid or supraclavicular bruits Cardiovascular: regular rate and rhythm, no murmurs Musculoskeletal: no deformity Skin:  no rash/petichiae Vascular:  Normal pulses all extremities  Neurologic Exam Mental Status: Awake and fully alert. Oriented to place and time. Recent and remote memory intact. Attention span, concentration and fund of knowledge appropriate. Mood and affect appropriate.  Mini-Mental status exam score 26/30 with deficits in orientation and recall.  Geriatric depression scale for not depressed.  Clock drawing 4/4.  Able to name 17 animals which can walk on 4 legs in 1 minute.   Cranial Nerves: Fundoscopic exam reveals sharp disc margins. Pupils equal, briskly reactive to light. Extraocular movements full without nystagmus. Visual fields full to confrontation. Hearing intact. Facial sensation intact. Face, tongue, palate moves normally and symmetrically.  Motor: Normal bulk and tone. Normal strength in all tested extremity muscles. Sensory.: intact to touch , pinprick , position and vibratory sensation.  Coordination: Rapid alternating movements normal in all extremities. Finger-to-nose and heel-to-shin performed accurately bilaterally. Gait and Station: Arises from chair without difficulty. Stance is normal. Gait demonstrates normal stride length and balance . Able to heel, toe and tandem walk without difficulty.  Reflexes: 1+ and symmetric. Toes downgoing.       ASSESSMENT: 68 year old African-American lady with subacute memory and mild cognitive difficulties likely due to mild  cognitive impairment.  She also has remote history of chronic headaches which seem to be well controlled on Depakote.     PLAN: I had a long discussion with the patient with regarding her memory loss and mild cognitive impairment and discuss plan for evaluation and treatment and answered questions.  I recommend we check memory panel labs, EEG and MRI scan of the brain with and without contrast to look for reversible etiologies.  I encouraged her to do memory compensation strategies and participate in mentally challenging activities like solving crossword puzzles, playing bridge and sudoku.  I recommend she reduce the dose of Depakote ER to 500 mg once daily as her  headaches seem a lot improved.  Greater than 50% time during this 45-minute consultation visit was spent on counseling and coordination of care about her memory loss as well as headaches and answering questions she will return for follow-up in 2 months or call earlier if necessary. Antony Contras, MD  [facility] [address] [place], [place] [address]  Phone [phone] Fax [phone]  Note: This document was prepared with digital dictation and possible smart phrase technology. Any transcriptional errors that result from this process are unintentional.

## 2018-12-07 NOTE — Telephone Encounter (Signed)
Pt no showed f/u today.

## 2018-12-07 NOTE — Patient Instructions (Signed)
I had a long discussion with the patient with regarding her memory loss and mild cognitive impairment and discuss plan for evaluation and treatment and answered questions.  I recommend we check memory panel labs, EEG and MRI scan of the brain with and without contrast to look for reversible etiologies.  I encouraged her to do memory compensation strategies and participate in mentally challenging activities like solving crossword puzzles, playing bridge and sudoku.  I recommend she reduce the dose of Depakote ER to 500 mg once daily as her  headaches seem a lot improved.  She will return for follow-up in 2 months or call earlier if necessary. Memory Compensation Strategies  1. Use "WARM" strategy.  W= write it down  A= associate it  R= repeat it  M= make a mental note  2.   You can keep a Social worker.  Use a 3-ring notebook with sections for the following: calendar, important names and phone numbers,  medications, doctors' names/phone numbers, lists/reminders, and a section to journal what you did  each day.   3.    Use a calendar to write appointments down.  4.    Write yourself a schedule for the day.  This can be placed on the calendar or in a separate section of the Memory Notebook.  Keeping a  regular schedule can help memory.  5.    Use medication organizer with sections for each day or morning/evening pills.  You may need help loading it  6.    Keep a basket, or pegboard by the door.  Place items that you need to take out with you in the basket or on the pegboard.  You may also want to  include a message board for reminders.  7.    Use sticky notes.  Place sticky notes with reminders in a place where the task is performed.  For example: " turn off the  stove" placed by the stove, "lock the door" placed on the door at eye level, " take your medications" on  the bathroom mirror or by the place where you normally take your medications.  8.    Use alarms/timers.  Use while cooking to  remind yourself to check on food or as a reminder to take your medicine, or as a  reminder to make a call, or as a reminder to perform another task, etc.

## 2018-12-07 NOTE — Telephone Encounter (Signed)
[employer] medicare order sent to [facility]. No auth they will reach out to the patient to schedule.  

## 2018-12-08 LAB — DEMENTIA PANEL
Homocysteine: 11.9 umol/L (ref 0.0–17.2)
RPR Ser Ql: NONREACTIVE
TSH: 1.82 u[IU]/mL (ref 0.450–4.500)
Vitamin B-12: 1146 pg/mL (ref 232–1245)

## 2018-12-28 ENCOUNTER — Other Ambulatory Visit: Payer: Medicare Other

## 2019-01-11 ENCOUNTER — Other Ambulatory Visit (INDEPENDENT_AMBULATORY_CARE_PROVIDER_SITE_OTHER): Payer: Medicare Other

## 2019-01-11 ENCOUNTER — Other Ambulatory Visit: Payer: Self-pay

## 2019-01-11 DIAGNOSIS — R410 Disorientation, unspecified: Secondary | ICD-10-CM

## 2019-01-15 ENCOUNTER — Other Ambulatory Visit (HOSPITAL_COMMUNITY): Payer: Self-pay | Admitting: Neurology

## 2019-01-15 DIAGNOSIS — R413 Other amnesia: Secondary | ICD-10-CM

## 2019-01-27 ENCOUNTER — Ambulatory Visit
Admission: RE | Admit: 2019-01-27 | Discharge: 2019-01-27 | Disposition: A | Discharge status: Home / Self Care | Payer: Medicare Other | Source: Ambulatory Visit | Attending: Neurology | Admitting: Neurology

## 2019-01-27 ENCOUNTER — Other Ambulatory Visit: Payer: Self-pay

## 2019-01-27 DIAGNOSIS — R413 Other amnesia: Secondary | ICD-10-CM

## 2019-01-27 MED ORDER — GADOBENATE DIMEGLUMINE 529 MG/ML IV SOLN
9.0000 mL | Freq: Once | INTRAVENOUS | Status: AC | PRN
  Administered 2019-01-27: 9 mL via INTRAVENOUS

## 2019-02-01 ENCOUNTER — Other Ambulatory Visit: Payer: Self-pay | Admitting: Family Medicine

## 2019-02-01 DIAGNOSIS — Z1231 Encounter for screening mammogram for malignant neoplasm of breast: Secondary | ICD-10-CM

## 2019-02-14 ENCOUNTER — Ambulatory Visit: Payer: Self-pay | Admitting: Neurology

## 2019-03-07 ENCOUNTER — Other Ambulatory Visit: Payer: Self-pay

## 2019-03-07 DIAGNOSIS — Z20822 Contact with and (suspected) exposure to covid-19: Secondary | ICD-10-CM

## 2019-03-08 ENCOUNTER — Telehealth: Payer: Self-pay | Admitting: *Deleted

## 2019-03-08 NOTE — Telephone Encounter (Signed)
Records faxed to [place] - release [phone]

## 2019-03-10 LAB — NOVEL CORONAVIRUS, NAA: SARS-CoV-2, NAA: NOT DETECTED

## 2019-03-20 ENCOUNTER — Ambulatory Visit
Admission: RE | Admit: 2019-03-20 | Discharge: 2019-03-20 | Disposition: A | Discharge status: Home / Self Care | Payer: Medicare Other | Source: Ambulatory Visit | Attending: Family Medicine | Admitting: Family Medicine

## 2019-03-20 ENCOUNTER — Other Ambulatory Visit: Payer: Self-pay

## 2019-03-20 DIAGNOSIS — Z1231 Encounter for screening mammogram for malignant neoplasm of breast: Secondary | ICD-10-CM

## 2019-03-23 ENCOUNTER — Encounter: Payer: Self-pay | Admitting: Pulmonary Disease

## 2019-04-02 ENCOUNTER — Encounter: Payer: Self-pay | Admitting: Pulmonary Disease

## 2019-04-02 ENCOUNTER — Other Ambulatory Visit: Payer: Self-pay

## 2019-04-02 ENCOUNTER — Ambulatory Visit (INDEPENDENT_AMBULATORY_CARE_PROVIDER_SITE_OTHER): Payer: Medicare Other | Admitting: Pulmonary Disease

## 2019-04-02 DIAGNOSIS — G4733 Obstructive sleep apnea (adult) (pediatric): Secondary | ICD-10-CM

## 2019-04-02 DIAGNOSIS — R062 Wheezing: Secondary | ICD-10-CM | POA: Diagnosis not present

## 2019-04-02 MED ORDER — ALBUTEROL SULFATE HFA 108 (90 BASE) MCG/ACT IN AERS: 2.0000 | INHALATION_SPRAY | Freq: Four times a day (QID) | RESPIRATORY_TRACT | 11 refills | Status: AC | PRN

## 2019-04-02 NOTE — Patient Instructions (Signed)
Schedule home sleep test for obstructive sleep apnea. We discussed CPAP and treatment options.  Prescription for albuterol MDI 2 puffs every 6 hours as needed for wheezing

## 2019-04-02 NOTE — Assessment & Plan Note (Addendum)
May be related to allergies or weight gain, less likely adult onset asthma or angina equivalent She does provide a childhood history of asthma Prescription for albuterol MDI 2 puffs every 6 hours as needed for wheezing -if persistent or frequent albuterol use then will pursue further testing

## 2019-04-02 NOTE — Assessment & Plan Note (Signed)
Given excessive daytime somnolence, narrow pharyngeal exam, witnessed apneas & loud snoring, obstructive sleep apnea is very likely & an overnight polysomnogram will be scheduled as a home study. The pathophysiology of obstructive sleep apnea , it's cardiovascular consequences & modes of treatment including CPAP were discused with the patient in detail & they evidenced understanding.  She is agreeable to using a CPAP if needed, does not seem to be a mouth breather so nasal mask may suffice

## 2019-04-02 NOTE — Progress Notes (Signed)
Subjective:    Patient ID: Jeanette [name], female    DOB: [birth year], 68 y.o.   MRN: [id number]  HPI  Chief Complaint  Patient presents with  . Sleep Consult    Referred by Dr. Luciana Axe for sleep apnea. Patient reports that her son has told her that she stops breathing in her sleep and snores. She reports that she does have some daytime sleepiness and occ. headaches.    68 year old diabetic, hypertensive presents for evaluation of sleep disordered breathing and wheezing Her son has OSA and he was noted loud snoring and witnessed apneas.  She reports occasional gasping episodes that awoken her up from sleep.  Epworth sleepiness score is 11 and she reports sleepiness while watching TV, lying down to rest in the afternoon was reported after lunch or sitting and reading. Bedtime is between 8 and 11 PM, she has a TV in the bedroom which stays on through the night, sleep latency can be 2 to 3 hours as she lies watching TV.  He generally likes to sleep on her back with 2 pillows, reports 1-2 nocturnal awakenings including nocturia, does not have a fixed wake up time.  If she is keeping her grandkids she may be up by 6 AM but otherwise could stay in bed as late as 9 AM.  She has gained 25 pounds in the past year which she attributes to "corona weight" -she had to stop her water aerobics.  For the past 2 months she reports wheezing and occasional chest heaviness when she walks.  She reports asthma as a child but this never bothered her as an adult.  She has a history of left breast cancer requiring mastectomy and chemoradiation in 2009.  Calcified pulmonary nodules were noted on CT 03/2014 which I reviewed and seem to be stable on follow-up chest x-ray 2016 and 2018.  I personally reviewed these films      Significant tests/ events reviewed  CT chest 03/2014 left upper lobe 13 x 12 mm calcified nodule, left hilar calcified lymph nodes   Past Medical History:  Diagnosis Date  . Anemia   .  Arthritis    "back" (07/18/2014)  . Cancer of left breast ([place])   . Chronic back pain   . Headache   . Heart murmur    many years ago  . Hyperlipidemia   . Hypertension   . Hypothyroidism    thyroidectomy 07/18/2014  . Neuromuscular disorder (HCC)    neuropathy hands and feet  . Personal history of chemotherapy   . Personal history of radiation therapy   . Pneumonia   . Stroke ([place])   . Type II diabetes mellitus ([place])    Past Surgical History:  Procedure Laterality Date  . ABDOMINAL HYSTERECTOMY  1970's  . APPENDECTOMY    . BILATERAL OOPHORECTOMY  1980j's  . BREAST BIOPSY Left 2008  . BUNIONECTOMY Right   . CARDIAC CATHETERIZATION  02/16/2011   NL coronares, EF 65%, no AS or MR (Dr. Corliss Parish; [place])  . CARPAL TUNNEL RELEASE Bilateral   . MASTECTOMY Left 2008  . REDUCTION MAMMAPLASTY Right   . TEE WITHOUT CARDIOVERSION N/A 02/25/2015   Procedure: TRANSESOPHAGEAL ECHOCARDIOGRAM (TEE);  Surgeon: Sueanne Margarita, MD;  Location: [facility] ENDOSCOPY;  Service: Cardiovascular;  Laterality: N/A;  . THYROIDECTOMY N/A 07/18/2014   Procedure: TOTAL THYROIDECTOMY;  Surgeon: Armandina Gemma, MD;  Location: [facility];  Service: General;  Laterality: N/A;  . TONSILLECTOMY    . TOTAL  THYROIDECTOMY  07/18/2014    Allergies  Allergen Reactions  . Topamax [Topiramate] Other (See Comments)    CAUSED HAIR LOSS    Social History   Socioeconomic History  . Marital status: Divorced    Spouse name: Not on file  . Number of children: 1  . Years of education: Not on file  . Highest education level: Some college, no degree  Occupational History  . Not on file  Social Needs  . Financial resource strain: Not on file  . Food insecurity    Worry: Not on file    Inability: Not on file  . Transportation needs    Medical: Not on file    Non-medical: Not on file  Tobacco Use  . Smoking status: Former Smoker    Packs/day: 0.25    Years: 5.00    Pack years: 1.25    Types: Cigarettes    Quit  date: 05/24/1970    Years since quitting: 48.8  . Smokeless tobacco: Never Used  Substance and Sexual Activity  . Alcohol use: No    Comment: 07/18/2014 "quit drinking in the 1980's"  . Drug use: Not Currently    Types: Marijuana    Comment: occassionally way back when  . Sexual activity: Not on file  Lifestyle  . Physical activity    Days per week: Not on file    Minutes per session: Not on file  . Stress: Not on file  Relationships  . Social Herbalist on phone: Not on file    Gets together: Not on file    Attends religious service: Not on file    Active member of club or organization: Not on file    Attends meetings of clubs or organizations: Not on file    Relationship status: Not on file  . Intimate partner violence    Fear of current or ex partner: Not on file    Emotionally abused: Not on file    Physically abused: Not on file    Forced sexual activity: Not on file  Other Topics Concern  . Not on file  Social History Narrative   Lives at home alone   Right handed   Caffeine: 1 cup coffee daily    Family History  Problem Relation Age of Onset  . Hypertension Father   . Diabetes Father   . Prostate cancer Father   . Hypertension Sister   . Diabetes Sister   . Breast cancer Maternal Aunt      Review of Systems  Respiratory: Positive for apnea, cough and shortness of breath.   Neurological: Positive for headaches.   Constitutional: negative for anorexia, fevers and sweats  Eyes: negative for irritation, redness and visual disturbance  Ears, nose, mouth, throat, and face: negative for earaches, epistaxis, nasal congestion and sore throat  Respiratory: negative for cough,  sputum  Cardiovascular: negative for chest pain, dyspnea, lower extremity edema, orthopnea, palpitations and syncope  Gastrointestinal: negative for abdominal pain, constipation, diarrhea, melena, nausea and vomiting  Genitourinary:negative for dysuria, frequency and hematuria   Hematologic/lymphatic: negative for bleeding, easy bruising and lymphadenopathy  Musculoskeletal:negative for arthralgias, muscle weakness and stiff joints  Neurological: negative for coordination problems, gait problems and weakness  Endocrine: negative for diabetic symptoms including polydipsia, polyuria and weight loss     Objective:   Physical Exam  Gen. Pleasant, obese, in no distress, normal affect ENT - no pallor,icterus, no post nasal drip, class 2-3 airway Neck: No JVD, no  thyromegaly, no carotid bruits Lungs: no use of accessory muscles, no dullness to percussion, decreased without rales or rhonchi  Cardiovascular: Rhythm regular, heart sounds  normal, no murmurs or gallops, no peripheral edema Abdomen: soft and non-tender, no hepatosplenomegaly, BS normal. Musculoskeletal: No deformities, no cyanosis or clubbing Neuro:  alert, non focal, no tremors        Assessment & Plan:

## 2019-04-10 ENCOUNTER — Other Ambulatory Visit: Payer: Self-pay

## 2019-04-10 ENCOUNTER — Encounter: Payer: Self-pay | Admitting: Neurology

## 2019-04-10 ENCOUNTER — Ambulatory Visit: Payer: Medicare Other | Admitting: Neurology

## 2019-04-10 VITALS — BP 124/75 | HR 70 | Temp 96.9°F | Ht 62.0 in | Wt 199.0 lb

## 2019-04-10 DIAGNOSIS — G3184 Mild cognitive impairment, so stated: Secondary | ICD-10-CM | POA: Diagnosis not present

## 2019-04-10 MED ORDER — PREVAGEN 10 MG PO CAPS: 1.0000 | ORAL_CAPSULE | Freq: Every morning | ORAL | 0 refills | Status: DC

## 2019-04-10 NOTE — Progress Notes (Signed)
[facility] [address] [place]. [place] [address] [phone]       OFFICE FOLLOW UP VISIT NOTE  Jeanette [name] Date of Birth:  January 02, 1951 Medical Record Number:  [id number]   Referring MD: Precious Haws Reason for Referral: Memory loss  [id number] visit 12/07/2018 : Jeanette [name] is a 68 year old pleasant African-American lady who is seen today for evaluation for memory difficulties.  History is obtained from the patient, review of electronic medical records and I personally reviewed imaging films in PACS.  She has a past medical history of breast cancer 2018 status post surgery, chemotherapy and radiation now in remission, chronic headaches, hypertension, hyperlipidemia, hypothyroidism and chronic back pain.  She states for the last 3 months or so she is noticed some memory and cognitive difficulties.  She describes that mostly her short-term memory is poor but occasionally she has even some long-term memory difficulties.  She has trouble remembering names and recent events.  She has learned to write things down.  She has not noticed any trouble in driving and has never gotten lost.  She has been able to manage all her activities of daily living and continues to live alone.  She does admit to her mood being slightly under at times but denies depression.  She is never been on treatment for depression.  She has chronic headaches and has seen me several years ago and has been taking Depakote ER 500 twice daily and feels her headaches are under control she can only occasional headaches.  She denies any stroke, seizure, significant head injury with loss of consciousness.  There is no family history of dementia or cognitive problems.  She has not had any recent brain scan or lab work for memory loss done.  She has a prior history of abnormal MRI showing transient left medial temporal diffusion hyperintensity on 02/22/2015 of unclear significance which was found to have resolved on  follow-up MRI in 03/30/2015.  She underwent extensive work-up for stroke and seizures which was all negative. Update 04/10/2019.:  She returns for follow-up after last visit 4 months ago.  She states her subjective memory difficulties appear more or less unchanged.  She denies significant worsening of her symptoms or ability to do activities of daily living for herself.  She mostly struggles with recent information and short-term memory but long-term memory appears to be preserved.  She did undergo lab work for reversible causes of memory loss and 12/07/2018 and vitamin B12, TSH, RPR and homocystine were all normal.  EEG done on 01/15/2019 was normal.  MRI scan of the brain on 01/27/2019 was also normal.  She has not been participating regularly in cognitively challenging activities.  She has not been quite physically active either.  She has heard about provision over-the-counter and would like to try it.  She continues to have headaches but they are not as severe and occur about 3 or 4 times a week.  She is currently on Depakote ER 500 twice daily and it seems to help her and she is tolerating it well without any side effects.  She has no new complaints today. ROS:   14 system review of systems is positive for memory loss,  tiredness, fatigue and all other systems negative PMH:  Past Medical History:  Diagnosis Date   Anemia    Arthritis    "back" (07/18/2014)   Cancer of left breast (HCC)    Chronic back pain    Headache    Heart murmur  many years ago   Hyperlipidemia    Hypertension    Hypothyroidism    thyroidectomy 07/18/2014   Neuromuscular disorder ([place])    neuropathy hands and feet   Personal history of chemotherapy    Personal history of radiation therapy    Pneumonia    Stroke ([place])    Type II diabetes mellitus ([place])     Social History:  Social History   Socioeconomic History   Marital status: Divorced    Spouse name: Not on file   Number of children: 1    Years of education: Not on file   Highest education level: Some college, no degree  Occupational History   Not on file  Social Needs   Financial resource strain: Not on file   Food insecurity    Worry: Not on file    Inability: Not on file   Transportation needs    Medical: Not on file    Non-medical: Not on file  Tobacco Use   Smoking status: Former Smoker    Packs/day: 0.25    Years: 5.00    Pack years: 1.25    Types: Cigarettes    Quit date: 05/24/1970    Years since quitting: 48.9   Smokeless tobacco: Never Used  Substance and Sexual Activity   Alcohol use: No    Comment: 07/18/2014 "quit drinking in the 1980's"   Drug use: Not Currently    Types: Marijuana    Comment: occassionally way back when   Sexual activity: Not on file  Lifestyle   Physical activity    Days per week: Not on file    Minutes per session: Not on file   Stress: Not on file  Relationships   Social connections    Talks on phone: Not on file    Gets together: Not on file    Attends religious service: Not on file    Active member of club or organization: Not on file    Attends meetings of clubs or organizations: Not on file    Relationship status: Not on file   Intimate partner violence    Fear of current or ex partner: Not on file    Emotionally abused: Not on file    Physically abused: Not on file    Forced sexual activity: Not on file  Other Topics Concern   Not on file  Social History Narrative   Lives at home alone   Right handed   Caffeine: 1 cup coffee daily    Medications:   Current Outpatient Medications on File Prior to Visit  Medication Sig Dispense Refill   ACCU-CHEK AVIVA PLUS test strip      albuterol (VENTOLIN HFA) 108 (90 Base) MCG/ACT inhaler Inhale 2 puffs into the lungs every 6 (six) hours as needed for wheezing or shortness of breath. 18 g 11   amLODipine (NORVASC) 5 MG tablet Take 5 mg by mouth daily.     aspirin EC 81 MG tablet Take 81 mg by mouth  daily.     atorvastatin (LIPITOR) 20 MG tablet Take 20 mg by mouth every morning.      calcium-vitamin D (OSCAL WITH D) 500-200 MG-UNIT per tablet Take 2 tablets by mouth 2 (two) times daily. (Patient taking differently: Take 1-2 tablets by mouth daily. ) 60 tablet 1   clopidogrel (PLAVIX) 75 MG tablet Take 1 tablet (75 mg total) by mouth daily. 90 tablet 3   divalproex (DEPAKOTE ER) 500 MG 24 hr tablet Take 1  tablet (500 mg total) by mouth 2 (two) times daily. (Patient taking differently: Take 500 mg by mouth daily. ) 60 tablet 1   fluticasone (FLONASE) 50 MCG/ACT nasal spray 1 spray by Each Nare route daily.     furosemide (LASIX) 20 MG tablet TAKE 1 TABLET BY MOUTH EVERY DAY IN THE MORNING     gabapentin (NEURONTIN) 300 MG capsule Take 300 mg by mouth 3 (three) times daily.      glimepiride (AMARYL) 2 MG tablet Take 2 mg by mouth daily with breakfast.      levothyroxine (SYNTHROID, LEVOTHROID) 88 MCG tablet Take 88 mcg by mouth daily before breakfast.     Multiple Vitamins-Minerals (MULTIVITAMIN WITH MINERALS) tablet Take 1 tablet by mouth daily.      pantoprazole (PROTONIX) 40 MG tablet Take 1 tablet (40 mg total) by mouth daily. 90 tablet 3   tiZANidine (ZANAFLEX) 4 MG tablet Take 4 mg by mouth as needed.      No current facility-administered medications on file prior to visit.     Allergies:   Allergies  Allergen Reactions   Topamax [Topiramate] Other (See Comments)    CAUSED HAIR LOSS    Physical Exam General: well developed, well nourished pleasant middle-aged African-American lady, seated, in no evident distress Head: head normocephalic and atraumatic.   Neck: supple with no carotid or supraclavicular bruits Cardiovascular: regular rate and rhythm, no murmurs Musculoskeletal: no deformity Skin:  no rash/petichiae Vascular:  Normal pulses all extremities  Neurologic Exam Mental Status: Awake and fully alert. Oriented to place and time. Recent and remote memory  intact. Attention span, concentration and fund of knowledge appropriate. Mood and affect appropriate.  Mini-Mental status exam not done.  Recall 3/3.  Able to name 13 animals which can walk on 4 legs.  Clock drawing 4/4.     Cranial Nerves: Fundoscopic exam not done. Pupils equal, briskly reactive to light. Extraocular movements full without nystagmus. Visual fields full to confrontation. Hearing intact. Facial sensation intact. Face, tongue, palate moves normally and symmetrically.  Motor: Normal bulk and tone. Normal strength in all tested extremity muscles. Sensory.: intact to touch , pinprick , position and vibratory sensation.  Coordination: Rapid alternating movements normal in all extremities. Finger-to-nose and heel-to-shin performed accurately bilaterally. Gait and Station: Arises from chair without difficulty. Stance is normal. Gait demonstrates normal stride length and balance . Able to heel, toe and tandem walk without difficulty.  Reflexes: 1+ and symmetric. Toes downgoing.       ASSESSMENT: 68 year old African-American lady with subacute memory and mild cognitive difficulties likely due to mild cognitive impairment.  She also has remote history of chronic headaches which seem to be well controlled on Depakote.     PLAN: I had a long discussion with the patient regarding her memory loss and mild cognitive impairment as well as headaches all of which appear to be quite stable.  I recommend she start taking provision 10 mg capsule daily as well as increase participation in cognitively challenging activities like solving crossword puzzle, playing bridge and sudoku.  We also discussed memory compensation strategies.  Continue Depakote ER 500 mg twice daily for her headaches which seemed well controlled.  She will return for follow-up in the future in 6 months with my nurse practitioner Janett Billow or call earlier if necessary.  Greater than 50% time during this 25-minutevisit was spent on  counseling and coordination of care about her memory loss as well as headaches and answering questions  .  Antony Contras, MD  [facility] [address] [place], [place] [address]  Phone [phone] Fax [phone]  Note: This document was prepared with digital dictation and possible smart phrase technology. Any transcriptional errors that result from this process are unintentional.

## 2019-04-10 NOTE — Patient Instructions (Signed)
I had a long discussion with the patient regarding her memory loss and mild cognitive impairment as well as headaches all of which appear to be quite stable.  I recommend she start taking provision 10 mg capsule daily as well as increase participation in cognitively challenging activities like solving crossword puzzle, playing bridge and sudoku.  We also discussed memory compensation strategies.  Continue Depakote ER 500 mg twice daily for her headaches which seemed well controlled.  She will return for follow-up in the future in 6 months with my nurse practitioner Janett Billow or call earlier if necessary. Memory Compensation Strategies  1. Use "WARM" strategy.  W= write it down  A= associate it  R= repeat it  M= make a mental note  2.   You can keep a Social worker.  Use a 3-ring notebook with sections for the following: calendar, important names and phone numbers,  medications, doctors' names/phone numbers, lists/reminders, and a section to journal what you did  each day.   3.    Use a calendar to write appointments down.  4.    Write yourself a schedule for the day.  This can be placed on the calendar or in a separate section of the Memory Notebook.  Keeping a  regular schedule can help memory.  5.    Use medication organizer with sections for each day or morning/evening pills.  You may need help loading it  6.    Keep a basket, or pegboard by the door.  Place items that you need to take out with you in the basket or on the pegboard.  You may also want to  include a message board for reminders.  7.    Use sticky notes.  Place sticky notes with reminders in a place where the task is performed.  For example: " turn off the  stove" placed by the stove, "lock the door" placed on the door at eye level, " take your medications" on  the bathroom mirror or by the place where you normally take your medications.  8.    Use alarms/timers.  Use while cooking to remind yourself to check on food or as  a reminder to take your medicine, or as a  reminder to make a call, or as a reminder to perform another task, etc.

## 2019-05-14 ENCOUNTER — Other Ambulatory Visit: Payer: Self-pay

## 2019-05-14 ENCOUNTER — Ambulatory Visit: Payer: Medicare Other

## 2019-05-14 DIAGNOSIS — G4733 Obstructive sleep apnea (adult) (pediatric): Secondary | ICD-10-CM

## 2019-05-16 ENCOUNTER — Telehealth: Payer: Self-pay | Admitting: Pulmonary Disease

## 2019-05-16 DIAGNOSIS — G4733 Obstructive sleep apnea (adult) (pediatric): Secondary | ICD-10-CM

## 2019-05-16 NOTE — Telephone Encounter (Signed)
Home sleep test showed moderate OSA AHI 12/hour, but probably overestimated total sleep time  Suggest auto CPAP 5 to 15 cm, nasal pillows, office visit with me/APP in 6 weeks

## 2019-05-16 NOTE — Telephone Encounter (Signed)
Called the patient and made her aware of the results. Patient voiced understanding. Has not used DME before, order for the CPAP sent to [facility].  Patient scheduled for CPAP follow up on 07/02/19 with Wyn Quaker, NP at 9:00. Nothing further needed at this time.

## 2019-05-17 DIAGNOSIS — G4733 Obstructive sleep apnea (adult) (pediatric): Secondary | ICD-10-CM | POA: Diagnosis not present

## 2019-06-11 ENCOUNTER — Telehealth: Payer: Self-pay | Admitting: Hematology and Oncology

## 2019-06-11 NOTE — Telephone Encounter (Signed)
Returned patient's phone call regarding rescheduling 01/20 appointment, per patient's request appointment has moved to 02/17.

## 2019-06-13 ENCOUNTER — Inpatient Hospital Stay: Payer: Medicare Other | Admitting: Hematology and Oncology

## 2019-06-29 ENCOUNTER — Telehealth: Payer: Self-pay | Admitting: Pulmonary Disease

## 2019-06-29 NOTE — Telephone Encounter (Signed)
Patient was scheduled for a CPAP follow up with Aaron Edelman on 07/02/19 at [facility] and spoke with Estill Bamberg. She stated that the patient will receive her CPAP on 07/09/19.   Called patient to see if she would like to reschedule. She has been rescheduled for April 22nd at 930am. She verbalized understanding. She did state that her insurance had changed to [facility] and wanted to know what her copay would be. Advised her that based on the info in her chart, her copay would be $35. She verbalized understanding.   Nothing further needed at time of call.

## 2019-07-02 ENCOUNTER — Ambulatory Visit: Payer: Medicare Other | Admitting: Pulmonary Disease

## 2019-07-11 ENCOUNTER — Telehealth: Payer: Self-pay | Admitting: Hematology and Oncology

## 2019-07-11 ENCOUNTER — Inpatient Hospital Stay: Payer: Medicare HMO | Admitting: Hematology and Oncology

## 2019-07-11 NOTE — Progress Notes (Signed)
HEMATOLOGY-ONCOLOGY MYCHART VIDEO VISIT PROGRESS NOTE  I connected with Jeanette [name] on 07/12/2019 at  3:15 PM EST by MyChart video conference and verified that I am speaking with the correct person using two identifiers.  I discussed the limitations, risks, security and privacy concerns of performing an evaluation and management service by MyChart and the availability of in person appointments.  I also discussed with the patient that there may be a patient responsible charge related to this service. The patient expressed understanding and agreed to proceed.  Patient's Location: Home Physician Location: Clinic  Patient could not join on the video conference.  We performed a telephone visit.  CHIEF COMPLIANT: Surveillance of breast cancer  INTERVAL HISTORY: Jeanette [name] is a 69 y.o. female with above-mentioned history of left breast cancer treated with mastectomy, adjuvant chemotherapy, radiation, and 6 years of antiestrogen therapy with anastrozole. She is currently on surveillance. Mammogram on 03/20/19 showed no evidence of malignancy in the right breast. She presents over MyChart today for annual follow-up.  Occ cramps in breast.  Oncology History  Breast cancer of upper-outer quadrant of left female breast ([place])  03/23/2007 Initial Diagnosis   Breast cancer of upper-outer quadrant of left female breast   05/09/2007 Surgery   Left mastectomy T2, N1, M0 stage IIB ER/PR positive HER-2 negative (based on patient's description.- no records available)   06/13/2007 - 08/02/2007 Chemotherapy   Taxotere Cytoxan x4   09/21/2007 - 11/01/2007 Radiation Therapy   Adjuvant radiation therapy   11/28/2007 - 03/22/2014 Anti-estrogen oral therapy   Arimidex 1 mg daily   07/18/2014 Surgery   total thyroidectomy: Benign multinodular goiter    Observations/Objective:  There were no vitals filed for this visit. There is no height or weight on file to calculate BMI.  I have reviewed the data  as listed CMP Latest Ref Rng & Units 05/04/2017 05/04/2017 02/22/2015  Glucose 65 - 99 mg/dL 127(H) 104(H) 94  BUN 6 - 20 mg/dL '12 9 14  '$ Creatinine 0.44 - 1.00 mg/dL 1.19(H) 1.00 1.22(H)  Sodium 135 - 145 mmol/L 138 145 140  Potassium 3.5 - 5.1 mmol/L 3.7 3.7 5.0  Chloride 101 - 111 mmol/L 104 105 107  CO2 22 - 32 mmol/L 26 - 25  Calcium 8.9 - 10.3 mg/dL 9.3 - 9.4  Total Protein 6.4 - 8.3 g/dL - - -  Total Bilirubin 0.20 - 1.20 mg/dL - - -  Alkaline Phos 40 - 150 U/L - - -  AST 5 - 34 U/L - - -  ALT 0 - 55 U/L - - -    Lab Results  Component Value Date   WBC 6.8 05/04/2017   HGB 13.2 05/04/2017   HCT 40.6 05/04/2017   MCV 83.4 05/04/2017   PLT 227 05/04/2017   NEUTROABS 2.2 05/04/2017      Assessment Plan:  Breast cancer of upper-outer quadrant of left female breast Left breast cancer status post left mastectomy December 2000 and T2, N1, M0 stage IIB ER/PR positive HER-2 negative followed by adjuvant chemotherapy and radiation started Arimidex July 2009 and completed 03/22/2014  Breast Cancer Surveillance: 1. Breast exam  2. Mammogram right breast: 03/20/2019: Benign breast density category B.  Follow-up once a year for follow-up    I discussed the assessment and treatment plan with the patient. The patient was provided an opportunity to ask questions and all were answered. The patient agreed with the plan and demonstrated an understanding of the instructions. The patient was advised  to call back or seek an in-person evaluation if the symptoms worsen or if the condition fails to improve as anticipated.   I provided 20 minutes of face-to-face MyChart video visit time during this encounter.    Rulon Eisenmenger, MD 07/12/2019   I, Molly Dorshimer, am acting as scribe for Nicholas Lose, MD.  I have reviewed the above documentation for accuracy and completeness, and I agree with the above.

## 2019-07-11 NOTE — Telephone Encounter (Signed)
Reschedule per 2/15 sch msg, pt req. Called and spoke with pt, confirmed 2/18 appt

## 2019-07-11 NOTE — Assessment & Plan Note (Deleted)
Left breast cancer status post left mastectomy December 2000 and T2, N1, M0 stage IIB ER/PR positive HER-2 negative followed by adjuvant chemotherapy and radiation started Arimidex July 2009 and completed 03/22/2014  Breast Cancer Surveillance: 1. Breast exam  07/11/2019: Benign 2. Mammogram right breast:  03/20/2019: Benign breast density category B.   Follow-up once a year for follow-up

## 2019-07-12 ENCOUNTER — Telehealth (HOSPITAL_BASED_OUTPATIENT_CLINIC_OR_DEPARTMENT_OTHER): Payer: Medicare HMO | Admitting: Hematology and Oncology

## 2019-07-12 ENCOUNTER — Telehealth: Payer: Medicare HMO | Admitting: Hematology and Oncology

## 2019-07-12 DIAGNOSIS — C50412 Malignant neoplasm of upper-outer quadrant of left female breast: Secondary | ICD-10-CM

## 2019-07-12 DIAGNOSIS — Z170 Estrogen receptor positive status [ER+]: Secondary | ICD-10-CM

## 2019-07-12 NOTE — Assessment & Plan Note (Signed)
Left breast cancer status post left mastectomy December 2000 and T2, N1, M0 stage IIB ER/PR positive HER-2 negative followed by adjuvant chemotherapy and radiation started Arimidex July 2009 and completed 03/22/2014  Breast Cancer Surveillance: 1. Breast exam Benign 2. Mammogram right breast:  03/20/2019: Benign breast density category B.   Follow-up once a year for follow-up

## 2019-07-14 ENCOUNTER — Other Ambulatory Visit: Payer: Self-pay

## 2019-07-14 ENCOUNTER — Emergency Department (HOSPITAL_COMMUNITY)
Admission: EM | Admit: 2019-07-14 | Discharge: 2019-07-15 | Disposition: A | Discharge status: Home / Self Care | Payer: Medicare HMO | Attending: Emergency Medicine | Admitting: Emergency Medicine

## 2019-07-14 ENCOUNTER — Encounter (HOSPITAL_COMMUNITY): Payer: Self-pay | Admitting: Emergency Medicine

## 2019-07-14 DIAGNOSIS — R519 Headache, unspecified: Secondary | ICD-10-CM | POA: Diagnosis present

## 2019-07-14 DIAGNOSIS — Z7902 Long term (current) use of antithrombotics/antiplatelets: Secondary | ICD-10-CM | POA: Insufficient documentation

## 2019-07-14 DIAGNOSIS — Z87891 Personal history of nicotine dependence: Secondary | ICD-10-CM | POA: Insufficient documentation

## 2019-07-14 DIAGNOSIS — G43109 Migraine with aura, not intractable, without status migrainosus: Secondary | ICD-10-CM

## 2019-07-14 DIAGNOSIS — I10 Essential (primary) hypertension: Secondary | ICD-10-CM | POA: Insufficient documentation

## 2019-07-14 DIAGNOSIS — G43909 Migraine, unspecified, not intractable, without status migrainosus: Secondary | ICD-10-CM | POA: Insufficient documentation

## 2019-07-14 DIAGNOSIS — E039 Hypothyroidism, unspecified: Secondary | ICD-10-CM | POA: Insufficient documentation

## 2019-07-14 DIAGNOSIS — Z79899 Other long term (current) drug therapy: Secondary | ICD-10-CM | POA: Diagnosis not present

## 2019-07-14 DIAGNOSIS — E119 Type 2 diabetes mellitus without complications: Secondary | ICD-10-CM | POA: Diagnosis not present

## 2019-07-14 DIAGNOSIS — Z7984 Long term (current) use of oral hypoglycemic drugs: Secondary | ICD-10-CM | POA: Insufficient documentation

## 2019-07-14 DIAGNOSIS — Z7982 Long term (current) use of aspirin: Secondary | ICD-10-CM | POA: Diagnosis not present

## 2019-07-14 NOTE — ED Triage Notes (Signed)
Pt from home c/o headache X2 weeks.  Has hx of same but states she is unable to "get rid of it" and the intensity is greater.  She also states that "my neck is popping when I try to move it."  No neuro deficits at this time

## 2019-07-15 ENCOUNTER — Emergency Department (HOSPITAL_COMMUNITY): Payer: Medicare HMO

## 2019-07-15 LAB — CBC WITH DIFFERENTIAL/PLATELET
Abs Immature Granulocytes: 0.02 10*3/uL (ref 0.00–0.07)
Basophils Absolute: 0 10*3/uL (ref 0.0–0.1)
Basophils Relative: 1 %
Eosinophils Absolute: 0.1 10*3/uL (ref 0.0–0.5)
Eosinophils Relative: 2 %
HCT: 40.8 % (ref 36.0–46.0)
Hemoglobin: 12.6 g/dL (ref 12.0–15.0)
Immature Granulocytes: 0 %
Lymphocytes Relative: 46 %
Lymphs Abs: 3.2 10*3/uL (ref 0.7–4.0)
MCH: 27 pg (ref 26.0–34.0)
MCHC: 30.9 g/dL (ref 30.0–36.0)
MCV: 87.4 fL (ref 80.0–100.0)
Monocytes Absolute: 0.6 10*3/uL (ref 0.1–1.0)
Monocytes Relative: 9 %
Neutro Abs: 2.8 10*3/uL (ref 1.7–7.7)
Neutrophils Relative %: 42 %
Platelets: 250 10*3/uL (ref 150–400)
RBC: 4.67 MIL/uL (ref 3.87–5.11)
RDW: 14.9 % (ref 11.5–15.5)
WBC: 6.8 10*3/uL (ref 4.0–10.5)
nRBC: 0 % (ref 0.0–0.2)

## 2019-07-15 LAB — URINALYSIS, ROUTINE W REFLEX MICROSCOPIC
Bilirubin Urine: NEGATIVE
Glucose, UA: NEGATIVE mg/dL
Hgb urine dipstick: NEGATIVE
Ketones, ur: NEGATIVE mg/dL
Leukocytes,Ua: NEGATIVE
Nitrite: NEGATIVE
Protein, ur: NEGATIVE mg/dL
Specific Gravity, Urine: 1.005 (ref 1.005–1.030)
pH: 7 (ref 5.0–8.0)

## 2019-07-15 LAB — BASIC METABOLIC PANEL
Anion gap: 7 (ref 5–15)
BUN: 24 mg/dL — ABNORMAL HIGH (ref 8–23)
CO2: 27 mmol/L (ref 22–32)
Calcium: 8.8 mg/dL — ABNORMAL LOW (ref 8.9–10.3)
Chloride: 107 mmol/L (ref 98–111)
Creatinine, Ser: 1.63 mg/dL — ABNORMAL HIGH (ref 0.44–1.00)
GFR calc Af Amer: 37 mL/min — ABNORMAL LOW (ref >60)
GFR calc non Af Amer: 32 mL/min — ABNORMAL LOW (ref >60)
Glucose, Bld: 73 mg/dL (ref 70–99)
Potassium: 4.5 mmol/L (ref 3.5–5.1)
Sodium: 141 mmol/L (ref 135–145)

## 2019-07-15 LAB — VALPROIC ACID LEVEL: Valproic Acid Lvl: 43 ug/mL — ABNORMAL LOW (ref 50.0–100.0)

## 2019-07-15 MED ORDER — DIPHENHYDRAMINE HCL 50 MG/ML IJ SOLN
25.0000 mg | Freq: Once | INTRAMUSCULAR | Status: AC
  Administered 2019-07-15: 25 mg via INTRAVENOUS
  Filled 2019-07-15: qty 1

## 2019-07-15 MED ORDER — SODIUM CHLORIDE 0.9 % IV BOLUS (SEPSIS)
1000.0000 mL | Freq: Once | INTRAVENOUS | Status: AC
  Administered 2019-07-15: 1000 mL via INTRAVENOUS

## 2019-07-15 MED ORDER — METOCLOPRAMIDE HCL 5 MG/ML IJ SOLN
10.0000 mg | Freq: Once | INTRAMUSCULAR | Status: AC
  Administered 2019-07-15: 10 mg via INTRAVENOUS
  Filled 2019-07-15: qty 2

## 2019-07-15 NOTE — ED Notes (Signed)
EKG completed

## 2019-07-15 NOTE — ED Notes (Signed)
Patient verbalizes understanding of discharge instructions. Opportunity for questioning and answers were provided. All questions answered completely. PIV removed, catheter intact. Site dressed with gauze and tape. Armband removed by staff, pt discharged from ED. Ambulatory with strong, steady gait

## 2019-07-15 NOTE — Discharge Instructions (Addendum)
Your labs, urine, EKG and MRI today were reassuring.  No sign of stroke.  You are likely having complicated migraine headaches.  I recommend close follow-up with your neurologist as an outpatient.

## 2019-07-15 NOTE — ED Notes (Addendum)
PIV initiated, 20 G to RAC. IV flushes with 10 cc NS without s/s of infiltration. Positive blood return noted. Secured with tape and tegaderm. Labs drawn, labeled with 2 pt identifiers, and sent to lab Pt ambulatory to and from BR with strong, steady gait. Urine collected, labeled with 2 pt identifiers, and sent to lab

## 2019-07-15 NOTE — ED Notes (Signed)
Pt taken to MRI in NAD

## 2019-07-15 NOTE — ED Notes (Signed)
All meds given per MAR. Name/DOB verified with pt 

## 2019-07-15 NOTE — ED Provider Notes (Signed)
TIME SEEN: 12:02 AM  CHIEF COMPLAINT: Headache, intermittent right upper extremity numbness and weakness, balance issues  HPI: Patient is a 69 year old female with history of hypertension, hyperlipidemia, diabetes, previous stroke who presents to the emergency department with complaints of frontal headache.  States this is ongoing for 2 weeks and has not resolved.  Also reports intermittent right upper extremity numbness and weakness but none currently.  Also feels like she has had issues with balance over the past 2 weeks.  States she had similar symptoms in 2016 and was told she had a stroke.  She is on Plavix.  No head injury.  No neck pain or neck stiffness.  No current numbness or weakness.  No fevers, chest pain or shortness of breath.  No vomiting or diarrhea.  No aggravating or alleviating factors.  Reports that she was given a "cocktail" which helped her headache previously.  Has seen Dr. Leonie Man with [facility] neurology in the past.  ROS: See HPI Constitutional: no fever  Eyes: no drainage  ENT: no runny nose   Cardiovascular:  no chest pain  Resp: no SOB  GI: no vomiting GU: no dysuria Integumentary: no rash  Allergy: no hives  Musculoskeletal: no leg swelling  Neurological: no slurred speech ROS otherwise negative  PAST MEDICAL HISTORY/PAST SURGICAL HISTORY:  Past Medical History:  Diagnosis Date  . Anemia   . Arthritis    "back" (07/18/2014)  . Cancer of left breast ([place])   . Chronic back pain   . Headache   . Heart murmur    many years ago  . Hyperlipidemia   . Hypertension   . Hypothyroidism    thyroidectomy 07/18/2014  . Neuromuscular disorder ([facility])    neuropathy hands and feet  . Personal history of chemotherapy   . Personal history of radiation therapy   . Pneumonia   . Stroke ([place])   . Type II diabetes mellitus ([facility])     MEDICATIONS:  Prior to Admission medications   Medication Sig Start Date End Date Taking? Authorizing Provider  ACCU-CHEK AVIVA PLUS test  strip  02/27/15   [provider]  albuterol (VENTOLIN HFA) 108 (90 Base) MCG/ACT inhaler Inhale 2 puffs into the lungs every 6 (six) hours as needed for wheezing or shortness of breath. 04/02/19   Rigoberto Noel, MD  amLODipine (NORVASC) 5 MG tablet Take 5 mg by mouth daily. 04/20/17   [provider]  Apoaequorin (PREVAGEN) 10 MG CAPS Take 1 capsule by mouth every morning. 04/10/19   Garvin Fila, MD  aspirin EC 81 MG tablet Take 81 mg by mouth daily.    [provider]  atorvastatin (LIPITOR) 20 MG tablet Take 20 mg by mouth every morning.  02/16/11   [provider]  calcium-vitamin D (OSCAL WITH D) 500-200 MG-UNIT per tablet Take 2 tablets by mouth 2 (two) times daily. Patient taking differently: Take 1-2 tablets by mouth daily.  07/19/14   Armandina Gemma, MD  clopidogrel (PLAVIX) 75 MG tablet Take 1 tablet (75 mg total) by mouth daily. 04/02/15   Garvin Fila, MD  divalproex (DEPAKOTE ER) 500 MG 24 hr tablet Take 1 tablet (500 mg total) by mouth 2 (two) times daily. Patient taking differently: Take 500 mg by mouth daily.  05/04/17   Garvin Fila, MD  fluticasone (FLONASE) 50 MCG/ACT nasal spray 1 spray by Each Nare route daily. 07/17/18   [provider]  furosemide (LASIX) 20 MG tablet TAKE 1 TABLET BY  MOUTH EVERY DAY IN THE MORNING 07/10/18   [provider]  gabapentin (NEURONTIN) 300 MG capsule Take 300 mg by mouth 3 (three) times daily.     [provider]  glimepiride (AMARYL) 2 MG tablet Take 2 mg by mouth daily with breakfast.  06/28/18   [provider]  levothyroxine (SYNTHROID, LEVOTHROID) 88 MCG tablet Take 88 mcg by mouth daily before breakfast.    [provider]  Multiple Vitamins-Minerals (MULTIVITAMIN WITH MINERALS) tablet Take 1 tablet by mouth daily.  02/16/11   [provider]  pantoprazole (PROTONIX) 40 MG tablet Take 1 tablet (40 mg total) by mouth daily. 08/24/18   Ladene Artist, MD   tiZANidine (ZANAFLEX) 4 MG tablet Take 4 mg by mouth as needed.  07/06/18   [provider]    ALLERGIES:  Allergies  Allergen Reactions  . Topamax [Topiramate] Other (See Comments)    CAUSED HAIR LOSS    SOCIAL HISTORY:  Social History   Tobacco Use  . Smoking status: Former Smoker    Packs/day: 0.25    Years: 5.00    Pack years: 1.25    Types: Cigarettes    Quit date: 05/24/1970    Years since quitting: 49.1  . Smokeless tobacco: Never Used  Substance Use Topics  . Alcohol use: No    Comment: 07/18/2014 "quit drinking in the 1980's"    FAMILY HISTORY: Family History  Problem Relation Age of Onset  . Hypertension Father   . Diabetes Father   . Prostate cancer Father   . Hypertension Sister   . Diabetes Sister   . Breast cancer Maternal Aunt     EXAM: BP 134/81 (BP Location: Right Arm)   Pulse 91   Temp 98.2 F (36.8 C) (Oral)   Resp 16   Ht 5\' 2"  (1.575 m)   Wt 88.9 kg   SpO2 100%   BMI 35.85 kg/m  CONSTITUTIONAL: Alert and oriented and responds appropriately to questions. Well-appearing; well-nourished HEAD: Normocephalic, atraumatic EYES: Conjunctivae clear, pupils appear equal, EOM appear intact ENT: normal nose; moist mucous membranes NECK: Supple, no midline spinal tenderness or step-off or deformity, no musculoskeletal tenderness, no tenderness over the occiput, full range of motion of the neck, no meningismus CARD: RRR; S1 and S2 appreciated; no murmurs, no clicks, no rubs, no gallops RESP: Normal chest excursion without splinting or tachypnea; breath sounds clear and equal bilaterally; no wheezes, no rhonchi, no rales, no hypoxia or respiratory distress, speaking full sentences ABD/GI: Normal bowel sounds; non-distended; soft, non-tender, no rebound, no guarding, no peritoneal signs, no hepatosplenomegaly BACK:  The back appears normal EXT: Normal ROM in all joints; no deformity noted, no edema; no cyanosis SKIN: Normal color for age and  race; warm; no rash on exposed skin NEURO: Moves all extremities equally, strength 5/5 in all 4 extremities, cranial nerves II through XII intact, normal speech, normal sensation diffusely, normal gait without ataxia PSYCH: The patient's mood and manner are appropriate.   MEDICAL DECISION MAKING: Patient here with intermittent episodes of right upper extremity numbness, tingling, "balance issues" and headache.  States similar symptoms in 2016 with previous stroke.  She is well-appearing currently and not in distress.  Low suspicion for intracranial hemorrhage, subarachnoid.  Differential includes complex migraine headache, stroke, exacerbation of old stroke symptoms.  States migraine cocktail has previously helped her headache.  Will give IV fluids, Reglan and Benadryl.  Will obtain MRI of the brain to rule out new infarct.  She does have an outpatient neurologist for follow-up.  Will obtain labs, urine, EKG.  Currently neurologically intact.  No indication for TPA.  ED PROGRESS: 2:00 AM  Patient's labs are unremarkable other than elevated kidney function with creatinine of 1.63.  I do not have a recent old for comparison.  Her last creatinine in 2018 was 1.18.  She is receiving IV hydration here.  Will avoid NSAIDs.  She states she thinks she has been told it was elevated by her PCP but cannot recall what her creatinine was recently.  EKG shows no arrhythmia or ischemia.  MRI brain pending.  States her headache is completely resolved.  3:05 AM  Pt ambulated to bathroom independently with steady gait with no ataxia.  Going to MRI now.  4:40 AM  Pt's symptoms have still resolved and she is neurologically intact.  MRI shows no acute abnormality.  Suspect complicated migraines.  Recommend follow-up with Dr. Leonie Man as an outpatient.  Patient comfortable with plan.   At this time, I do not feel there is any life-threatening condition present. I have reviewed, interpreted and discussed all results (EKG,  imaging, lab, urine as appropriate) and exam findings with patient/family. I have reviewed nursing notes and appropriate previous records.  I feel the patient is safe to be discharged home without further emergent workup and can continue workup as an outpatient as needed. Discussed usual and customary return precautions. Patient/family verbalize understanding and are comfortable with this plan.  Outpatient follow-up has been provided as needed. All questions have been answered.    EKG Interpretation  Date/Time:  Sunday July 15 2019 01:26:39 EST Ventricular Rate:  72 PR Interval:    QRS Duration: 84 QT Interval:  397 QTC Calculation: 435 R Axis:   34 Text Interpretation: Sinus rhythm Short PR interval Abnormal R-wave progression, early transition No significant change since last tracing Confirmed by Pryor Curia [phone]) on 07/15/2019 1:41:17 AM           Charmane Rozelle Logan was evaluated in Emergency Department on 07/15/2019 for the symptoms described in the history of present illness. She was evaluated in the context of the global COVID-19 pandemic, which necessitated consideration that the patient might be at risk for infection with the SARS-CoV-2 virus that causes COVID-19. Institutional protocols and algorithms that pertain to the evaluation of patients at risk for COVID-19 are in a state of rapid change based on information released by regulatory bodies including the CDC and federal and state organizations. These policies and algorithms were followed during the patient's care in the ED.  Patient was seen wearing N95, face shield, gloves.    Chieko Neises, Delice Bison, DO 07/15/19 [phone]

## 2019-07-15 NOTE — ED Notes (Addendum)
Pt wheeled to ED Rm 19 from [place] with c/o HA x2 weeks. Endorses nausea earlier, but denies any presently. +photophobia. Hx HA but reports she came in for a HA four years ago and they found that she had a stroke. Pt is a&ox4, VSS on monitors. Call light placed in reach. Neuro checks stable and intact. Denies CP/SOB. +dizziness

## 2019-07-16 ENCOUNTER — Telehealth: Payer: Self-pay | Admitting: Neurology

## 2019-07-16 NOTE — Telephone Encounter (Signed)
In my last office note I had recommended patient take Depakote ER 1000 mg a day but she is taking only find it milligrams daily.  Kindly advise the patient to increase the dose as instructed.

## 2019-07-16 NOTE — Telephone Encounter (Signed)
Pt states she was in the ER on this past Saturday for headaches and she is wondering if the provider can change her divalproex (DEPAKOTE ER) 500 MG 24 hr tablet to something stronger due to this medication not working for her. Please advise.

## 2019-07-17 NOTE — Telephone Encounter (Signed)
I call pt that Dr.SEthi reviewed her ED visit and scans. I stated he recommend taking 500 in am and 500pm for two weeks and take it daily. Pt verbalized understanding and will call back in two weeks.

## 2019-07-18 NOTE — Telephone Encounter (Signed)
I spoke to the patient she told me that her headaches have increased recently.  She was seen in the ER earlier this week and MRI scan was normal and she was given a headache cocktail medication which provided short-term relief only.  She just increased the Depakote yesterday to twice daily.  Advised that she wait a few days and if headache is not better may consider adding Topamax.  She voiced understanding

## 2019-07-18 NOTE — Telephone Encounter (Signed)
Pt called stating she is needing to speak to the RN regarding her divalproex (DEPAKOTE ER) 500 MG 24 hr tablet Please advise.

## 2019-07-18 NOTE — Telephone Encounter (Signed)
Patient called back in regards to missed call please fu

## 2019-07-18 NOTE — Telephone Encounter (Signed)
Left vm for pt to call back about depakote.

## 2019-07-18 NOTE — Telephone Encounter (Signed)
Pt stated she has work up with a headache and she has to wear a cipap. PT stated she knows to take depakote twice a day. Pt stated she has taken different dosages. HEr head is throbbing now. I stated message will be sent to Dr.SEtih. PT verbalized understanding.

## 2019-07-23 MED ORDER — VENLAFAXINE HCL ER 37.5 MG PO CP24: 37.5000 mg | ORAL_CAPSULE | Freq: Every day | ORAL | 11 refills | Status: DC

## 2019-07-23 NOTE — Telephone Encounter (Signed)
I reached out to the pt and advised via vm we have sent rx for effexor. Pt was advised to call back if the next few days to report her progress.

## 2019-07-23 NOTE — Telephone Encounter (Signed)
Pt called wanting to speak to the RN about her headaches and her medication. Please advise.

## 2019-07-23 NOTE — Addendum Note (Signed)
Addended by: Marcial Pacas on: 07/23/2019 03:13 PM   Modules accepted: Orders

## 2019-07-23 NOTE — Telephone Encounter (Signed)
Chart reviewed, patient was seen by Dr. Leonie Man on April 10, 2019 for headaches,  Failed to response with Depakote ER 500 mg twice daily,  She is on medicine for hypertension, hyperlipidemia, on 10 300 mg 3 times daily,  May add on low-dose Effexor XR 37.5 mg as headache prevention ( ERx was provided)

## 2019-07-23 NOTE — Telephone Encounter (Signed)
I reached out to the pt. She reports while taking depakote 500 bid her h/a are still persisting.   Dr. Leonie Man had note on 07/18/2019 if h/a persisted topamax could be added to the daily med regimen.   I will send message to [facility] due to Dr. Leonie Man being in the [facility] this week.

## 2019-07-26 ENCOUNTER — Ambulatory Visit: Payer: Medicare HMO | Attending: Internal Medicine

## 2019-07-26 DIAGNOSIS — Z23 Encounter for immunization: Secondary | ICD-10-CM | POA: Insufficient documentation

## 2019-07-26 NOTE — Progress Notes (Signed)
   Covid-19 Vaccination Clinic  Name:  Jeanette [name]    MRN: [id number] DOB: 10/16/1950  07/26/2019  [name] was observed post Covid-19 immunization for 15 minutes without incident. She was provided with Vaccine Information Sheet and instruction to access the V-Safe system.   [name] was instructed to call 911 with any severe reactions post vaccine: Marland Kitchen Difficulty breathing  . Swelling of face and throat  . A fast heartbeat  . A bad rash all over body  . Dizziness and weakness

## 2019-07-26 NOTE — Telephone Encounter (Signed)
Okay; thanks.

## 2019-08-15 ENCOUNTER — Other Ambulatory Visit: Payer: Self-pay | Admitting: Neurology

## 2019-08-21 ENCOUNTER — Ambulatory Visit: Payer: Medicare HMO | Attending: Internal Medicine

## 2019-08-21 DIAGNOSIS — Z23 Encounter for immunization: Secondary | ICD-10-CM

## 2019-08-21 NOTE — Progress Notes (Signed)
   Covid-19 Vaccination Clinic  Name:  Jeanette [name]    MRN: [id number] DOB: June 05, 1950  08/21/2019  [name] was observed post Covid-19 immunization for 15 minutes without incident. She was provided with Vaccine Information Sheet and instruction to access the V-Safe system.   [name] was instructed to call 911 with any severe reactions post vaccine: Marland Kitchen Difficulty breathing  . Swelling of face and throat  . A fast heartbeat  . A bad rash all over body  . Dizziness and weakness   Immunizations Administered    Name Date Dose VIS Date Route   Pfizer COVID-19 Vaccine 08/21/2019  8:17 AM 0.3 mL 05/04/2019 Intramuscular   Manufacturer: [place]   Lot: [id number]   [place]: [id number]

## 2019-08-24 DIAGNOSIS — E669 Obesity, unspecified: Secondary | ICD-10-CM | POA: Insufficient documentation

## 2019-08-24 DIAGNOSIS — Z791 Long term (current) use of non-steroidal anti-inflammatories (NSAID): Secondary | ICD-10-CM | POA: Insufficient documentation

## 2019-09-12 NOTE — Progress Notes (Signed)
@Patient  ID: Jeanette [name], female    DOB: 1950-06-29, 69 y.o.   MRN: [id number]  Chief Complaint  Patient presents with  . Follow-up    OSA.  Sleep well.  Mask leaks about every night, wakes her up.    Referring provider: Bartholome Bill, MD  HPI:  69 year old female former smoker followed in our office for obstructive sleep apnea  PMH: Anemia, type 2 diabetes, thyroid nodule, hyperlipidemia, chronic kidney disease, CHF Smoker/ Smoking History: Former smoker Maintenance:   Pt of: Dr. Elsworth Soho  09/13/2019  - Visit   69 year old female former smoker initially consulted with our practice in November/2020 for evaluation for sleep apnea her Epworth score in November/2020 was 11.  Home sleep study in December/2020 showed mild obstructive sleep apnea with an AHI of 12.  Patient reports that she has been using her CPAP without issue.  CPAP compliance report listed below:  08/14/2019-09/12/2019-30 had a last 30 days use, 13 of those days greater than 4 hours, average usage 3 hours and 54 minutes, APAP setting 5-20, 95th percentile 15.3, AHI 2.2 >>> Leaks present on report  Patient has already presented back to the DME company once for refitting of the mask, she reports that they emphasized need for a fullface mask.  Patient is interested in a nasal mask.  We will discuss this today.  She does feel that with using her CPAP her sleep quality has improved.   Questionaires / Pulmonary Flowsheets:   Epworth:  Results of the Epworth flowsheet 04/02/2019  Sitting and reading 2  Watching TV 3  Sitting, inactive in a public place (e.g. a theatre or a meeting) 0  As a passenger in a car for an hour without a break 0  Lying down to rest in the afternoon when circumstances permit 3  Sitting and talking to someone 0  Sitting quietly after a lunch without alcohol 3  In a car, while stopped for a few minutes in traffic 0  Total score 11    Tests:   CT chest 03/2014 left upper lobe 13 x  12 mm calcified nodule, left hilar calcified lymph nodes  05/16/2019-Home sleep study-AHI 12.2, SaO2 low 79%  FENO:  No results found for: NITRICOXIDE  PFT: No flowsheet data found.  WALK:  No flowsheet data found.  Imaging: No results found.  Lab Results:  CBC    Component Value Date/Time   WBC 6.8 07/15/2019 0019   RBC 4.67 07/15/2019 0019   HGB 12.6 07/15/2019 0019   HGB 12.0 03/22/2014 1139   HCT 40.8 07/15/2019 0019   HCT 36.9 03/22/2014 1139   PLT 250 07/15/2019 0019   PLT 186 03/22/2014 1139   MCV 87.4 07/15/2019 0019   MCV 81.5 03/22/2014 1139   MCH 27.0 07/15/2019 0019   MCHC 30.9 07/15/2019 0019   RDW 14.9 07/15/2019 0019   RDW 16.1 (H) 03/22/2014 1139   LYMPHSABS 3.2 07/15/2019 0019   LYMPHSABS 1.9 03/22/2014 1139   MONOABS 0.6 07/15/2019 0019   MONOABS 0.3 03/22/2014 1139   EOSABS 0.1 07/15/2019 0019   EOSABS 0.1 03/22/2014 1139   BASOSABS 0.0 07/15/2019 0019   BASOSABS 0.0 03/22/2014 1139    BMET    Component Value Date/Time   NA 141 07/15/2019 0019   NA 143 03/22/2014 1140   K 4.5 07/15/2019 0019   K 4.0 03/22/2014 1140   CL 107 07/15/2019 0019   CO2 27 07/15/2019 0019   CO2 29 03/22/2014 1140  GLUCOSE 73 07/15/2019 0019   GLUCOSE 89 03/22/2014 1140   BUN 24 (H) 07/15/2019 0019   BUN 19.6 03/22/2014 1140   CREATININE 1.63 (H) 07/15/2019 0019   CREATININE 1.1 03/22/2014 1140   CALCIUM 8.8 (L) 07/15/2019 0019   CALCIUM 10.3 03/22/2014 1140   GFRNONAA 32 (L) 07/15/2019 0019   GFRAA 37 (L) 07/15/2019 0019    BNP No results found for: BNP  ProBNP No results found for: PROBNP  Specialty Problems      Pulmonary Problems   OSA (obstructive sleep apnea)    05/16/2019-Home sleep study-AHI 12.2, SaO2 low 79%       Wheezing      Allergies  Allergen Reactions  . Topamax [Topiramate] Other (See Comments)    CAUSED HAIR LOSS    Immunization History  Administered Date(s) Administered  . Influenza, High Dose Seasonal PF  01/12/2019  . Influenza,inj,Quad PF,6+ Mos 02/07/2015, 01/15/2016, 02/02/2017  . Influenza,inj,quad, With Preservative 03/12/2014  . Influenza-Unspecified 01/22/2014, 02/07/2015, 01/15/2016, 02/02/2017, 02/21/2018  . PFIZER SARS-COV-2 Vaccination 07/26/2019, 08/21/2019  . PPD Test 08/13/2014  . Pneumococcal Conjugate-13 08/07/2014, 02/23/2018  . Pneumococcal Polysaccharide-23 05/25/2011, 09/15/2017  . Tdap 02/05/2014  . Zoster 08/07/2014, 11/16/2017  . Zoster Recombinat (Shingrix) 08/07/2014, 11/16/2017, 02/23/2018    Past Medical History:  Diagnosis Date  . Anemia   . Arthritis    "back" (07/18/2014)  . Cancer of left breast ([place])   . Chronic back pain   . Headache   . Heart murmur    many years ago  . Hyperlipidemia   . Hypertension   . Hypothyroidism    thyroidectomy 07/18/2014  . Neuromuscular disorder ([facility])    neuropathy hands and feet  . Personal history of chemotherapy   . Personal history of radiation therapy   . Pneumonia   . Stroke ([place])   . Type II diabetes mellitus ([facility])     Tobacco History: Social History   Tobacco Use  Smoking Status Former Smoker  . Packs/day: 0.25  . Years: 5.00  . Pack years: 1.25  . Types: Cigarettes  . Quit date: 05/24/1970  . Years since quitting: 49.3  Smokeless Tobacco Never Used   Counseling given: Yes   Continue to not smoke  Outpatient Encounter Medications as of 09/13/2019  Medication Sig  . ACCU-CHEK AVIVA PLUS test strip   . albuterol (VENTOLIN HFA) 108 (90 Base) MCG/ACT inhaler Inhale 2 puffs into the lungs every 6 (six) hours as needed for wheezing or shortness of breath.  Marland Kitchen amLODipine (NORVASC) 10 MG tablet Take 10 mg by mouth daily.  Marland Kitchen atorvastatin (LIPITOR) 20 MG tablet Take 20 mg by mouth daily.   . calcium-vitamin D (OSCAL WITH D) 500-200 MG-UNIT per tablet Take 2 tablets by mouth 2 (two) times daily.  . clopidogrel (PLAVIX) 75 MG tablet Take 1 tablet (75 mg total) by mouth daily.  . furosemide (LASIX) 20 MG  tablet Take 20 mg by mouth daily.   Marland Kitchen gabapentin (NEURONTIN) 300 MG capsule Take 300 mg by mouth 3 (three) times daily.   Marland Kitchen glimepiride (AMARYL) 2 MG tablet Take 2 mg by mouth daily with breakfast.   . levothyroxine (SYNTHROID, LEVOTHROID) 88 MCG tablet Take 88 mcg by mouth daily before breakfast.  . Multiple Vitamins-Minerals (MULTIVITAMIN WITH MINERALS) tablet Take 1 tablet by mouth daily.   Marland Kitchen olmesartan (BENICAR) 20 MG tablet Take 20 mg by mouth daily.  Marland Kitchen aspirin EC 81 MG tablet Take 81 mg by mouth daily.  . [  DISCONTINUED] Apoaequorin (PREVAGEN) 10 MG CAPS Take 1 capsule by mouth every morning. (Patient not taking: Reported on 07/15/2019)  . [DISCONTINUED] divalproex (DEPAKOTE ER) 500 MG 24 hr tablet Take 1 tablet (500 mg total) by mouth 2 (two) times daily. (Patient not taking: Reported on 09/13/2019)  . [DISCONTINUED] pantoprazole (PROTONIX) 40 MG tablet Take 1 tablet (40 mg total) by mouth daily. (Patient not taking: Reported on 09/13/2019)  . [DISCONTINUED] venlafaxine XR (EFFEXOR-XR) 37.5 MG 24 hr capsule TAKE 1 CAPSULE BY MOUTH DAILY WITH BREAKFAST. (Patient not taking: Reported on 09/13/2019)   No facility-administered encounter medications on file as of 09/13/2019.     Review of Systems  Review of Systems  Constitutional: Negative for activity change, fatigue and fever.  HENT: Negative for sinus pressure, sinus pain and sore throat.   Respiratory: Negative for cough, shortness of breath and wheezing.   Cardiovascular: Negative for chest pain and palpitations.  Gastrointestinal: Negative for diarrhea, nausea and vomiting.  Musculoskeletal: Negative for arthralgias.  Neurological: Negative for dizziness.  Psychiatric/Behavioral: Negative for sleep disturbance. The patient is not nervous/anxious.      Physical Exam  BP 120/70 (BP Location: Right Arm, Cuff Size: Normal)   Pulse 80   Temp (!) 97 F (36.1 C) (Temporal)   Ht 5\' 2"  (1.575 m)   Wt 195 lb 3.2 oz (88.5 kg)   SpO2 100%    BMI 35.70 kg/m   Wt Readings from Last 5 Encounters:  09/13/19 195 lb 3.2 oz (88.5 kg)  07/14/19 196 lb (88.9 kg)  04/10/19 199 lb (90.3 kg)  04/02/19 198 lb 3.2 oz (89.9 kg)  12/07/18 190 lb (86.2 kg)    BMI Readings from Last 5 Encounters:  09/13/19 35.70 kg/m  07/14/19 35.85 kg/m  04/10/19 36.40 kg/m  04/02/19 36.25 kg/m  12/07/18 34.75 kg/m     Physical Exam Vitals and nursing note reviewed.  Constitutional:      General: She is not in acute distress.    Appearance: Normal appearance. She is obese.  HENT:     Head: Normocephalic and atraumatic.     Right Ear: External ear normal.     Left Ear: External ear normal.  Cardiovascular:     Rate and Rhythm: Normal rate and regular rhythm.     Pulses: Normal pulses.     Heart sounds: Normal heart sounds. No murmur.  Pulmonary:     Effort: Pulmonary effort is normal. No respiratory distress.     Breath sounds: Normal breath sounds. No decreased air movement. No decreased breath sounds, wheezing or rales.  Musculoskeletal:     Cervical back: Normal range of motion.  Skin:    General: Skin is warm and dry.     Capillary Refill: Capillary refill takes less than 2 seconds.  Neurological:     General: No focal deficit present.     Mental Status: She is alert and oriented to person, place, and time. Mental status is at baseline.     Gait: Gait normal.  Psychiatric:        Mood and Affect: Mood normal.        Behavior: Behavior normal.        Thought Content: Thought content normal.        Judgment: Judgment normal.       Assessment & Plan:   OSA (obstructive sleep apnea) Mild obstructive sleep apnea based off home sleep study Adequate compliance Leaks on exam Patient interested in reevaluating mask fitting and choice  Plan: Order placed to [facility] for reevaluation, mask fit Mask of choice, patient interested in nasal mask Continue CPAP therapy 62-month follow-up with our office    Return in  about 3 months (around 12/13/2019), or if symptoms worsen or fail to improve, for Follow up with Wyn Quaker FNP-C.   Lauraine Rinne, NP  09/13/2019   This appointment required 25 minutes of patient care (this includes precharting, chart review, review of results, face-to-face care, etc.).

## 2019-09-13 ENCOUNTER — Ambulatory Visit: Payer: Medicare HMO | Admitting: Pulmonary Disease

## 2019-09-13 ENCOUNTER — Encounter: Payer: Self-pay | Admitting: Pulmonary Disease

## 2019-09-13 ENCOUNTER — Other Ambulatory Visit: Payer: Self-pay

## 2019-09-13 VITALS — BP 120/70 | HR 80 | Temp 97.0°F | Ht 62.0 in | Wt 195.2 lb

## 2019-09-13 DIAGNOSIS — G4733 Obstructive sleep apnea (adult) (pediatric): Secondary | ICD-10-CM | POA: Diagnosis not present

## 2019-09-13 NOTE — Assessment & Plan Note (Signed)
Mild obstructive sleep apnea based off home sleep study Adequate compliance Leaks on exam Patient interested in reevaluating mask fitting and choice  Plan: Order placed to [facility] for reevaluation, mask fit Mask of choice, patient interested in nasal mask Continue CPAP therapy 54-month follow-up with our office

## 2019-09-13 NOTE — Addendum Note (Signed)
Addended by: Vanessa Barbara on: 09/13/2019 10:10 AM   Modules accepted: Orders

## 2019-09-13 NOTE — Patient Instructions (Addendum)
You were seen today by Lauraine Rinne, NP  for:   1. OSA (obstructive sleep apnea)  We will place an order for [employer] to refit your mask We will also place an order for mask of choice that you can try nasal mask We will plan on having an office visit or virtual visit in about 3 months to check your compliance and mask fit, sooner if needed  We recommend that you continue using your CPAP daily >>>Keep up the hard work using your device >>> Goal should be wearing this for the entire night that you are sleeping, at least 4 to 6 hours  Remember:  . Do not drive or operate heavy machinery if tired or drowsy.  . Please notify the supply company and office if you are unable to use your device regularly due to missing supplies or machine being broken.  . Work on maintaining a healthy weight and following your recommended nutrition plan  . Maintain proper daily exercise and movement  . Maintaining proper use of your device can also help improve management of other chronic illnesses such as: Blood pressure, blood sugars, and weight management.   BiPAP/ CPAP Cleaning:  >>>Clean weekly, with Dawn soap, and bottle brush.  Set up to air dry. >>> Wipe mask out daily with wet wipe or towelette    Follow Up:    Return in about 3 months (around 12/13/2019), or if symptoms worsen or fail to improve, for Follow up with Wyn Quaker FNP-C.   Please do your part to reduce the spread of COVID-19:      Reduce your risk of any infection  and COVID19 by using the similar precautions used for avoiding the common cold or flu:  Marland Kitchen Wash your hands often with soap and warm water for at least 20 seconds.  If soap and water are not readily available, use an alcohol-based hand sanitizer with at least 60% alcohol.  . If coughing or sneezing, cover your mouth and nose by coughing or sneezing into the elbow areas of your shirt or coat, into a tissue or into your sleeve (not your hands). Langley Gauss A MASK when in public  .  Avoid shaking hands with others and consider head nods or verbal greetings only. . Avoid touching your eyes, nose, or mouth with unwashed hands.  . Avoid close contact with people who are sick. . Avoid places or events with large numbers of people in one location, like concerts or sporting events. . If you have some symptoms but not all symptoms, continue to monitor at home and seek medical attention if your symptoms worsen. . If you are having a medical emergency, call 911.   [place] / e-Visit: [email or web address]         [facility]: [facility]: [id number]                   [facility]: [id number]     It is flu season:   >>> Best ways to protect herself from the flu: Receive the yearly flu vaccine, practice good hand hygiene washing with soap and also using hand sanitizer when available, eat a nutritious meals, get adequate rest, hydrate appropriately   Please contact the office if your symptoms worsen or you have concerns that you are not improving.   Thank you for choosing [facility] for your healthcare, and for allowing [place] to  partner with you on your healthcare journey. I am thankful to be able to provide care to you today.   Wyn Quaker FNP-C    Sleep Apnea Sleep apnea affects breathing during sleep. It causes breathing to stop for a short time or to become shallow. It can also increase the risk of:  Heart attack.  Stroke.  Being very overweight (obese).  Diabetes.  Heart failure.  Irregular heartbeat. The goal of treatment is to help you breathe normally again. What are the causes? There are three kinds of sleep apnea:  Obstructive sleep apnea. This is caused by a blocked or collapsed airway.  Central sleep apnea. This happens when the brain does not send the right signals to the muscles  that control breathing.  Mixed sleep apnea. This is a combination of obstructive and central sleep apnea. The most common cause of this condition is a collapsed or blocked airway. This can happen if:  Your throat muscles are too relaxed.  Your tongue and tonsils are too large.  You are overweight.  Your airway is too small. What increases the risk?  Being overweight.  Smoking.  Having a small airway.  Being older.  Being female.  Drinking alcohol.  Taking medicines to calm yourself (sedatives or tranquilizers).  Having family members with the condition. What are the signs or symptoms?  Trouble staying asleep.  Being sleepy or tired during the day.  Getting angry a lot.  Loud snoring.  Headaches in the morning.  Not being able to focus your mind (concentrate).  Forgetting things.  Less interest in sex.  Mood swings.  Personality changes.  Feelings of sadness (depression).  Waking up a lot during the night to pee (urinate).  Dry mouth.  Sore throat. How is this diagnosed?  Your medical history.  A physical exam.  A test that is done when you are sleeping (sleep study). The test is most often done in a sleep lab but may also be done at home. How is this treated?   Sleeping on your side.  Using a medicine to get rid of mucus in your nose (decongestant).  Avoiding the use of alcohol, medicines to help you relax, or certain pain medicines (narcotics).  Losing weight, if needed.  Changing your diet.  Not smoking.  Using a machine to open your airway while you sleep, such as: ? An oral appliance. This is a mouthpiece that shifts your lower jaw forward. ? A CPAP device. This device blows air through a mask when you breathe out (exhale). ? An EPAP device. This has valves that you put in each nostril. ? A BPAP device. This device blows air through a mask when you breathe in (inhale) and breathe out.  Having surgery if other treatments do not  work. It is important to get treatment for sleep apnea. Without treatment, it can lead to:  High blood pressure.  Coronary artery disease.  In men, not being able to have an erection (impotence).  Reduced thinking ability. Follow these instructions at home: Lifestyle  Make changes that your doctor recommends.  Eat a healthy diet.  Lose weight if needed.  Avoid alcohol, medicines to help you relax, and some pain medicines.  Do not use any products that contain nicotine or tobacco, such as cigarettes, e-cigarettes, and chewing tobacco. If you need help quitting, ask your doctor. General instructions  Take over-the-counter and prescription medicines only as told by your doctor.  If you were given a machine to use  while you sleep, use it only as told by your doctor.  If you are having surgery, make sure to tell your doctor you have sleep apnea. You may need to bring your device with you.  Keep all follow-up visits as told by your doctor. This is important. Contact a doctor if:  The machine that you were given to use during sleep bothers you or does not seem to be working.  You do not get better.  You get worse. Get help right away if:  Your chest hurts.  You have trouble breathing in enough air.  You have an uncomfortable feeling in your back, arms, or stomach.  You have trouble talking.  One side of your body feels weak.  A part of your face is hanging down. These symptoms may be an emergency. Do not wait to see if the symptoms will go away. Get medical help right away. Call your local emergency services (911 in the [place] Do not drive yourself to the hospital. Summary  This condition affects breathing during sleep.  The most common cause is a collapsed or blocked airway.  The goal of treatment is to help you breathe normally while you sleep. This information is not intended to replace advice given to you by your health care provider. Make sure you discuss any  questions you have with your health care provider. Document Revised: 02/24/2018 Document Reviewed: 01/03/2018 Elsevier Patient Education  [employer].

## 2019-10-09 ENCOUNTER — Other Ambulatory Visit: Payer: Self-pay

## 2019-10-09 ENCOUNTER — Ambulatory Visit: Payer: Medicare HMO | Admitting: Adult Health

## 2019-10-09 ENCOUNTER — Ambulatory Visit: Payer: Medicare Other | Admitting: Adult Health

## 2019-10-09 ENCOUNTER — Encounter: Payer: Self-pay | Admitting: Adult Health

## 2019-10-09 VITALS — BP 118/77 | HR 77 | Ht 62.0 in | Wt 189.0 lb

## 2019-10-09 DIAGNOSIS — G3184 Mild cognitive impairment, so stated: Secondary | ICD-10-CM

## 2019-10-09 DIAGNOSIS — G43709 Chronic migraine without aura, not intractable, without status migrainosus: Secondary | ICD-10-CM

## 2019-10-09 DIAGNOSIS — IMO0002 Reserved for concepts with insufficient information to code with codable children: Secondary | ICD-10-CM

## 2019-10-09 MED ORDER — AMITRIPTYLINE HCL 25 MG PO TABS: 25.0000 mg | ORAL_TABLET | Freq: Every day | ORAL | 3 refills | Status: DC

## 2019-10-09 NOTE — Progress Notes (Signed)
[facility] [address] [place] [place] [address] [phone]       OFFICE FOLLOW UP VISIT NOTE  [name] Date of Birth:  1951-02-22 Medical Record Number:  [id number]   Referring MD: Precious Haws Reason for Referral: Memory loss and chronic headache   Chief complaint: Chief Complaint  Patient presents with  . Follow-up    tx rm pt here for a migraines. Pt has new sx on neck pain going up to her head. Pt is alone      HPI:  Today, 10/09/2019, Jeanette [name] returns for follow-up regarding memory concerns and headaches.  Since prior visit, she was evaluated in the ED on 07/14/2019 for intermittent episodes of right upper extremity numbness, tingling, balance issues and headache.  Headache resolved after migraine cocktail.  MRI unremarkable and was advised to follow-up outpatient.  She denies reoccurring transient episodes of numbness, tingling and balance but continues to experience daily headaches.  Longstanding history of headache with use of Depakote 500 mg twice daily and at prior visit recommended increasing dosage to 1000 mg twice daily but apparently was only taking once daily.  She was advised to increase to twice daily after ED admission as well as being started on venlafaxine Dr Krista Blue who was on-call provider at that time.  She reports since that time, headaches have been daily with at time 9/10 pain located "all over my head" associated with photophobia, phonophobia but denies nausea or vomiting.  She reports at times headache can be located occipital area with a pressure type band sensation.  She states she attempted to take 1500 mg of Depakote spread out throughout the day but did not notice any benefit therefore currently only taking one 500 mg tablet daily in addition to venlafaxine 37.5 mg daily.  Due to continued headaches, she is questioning change in medical management. She has not been on any other prior preventative medications.  She was  recently found to have positive ANA and does have rheumatology evaluation on 7/1.  Blood pressure stable at 118/77 and does monitor at home and typically lower.  History of OSA with ongoing compliance of CPAP currently being followed by pulmonology.  Cognition has been stable without worsening.  No further concerns at this time.    History provided for reference purposes only Update 04/10/2019 Dr Leonie Man:  She returns for follow-up after last visit 4 months ago.  She states her subjective memory difficulties appear more or less unchanged.  She denies significant worsening of her symptoms or ability to do activities of daily living for herself.  She mostly struggles with recent information and short-term memory but long-term memory appears to be preserved.  She did undergo lab work for reversible causes of memory loss and 12/07/2018 and vitamin B12, TSH, RPR and homocystine were all normal.  EEG done on 01/15/2019 was normal.  MRI scan of the brain on 01/27/2019 was also normal.  She has not been participating regularly in cognitively challenging activities.  She has not been quite physically active either.  She has heard about provision over-the-counter and would like to try it.  She continues to have headaches but they are not as severe and occur about 3 or 4 times a week.  She is currently on Depakote ER 500 twice daily and it seems to help her and she is tolerating it well without any side effects.  She has no new complaints today.  Initial visit 12/07/2018 Dr. Leonie Man : Jeanette [name] is  a 69 year old pleasant African-American lady who is seen today for evaluation for memory difficulties.  History is obtained from the patient, review of electronic medical records and I personally reviewed imaging films in PACS.  She has a past medical history of breast cancer 2018 status post surgery, chemotherapy and radiation now in remission, chronic headaches, hypertension, hyperlipidemia, hypothyroidism and chronic back pain.   She states for the last 3 months or so she is noticed some memory and cognitive difficulties.  She describes that mostly her short-term memory is poor but occasionally she has even some long-term memory difficulties.  She has trouble remembering names and recent events.  She has learned to write things down.  She has not noticed any trouble in driving and has never gotten lost.  She has been able to manage all her activities of daily living and continues to live alone.  She does admit to her mood being slightly under at times but denies depression.  She is never been on treatment for depression.  She has chronic headaches and has seen me several years ago and has been taking Depakote ER 500 twice daily and feels her headaches are under control she can only occasional headaches.  She denies any stroke, seizure, significant head injury with loss of consciousness.  There is no family history of dementia or cognitive problems.  She has not had any recent brain scan or lab work for memory loss done.  She has a prior history of abnormal MRI showing transient left medial temporal diffusion hyperintensity on 02/22/2015 of unclear significance which was found to have resolved on follow-up MRI in 03/30/2015.  She underwent extensive work-up for stroke and seizures which was all negative.     ROS:   14 system review of systems is positive for headache and all other systems negative PMH:  Past Medical History:  Diagnosis Date  . Anemia   . Arthritis    "back" (07/18/2014)  . Cancer of left breast ([place])   . Chronic back pain   . Headache   . Heart murmur    many years ago  . Hyperlipidemia   . Hypertension   . Hypothyroidism    thyroidectomy 07/18/2014  . Neuromuscular disorder (H[facility])    neuropathy hands and feet  . Personal history of chemotherapy   . Personal history of radiation therapy   . Pneumonia   . Stroke ([place])   . Type II diabetes mellitus ([place])     Social History:  Social History    Socioeconomic History  . Marital status: Divorced    Spouse name: Not on file  . Number of children: 1  . Years of education: Not on file  . Highest education level: Some college, no degree  Occupational History  . Not on file  Tobacco Use  . Smoking status: Former Smoker    Packs/day: 0.25    Years: 5.00    Pack years: 1.25    Types: Cigarettes    Quit date: 05/24/1970    Years since quitting: 49.4  . Smokeless tobacco: Never Used  Substance and Sexual Activity  . Alcohol use: No    Comment: 07/18/2014 "quit drinking in the 1980's"  . Drug use: Not Currently    Types: Marijuana    Comment: occassionally way back when  . Sexual activity: Not on file  Other Topics Concern  . Not on file  Social History Narrative   Lives at home alone   Right handed   Caffeine: 1  cup coffee daily   Social Determinants of Health   Financial Resource Strain:   . Difficulty of Paying Living Expenses:   Food Insecurity:   . Worried About Charity fundraiser in the Last Year:   . Arboriculturist in the Last Year:   Transportation Needs:   . Film/video editor (Medical):   Marland Kitchen Lack of Transportation (Non-Medical):   Physical Activity:   . Days of Exercise per Week:   . Minutes of Exercise per Session:   Stress:   . Feeling of Stress :   Social Connections:   . Frequency of Communication with Friends and Family:   . Frequency of Social Gatherings with Friends and Family:   . Attends Religious Services:   . Active Member of Clubs or Organizations:   . Attends Archivist Meetings:   Marland Kitchen Marital Status:   Intimate Partner Violence:   . Fear of Current or Ex-Partner:   . Emotionally Abused:   Marland Kitchen Physically Abused:   . Sexually Abused:     Medications:   Current Outpatient Medications on File Prior to Visit  Medication Sig Dispense Refill  . ACCU-CHEK AVIVA PLUS test strip     . albuterol (VENTOLIN HFA) 108 (90 Base) MCG/ACT inhaler Inhale 2 puffs into the lungs every 6  (six) hours as needed for wheezing or shortness of breath. 18 g 11  . amLODipine (NORVASC) 10 MG tablet Take 10 mg by mouth daily.    Marland Kitchen aspirin EC 81 MG tablet Take 81 mg by mouth daily.    Marland Kitchen atorvastatin (LIPITOR) 20 MG tablet Take 20 mg by mouth daily.     . calcium-vitamin D (OSCAL WITH D) 500-200 MG-UNIT per tablet Take 2 tablets by mouth 2 (two) times daily. 60 tablet 1  . clopidogrel (PLAVIX) 75 MG tablet Take 1 tablet (75 mg total) by mouth daily. 90 tablet 3  . furosemide (LASIX) 20 MG tablet Take 20 mg by mouth daily.     Marland Kitchen gabapentin (NEURONTIN) 300 MG capsule Take 300 mg by mouth 3 (three) times daily.     Marland Kitchen glimepiride (AMARYL) 2 MG tablet Take 2 mg by mouth daily with breakfast.     . levothyroxine (SYNTHROID, LEVOTHROID) 88 MCG tablet Take 88 mcg by mouth daily before breakfast.    . Multiple Vitamins-Minerals (MULTIVITAMIN WITH MINERALS) tablet Take 1 tablet by mouth daily.     Marland Kitchen olmesartan (BENICAR) 20 MG tablet Take 20 mg by mouth daily.     No current facility-administered medications on file prior to visit.    Allergies:   Allergies  Allergen Reactions  . Topamax [Topiramate] Other (See Comments)    CAUSED HAIR LOSS    Vitals Today's Vitals   10/09/19 0832  BP: 118/77  Pulse: 77  Weight: 189 lb (85.7 kg)  Height: 5\' 2"  (1.575 m)   Body mass index is 34.57 kg/m.    Physical Exam General: well developed, well nourished pleasant middle-aged African-American lady, seated, in no evident distress Head: head normocephalic and atraumatic.   Neck: supple with no carotid or supraclavicular bruits Cardiovascular: regular rate and rhythm, no murmurs Musculoskeletal: no deformity Skin:  no rash/petichiae Vascular:  Normal pulses all extremities  Neurologic Exam Mental Status: Awake and fully alert.  Fluent speech and language.  Oriented to place and time. Recent and remote memory intact. Attention span, concentration and fund of knowledge appropriate. Mood and  affect appropriate.    Cranial Nerves: Fundoscopic  exam not done. Pupils equal, briskly reactive to light. Extraocular movements full without nystagmus. Visual fields full to confrontation. Hearing intact. Facial sensation intact. Face, tongue, palate moves normally and symmetrically.  Motor: Normal bulk and tone. Normal strength in all tested extremity muscles. Sensory.: intact to touch , pinprick , position and vibratory sensation.  Coordination: Rapid alternating movements normal in all extremities. Finger-to-nose and heel-to-shin performed accurately bilaterally. Gait and Station: Arises from chair without difficulty. Stance is normal. Gait demonstrates normal stride length and balance . Able to heel, toe and tandem walk without difficulty.  Reflexes: 1+ and symmetric. Toes downgoing.       ASSESSMENT: 69 year old African-American lady with PMH of HTN, HLD, DM, OSA on CPAP and prior stroke with subacute memory and mild cognitive difficulties likely due to mild cognitive impairment as well as recent worsening of chronic headaches.  Recent evaluation in ED on 07/14/2019 with transient left arm numbness/tingling, imbalance and headache with improvement of symptoms after migraine cocktail.  MRI unremarkable.  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  Currently on low-dose of both therefore no indication for tapering.  Recommended discontinuing and initiate amitriptyline 25 mg nightly.  Discussion regarding possible side effects and to call office with any concerns.  Advised to call after 1 week for possible need of dosage increase. -Stress reduction techniques discussed as this can be contributing to headaches. -Cognition has been stable without worsening   Follow-up in 3 months or call earlier if needed  I spent 35 minutes of  face-to-face and non-face-to-face time with patient.  This included previsit chart review, lab review, study review, order entry, electronic health record documentation, patient education regarding daily headaches, transition of current medical management and answered all questions to patient satisfaction    Frann Rider, [facility]  [facility] [address] [place], [place] [address]  Phone [phone] Fax [phone] Note: This document was prepared with digital dictation and possible smart phrase technology. Any transcriptional errors that result from this process are unintentional.

## 2019-10-09 NOTE — Patient Instructions (Addendum)
Your Plan:  Start amitriptyline 25 mg nightly for ongoing headaches - continue for 1 week and call office if your headaches do not improve for dosage increase. Also please call if you have any difficulty tolerating.   Stop effexor and depakote as no benefit for headache prevention   Follow up in 3 months or call earlier if needed      Thank you for coming to see [place] at [facility]. I hope we have been able to provide you high quality care today.  You may receive a patient satisfaction survey over the next few weeks. We would appreciate your feedback and comments so that we may continue to improve ourselves and the health of our patients.   Amitriptyline tablets What is this medicine? AMITRIPTYLINE (a mee TRIP ti leen) is used to treat depression. This medicine may be used for other purposes; ask your health care provider or pharmacist if you have questions. COMMON BRAND NAME(S): Elavil, Vanatrip What should I tell my health care provider before I take this medicine? They need to know if you have any of these conditions:  an alcohol problem  asthma, difficulty breathing  bipolar disorder or schizophrenia  difficulty passing urine, prostate trouble  glaucoma  heart disease or previous heart attack  liver disease  over active thyroid  seizures  thoughts or plans of suicide, a previous suicide attempt, or family history of suicide attempt  an unusual or allergic reaction to amitriptyline, other medicines, foods, dyes, or preservatives  pregnant or trying to get pregnant  breast-feeding How should I use this medicine? Take this medicine by mouth with a drink of water. Follow the directions on the prescription label. You can take the tablets with or without food. Take your medicine at regular intervals. Do not take it more often than directed. Do not stop taking this medicine suddenly except upon the advice of your doctor. Stopping this medicine too  quickly may cause serious side effects or your condition may worsen. A special MedGuide will be given to you by the pharmacist with each prescription and refill. Be sure to read this information carefully each time. Talk to your pediatrician regarding the use of this medicine in children. Special care may be needed. Overdosage: If you think you have taken too much of this medicine contact a poison control center or emergency room at once. NOTE: This medicine is only for you. Do not share this medicine with others. What if I miss a dose? If you miss a dose, take it as soon as you can. If it is almost time for your next dose, take only that dose. Do not take double or extra doses. What may interact with this medicine? Do not take this medicine with any of the following medications:  arsenic trioxide  certain medicines used to regulate abnormal heartbeat or to treat other heart conditions  cisapride  droperidol  halofantrine  linezolid  MAOIs like Carbex, Eldepryl, Marplan, Nardil, and Parnate  methylene blue  other medicines for mental depression  phenothiazines like perphenazine, thioridazine and chlorpromazine  pimozide  probucol  procarbazine  sparfloxacin  St. John's Wort This medicine may also interact with the following medications:  atropine and related drugs like hyoscyamine, scopolamine, tolterodine and others  barbiturate medicines for inducing sleep or treating seizures, like phenobarbital  cimetidine  disulfiram  ethchlorvynol  thyroid hormones such as levothyroxine  ziprasidone This list may not describe all possible interactions. Give your health care provider a list of all  the medicines, herbs, non-prescription drugs, or dietary supplements you use. Also tell them if you smoke, drink alcohol, or use illegal drugs. Some items may interact with your medicine. What should I watch for while using this medicine? Tell your doctor if your symptoms do not  get better or if they get worse. Visit your doctor or health care professional for regular checks on your progress. Because it may take several weeks to see the full effects of this medicine, it is important to continue your treatment as prescribed by your doctor. Patients and their families should watch out for new or worsening thoughts of suicide or depression. Also watch out for sudden changes in feelings such as feeling anxious, agitated, panicky, irritable, hostile, aggressive, impulsive, severely restless, overly excited and hyperactive, or not being able to sleep. If this happens, especially at the beginning of treatment or after a change in dose, call your health care professional. Dennis Bast may get drowsy or dizzy. Do not drive, use machinery, or do anything that needs mental alertness until you know how this medicine affects you. Do not stand or sit up quickly, especially if you are an older patient. This reduces the risk of dizzy or fainting spells. Alcohol may interfere with the effect of this medicine. Avoid alcoholic drinks. Do not treat yourself for coughs, colds, or allergies without asking your doctor or health care professional for advice. Some ingredients can increase possible side effects. Your mouth may get dry. Chewing sugarless gum or sucking hard candy, and drinking plenty of water will help. Contact your doctor if the problem does not go away or is severe. This medicine may cause dry eyes and blurred vision. If you wear contact lenses you may feel some discomfort. Lubricating drops may help. See your eye doctor if the problem does not go away or is severe. This medicine can cause constipation. Try to have a bowel movement at least every 2 to 3 days. If you do not have a bowel movement for 3 days, call your doctor or health care professional. This medicine can make you more sensitive to the sun. Keep out of the sun. If you cannot avoid being in the sun, wear protective clothing and use  sunscreen. Do not use sun lamps or tanning beds/booths. What side effects may I notice from receiving this medicine? Side effects that you should report to your doctor or health care professional as soon as possible:  allergic reactions like skin rash, itching or hives, swelling of the face, lips, or tongue  anxious  breathing problems  changes in vision  confusion  elevated mood, decreased need for sleep, racing thoughts, impulsive behavior  eye pain  fast, irregular heartbeat  feeling faint or lightheaded, falls  feeling agitated, angry, or irritable  fever with increased sweating  hallucination, loss of contact with reality  seizures  stiff muscles  suicidal thoughts or other mood changes  tingling, pain, or numbness in the feet or hands  trouble passing urine or change in the amount of urine  trouble sleeping  unusually weak or tired  vomiting  yellowing of the eyes or skin Side effects that usually do not require medical attention (report to your doctor or health care professional if they continue or are bothersome):  change in sex drive or performance  change in appetite or weight  constipation  dizziness  dry mouth  nausea  tired  tremors  upset stomach This list may not describe all possible side effects. Call your doctor for  medical advice about side effects. You may report side effects to FDA at 1-800-FDA-1088. Where should I keep my medicine? Keep out of the reach of children. Store at room temperature between 20 and 25 degrees C (68 and 77 degrees F). Throw away any unused medicine after the expiration date. NOTE: This sheet is a summary. It may not cover all possible information. If you have questions about this medicine, talk to your doctor, pharmacist, or health care provider.  2020 Elsevier/Gold Standard (2018-05-02 13:04:32)

## 2019-10-12 NOTE — Progress Notes (Signed)
I agree with the above plan 

## 2019-10-30 ENCOUNTER — Telehealth: Payer: Self-pay | Admitting: Adult Health

## 2019-10-30 DIAGNOSIS — IMO0002 Reserved for concepts with insufficient information to code with codable children: Secondary | ICD-10-CM

## 2019-10-30 MED ORDER — AMITRIPTYLINE HCL 10 MG PO TABS: 10.0000 mg | ORAL_TABLET | Freq: Every day | ORAL | 3 refills | Status: DC

## 2019-10-30 NOTE — Telephone Encounter (Signed)
Recommend trialing amitriptyline 10 mg nightly to see if this is better tolerated.  A new prescription was sent to her pharmacy.  She continues to have difficulty tolerating, may consider restarting Depakote as she previously experienced benefit but did not increase dosage previously recommended.

## 2019-10-30 NOTE — Telephone Encounter (Signed)
Patient called stating that the Elavil making her very sleepy. When she takes it it will knock her out and even into the next day. She also cut it in half and it still did the same thing. It did knock the headache out but it is making her very sleepy and she was wondering if there is something else she can take that won't make her so sleepy.

## 2019-10-30 NOTE — Addendum Note (Signed)
Addended by: Mal Misty on: 10/30/2019 04:38 PM   Modules accepted: Orders

## 2019-10-31 NOTE — Telephone Encounter (Signed)
I called pt and relayed that JM/NP will try 10mg  elavil and see how tolerates.  She stated that other option depakote did not work and she did not really want to go back on that if elavil does not work.  She will let us know how things go.

## 2019-11-16 NOTE — Progress Notes (Signed)
Office Visit Note  Patient: Jeanette [name]             Date of Birth: June 04, 1950           MRN: [id number]             PCP: Bartholome Bill, MD Referring: Bartholome Bill, MD Visit Date: 11/22/2019 Occupation: retired-teacher assistant  Subjective:  Pain in joints and positive ANA.   History of Present Illness: Jeanette [name] is a 69 y.o. female seen in consultation per request of her PCP.  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  She states the pain has been getting worse over the time in her neck upper and lower back.  She has been having muscle spasms in her lower back.  She also has some discomfort in her left shoulder, right knee and her feet occasionally.  She states she has intermittent swelling in her right knee joint.  She was seen by her PCP recently and her labs showed positive ANA.  She was referred to me for further evaluation.  She denies any history of oral ulcers, nasal ulcers, malar rash, photosensitivity, Raynaud's phenomenon, lymphadenopathy.  She states she gets intermittent edema in her lower extremities.  She also has lymphedema in her left arm.  Activities of Daily Living:  Patient reports morning stiffness for 1  hour.   Patient Reports nocturnal pain.  Difficulty dressing/grooming: Denies Difficulty climbing stairs: Reports Difficulty getting out of chair: Reports Difficulty using hands for taps, buttons, cutlery, and/or writing: Reports  Review of Systems  Constitutional: Positive for fatigue. Negative for night sweats, weight gain and weight loss.  HENT: Positive for mouth dryness. Negative for mouth sores, trouble swallowing, trouble swallowing and nose dryness.   Eyes: Positive for dryness. Negative for pain, redness, itching and visual disturbance.  Respiratory: Negative for cough, shortness of  breath, apnea and difficulty breathing.   Cardiovascular: Positive for swelling in legs/feet. Negative for chest pain, palpitations, hypertension and irregular heartbeat.  Gastrointestinal: Negative for blood in stool, constipation and diarrhea.  Endocrine: Negative for increased urination.  Genitourinary: Negative for difficulty urinating, painful urination and vaginal dryness.  Musculoskeletal: Positive for arthralgias, joint pain, myalgias, morning stiffness, muscle tenderness and myalgias. Negative for joint swelling and muscle weakness.  Skin: Negative for color change, rash, hair loss, redness, skin tightness, ulcers and sensitivity to sunlight.  Allergic/Immunologic: Negative for susceptible to infections.  Neurological: Positive for numbness, headaches and memory loss. Negative for dizziness, night sweats and weakness.  Hematological: Positive for bruising/bleeding tendency. Negative for swollen glands.  Psychiatric/Behavioral: Negative for depressed mood, confusion and sleep disturbance. The patient is not nervous/anxious.     PMFS History:  Patient Active Problem List   Diagnosis Date Noted  . OSA (obstructive sleep apnea) 04/02/2019  . Wheezing 04/02/2019  . Migraine 03/19/2015  . Abnormal MRI of the head 03/19/2015  . CKD stage 3 due to type 2 diabetes mellitus ([place]) 02/24/2015  . Chronic diastolic CHF (congestive heart failure) ([place]) 02/24/2015  . Acute CVA (cerebrovascular accident) ([place])   . 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 Multiple thyroid nodules 07/18/2014  . HLD (hyperlipidemia) 03/26/2014  . Breast cancer of upper-outer quadrant of left female breast ([place]) 03/22/2014  . Anemia in neoplastic disease  03/22/2014  . Diabetes (HCC) 02/05/2014  . Thyroid nodule 02/05/2014  . H/O malignant neoplasm of breast 02/05/2014  . Essential (primary) hypertension 02/05/2014    Past Medical History:    Diagnosis Date  . Anemia   . Arthritis    "back" (07/18/2014)  . Cancer of left breast (HCC)   . Chronic back pain   . Headache   . Heart murmur    many years ago  . Hyperlipidemia   . Hypertension   . Hypothyroidism    thyroidectomy 07/18/2014  . Neuromuscular disorder (HCC)    neuropathy hands and feet  . Personal history of chemotherapy   . Personal history of radiation therapy   . Stroke (HCC)   . Type II diabetes mellitus (HCC)     Family History  Problem Relation Age of Onset  . Hypertension Father   . Diabetes Father   . Prostate cancer Father   . Hypertension Sister   . Diabetes Sister   . Breast cancer Maternal Aunt   . Hypertension Son    Past Surgical History:  Procedure Laterality Date  . ABDOMINAL HYSTERECTOMY  1970's  . APPENDECTOMY    . BILATERAL OOPHORECTOMY  1980j's  . BREAST BIOPSY Left 2008  . BUNIONECTOMY Right   . CARPAL TUNNEL RELEASE Bilateral   . MASTECTOMY Left 2008  . REDUCTION MAMMAPLASTY Right   . TEE WITHOUT CARDIOVERSION N/A 02/25/2015   Procedure: TRANSESOPHAGEAL ECHOCARDIOGRAM (TEE);  Surgeon: Quintella Reichert, MD;  Location: [facility] ENDOSCOPY;  Service: Cardiovascular;  Laterality: N/A;  . THYROIDECTOMY N/A 07/18/2014   Procedure: TOTAL THYROIDECTOMY;  Surgeon: Darnell Level, MD;  Location: [facility] OR;  Service: General;  Laterality: N/A;  . TONSILLECTOMY    . TOTAL THYROIDECTOMY  07/18/2014   Social History   Social History Narrative   Lives at home alone   Right handed   Caffeine: 1 cup coffee daily   Immunization History  Administered Date(s) Administered  . Influenza, High Dose Seasonal PF 01/12/2019  . Influenza,inj,Quad PF,6+ Mos 02/07/2015, 01/15/2016, 02/02/2017  . Influenza,inj,quad, With Preservative 03/12/2014  . Influenza-Unspecified 01/22/2014, 02/07/2015, 01/15/2016, 02/02/2017, 02/21/2018  . PFIZER SARS-COV-2 Vaccination 07/26/2019, 08/21/2019  . PPD Test 08/13/2014  . Pneumococcal Conjugate-13 08/07/2014, 02/23/2018  .  Pneumococcal Polysaccharide-23 05/25/2011, 09/15/2017  . Tdap 02/05/2014  . Zoster 08/07/2014, 11/16/2017  . Zoster Recombinat (Shingrix) 08/07/2014, 11/16/2017, 02/23/2018     Objective: Vital Signs: BP 113/73 (BP Location: Right Arm, Patient Position: Sitting, Cuff Size: Normal)   Pulse 74   Resp 16   Ht 5\' 2"  (1.575 m)   Wt 193 lb 6.4 oz (87.7 kg)   BMI 35.37 kg/m    Physical Exam Vitals and nursing note reviewed.  Constitutional:      Appearance: She is well-developed.  HENT:     Head: Normocephalic and atraumatic.  Eyes:     Conjunctiva/sclera: Conjunctivae normal.  Cardiovascular:     Rate and Rhythm: Normal rate and regular rhythm.     Heart sounds: Normal heart sounds.  Pulmonary:     Effort: Pulmonary effort is normal.     Breath sounds: Normal breath sounds.  Abdominal:     General: Bowel sounds are normal.     Palpations: Abdomen is soft.  Musculoskeletal:     Cervical back: Normal range of motion.  Lymphadenopathy:     Cervical: No cervical adenopathy.  Skin:    General: Skin is warm and dry.     Capillary Refill: Capillary refill takes less  than 2 seconds.  Neurological:     Mental Status: She is alert and oriented to person, place, and time.  Psychiatric:        Behavior: Behavior normal.      Musculoskeletal Exam: Patient has discomfort range of motion of her cervical and lumbar spine.  Shoulder joints were in good range of motion with discomfort in left shoulder joint.  Elbow joints, wrist joints, MCPs, PIPs and DIPs with good range of motion with no synovitis.  Hip joints and knee joints with good range of motion.  She is warmth on palpation of her right knee joint.  Ankle joints with good range of motion.  There was no tenderness over MTPs or PIPs.  CDAI Exam: CDAI Score: -- Patient Global: --; Provider Global: -- Swollen: --; Tender: -- Joint Exam 11/22/2019   No joint exam has been documented for this visit   There is currently no  information documented on the homunculus. Go to the Rheumatology activity and complete the homunculus joint exam.  Investigation: No additional findings.  Imaging: No results found.  Recent Labs: Lab Results  Component Value Date   WBC 6.8 07/15/2019   HGB 12.6 07/15/2019   PLT 250 07/15/2019   NA 141 07/15/2019   K 4.5 07/15/2019   CL 107 07/15/2019   CO2 27 07/15/2019   GLUCOSE 73 07/15/2019   BUN 24 (H) 07/15/2019   CREATININE 1.63 (H) 07/15/2019   BILITOT 1.10 03/22/2014   ALKPHOS 58 03/22/2014   AST 18 03/22/2014   ALT 20 03/22/2014   PROT 7.0 03/22/2014   ALBUMIN 4.1 03/22/2014   CALCIUM 8.8 (L) 07/15/2019   GFRAA 37 (L) 07/15/2019    Speciality Comments: No specialty comments available.  Procedures:  No procedures performed Allergies: Topamax [topiramate]   Assessment / Plan:     Visit Diagnoses: Positive ANA (antinuclear antibody) - 07/30/19: ANA 1:320 speckled, CRP<5, ESR 8, CK 162, Smith-, Ribosome P Ab- -she has positive ANA but no clinical features of autoimmune disease.  I will repeat ANA and obtain additional labs.  Plan: ANA, Anti-scleroderma antibody, RNP Antibody, Anti-Smith antibody, Sjogrens syndrome-A extractable nuclear antibody, Sjogrens syndrome-B extractable nuclear antibody, Anti-DNA antibody, double-stranded, C3 and C4  Chronic left shoulder pain -patient gives history of left shoulder joint pain for many years.  She has discomfort range of motion of her left shoulder.  Plan: XR Shoulder Left.  Acromioclavicular arthritis was noted.  Chronic pain of right knee -she has warmth and discomfort in her right knee joint.  Plan: XR KNEE 3 VIEW RIGHT, moderate osteoarthritis and moderate chondromalacia patella was noted.  Uric acid, Rheumatoid factor, Cyclic citrul peptide antibody, IgG, 14-3-3 eta Protein, Angiotensin converting enzyme  Neck pain -she has discomfort range of motion of her cervical spine.  Plan: XR Cervical Spine 2 or 3 views.  Multilevel  spondylosis and facet joint arthropathy was noted.  Chronic midline low back pain without sciatica -she has discomfort range of motion of her lumbar spine.  Plan: XR Lumbar Spine 2-3 Views.  Multilevel spondylosis and facet joint arthropathy was noted.  Other medical problems are listed as follows:  Fasciculations of muscle  Essential (primary) hypertension  Acute CVA (cerebrovascular accident) ([place])  Chronic diastolic CHF (congestive heart failure) ([place])  History of hyperlipidemia  Diabetes mellitus due to underlying condition with hyperosmolarity without coma, without long-term current use of insulin ([place])  CKD stage 3 due to type 2 diabetes mellitus (HCC)  OSA (obstructive sleep apnea)  Thyroid nodule  Hypothyroidism, postsurgical  Malignant neoplasm of upper-outer quadrant of left breast in female, estrogen receptor positive ([place]) - 2008,Left mastectomy, RTX and CTX.   Orders: Orders Placed This Encounter  Procedures  . XR Cervical Spine 2 or 3 views  . XR Lumbar Spine 2-3 Views  . XR KNEE 3 VIEW RIGHT  . XR Shoulder Left  . Uric acid  . Rheumatoid factor  . Cyclic citrul peptide antibody, IgG  . 14-3-3 eta Protein  . Angiotensin converting enzyme  . ANA  . Anti-scleroderma antibody  . RNP Antibody  . Anti-Smith antibody  . Sjogrens syndrome-A extractable nuclear antibody  . Sjogrens syndrome-B extractable nuclear antibody  . Anti-DNA antibody, double-stranded  . C3 and C4   No orders of the defined types were placed in this encounter.     Follow-Up Instructions: Return for Pain in multiple joints, positive ANA.   Bo Merino, MD  Note - This record has been created using Editor, commissioning.  Chart creation errors have been sought, but may not always  have been located. Such creation errors do not reflect on  the standard of medical care.

## 2019-11-22 ENCOUNTER — Ambulatory Visit: Payer: Self-pay

## 2019-11-22 ENCOUNTER — Encounter: Payer: Self-pay | Admitting: Rheumatology

## 2019-11-22 ENCOUNTER — Other Ambulatory Visit: Payer: Self-pay

## 2019-11-22 ENCOUNTER — Ambulatory Visit (INDEPENDENT_AMBULATORY_CARE_PROVIDER_SITE_OTHER): Payer: Medicare HMO | Admitting: Rheumatology

## 2019-11-22 VITALS — BP 113/73 | HR 74 | Resp 16 | Ht 62.0 in | Wt 193.4 lb

## 2019-11-22 DIAGNOSIS — G4733 Obstructive sleep apnea (adult) (pediatric): Secondary | ICD-10-CM

## 2019-11-22 DIAGNOSIS — E890 Postprocedural hypothyroidism: Secondary | ICD-10-CM

## 2019-11-22 DIAGNOSIS — I639 Cerebral infarction, unspecified: Secondary | ICD-10-CM

## 2019-11-22 DIAGNOSIS — M25512 Pain in left shoulder: Secondary | ICD-10-CM

## 2019-11-22 DIAGNOSIS — N183 Chronic kidney disease, stage 3 unspecified: Secondary | ICD-10-CM

## 2019-11-22 DIAGNOSIS — C50412 Malignant neoplasm of upper-outer quadrant of left female breast: Secondary | ICD-10-CM

## 2019-11-22 DIAGNOSIS — R253 Fasciculation: Secondary | ICD-10-CM

## 2019-11-22 DIAGNOSIS — R768 Other specified abnormal immunological findings in serum: Secondary | ICD-10-CM | POA: Diagnosis not present

## 2019-11-22 DIAGNOSIS — I10 Essential (primary) hypertension: Secondary | ICD-10-CM

## 2019-11-22 DIAGNOSIS — E041 Nontoxic single thyroid nodule: Secondary | ICD-10-CM

## 2019-11-22 DIAGNOSIS — Z8639 Personal history of other endocrine, nutritional and metabolic disease: Secondary | ICD-10-CM

## 2019-11-22 DIAGNOSIS — G8929 Other chronic pain: Secondary | ICD-10-CM

## 2019-11-22 DIAGNOSIS — M542 Cervicalgia: Secondary | ICD-10-CM

## 2019-11-22 DIAGNOSIS — M545 Low back pain: Secondary | ICD-10-CM | POA: Diagnosis not present

## 2019-11-22 DIAGNOSIS — Z170 Estrogen receptor positive status [ER+]: Secondary | ICD-10-CM

## 2019-11-22 DIAGNOSIS — E0800 Diabetes mellitus due to underlying condition with hyperosmolarity without nonketotic hyperglycemic-hyperosmolar coma (NKHHC): Secondary | ICD-10-CM

## 2019-11-22 DIAGNOSIS — E1122 Type 2 diabetes mellitus with diabetic chronic kidney disease: Secondary | ICD-10-CM

## 2019-11-22 DIAGNOSIS — I5032 Chronic diastolic (congestive) heart failure: Secondary | ICD-10-CM

## 2019-11-22 DIAGNOSIS — M25561 Pain in right knee: Secondary | ICD-10-CM | POA: Diagnosis not present

## 2019-11-22 NOTE — Patient Instructions (Signed)
Journal for Nurse Practitioners, 15(4), 263-267. Retrieved February 27, 2018 from [email or web address]">  Knee Exercises Ask your health care provider which exercises are safe for you. Do exercises exactly as told by your health care provider and adjust them as directed. It is normal to feel mild stretching, pulling, tightness, or discomfort as you do these exercises. Stop right away if you feel sudden pain or your pain gets worse. Do not begin these exercises until told by your health care provider. Stretching and range-of-motion exercises These exercises warm up your muscles and joints and improve the movement and flexibility of your knee. These exercises also help to relieve pain and swelling. Knee extension, prone 1. Lie on your abdomen (prone position) on a bed. 2. Place your left / right knee just beyond the edge of the surface so your knee is not on the bed. You can put a towel under your left / right thigh just above your kneecap for comfort. 3. Relax your leg muscles and allow gravity to straighten your knee (extension). You should feel a stretch behind your left / right knee. 4. Hold this position for __________ seconds. 5. Scoot up so your knee is supported between repetitions. Repeat __________ times. Complete this exercise __________ times a day. Knee flexion, active  1. Lie on your back with both legs straight. If this causes back discomfort, bend your left / right knee so your foot is flat on the floor. 2. Slowly slide your left / right heel back toward your buttocks. Stop when you feel a gentle stretch in the front of your knee or thigh (flexion). 3. Hold this position for __________ seconds. 4. Slowly slide your left / right heel back to the starting position. Repeat __________ times. Complete this exercise __________ times a day. Quadriceps stretch, prone  1. Lie on your abdomen on a firm surface, such as a bed or padded floor. 2. Bend your left / right knee and hold  your ankle. If you cannot reach your ankle or pant leg, loop a belt around your foot and grab the belt instead. 3. Gently pull your heel toward your buttocks. Your knee should not slide out to the side. You should feel a stretch in the front of your thigh and knee (quadriceps). 4. Hold this position for __________ seconds. Repeat __________ times. Complete this exercise __________ times a day. Hamstring, supine 1. Lie on your back (supine position). 2. Loop a belt or towel over the ball of your left / right foot. The ball of your foot is on the walking surface, right under your toes. 3. Straighten your left / right knee and slowly pull on the belt to raise your leg until you feel a gentle stretch behind your knee (hamstring). ? Do not let your knee bend while you do this. ? Keep your other leg flat on the floor. 4. Hold this position for __________ seconds. Repeat __________ times. Complete this exercise __________ times a day. Strengthening exercises These exercises build strength and endurance in your knee. Endurance is the ability to use your muscles for a long time, even after they get tired. Quadriceps, isometric This exercise stretches the muscles in front of your thigh (quadriceps) without moving your knee joint (isometric). 1. Lie on your back with your left / right leg extended and your other knee bent. Put a rolled towel or small pillow under your knee if told by your health care provider. 2. Slowly tense the muscles in the front of your left /   right thigh. You should see your kneecap slide up toward your hip or see increased dimpling just above the knee. This motion will push the back of the knee toward the floor. 3. For __________ seconds, hold the muscle as tight as you can without increasing your pain. 4. Relax the muscles slowly and completely. Repeat __________ times. Complete this exercise __________ times a day. Straight leg raises This exercise stretches the muscles in front  of your thigh (quadriceps) and the muscles that move your hips (hip flexors). 1. Lie on your back with your left / right leg extended and your other knee bent. 2. Tense the muscles in the front of your left / right thigh. You should see your kneecap slide up or see increased dimpling just above the knee. Your thigh may even shake a bit. 3. Keep these muscles tight as you raise your leg 4-6 inches (10-15 cm) off the floor. Do not let your knee bend. 4. Hold this position for __________ seconds. 5. Keep these muscles tense as you lower your leg. 6. Relax your muscles slowly and completely after each repetition. Repeat __________ times. Complete this exercise __________ times a day. Hamstring, isometric 1. Lie on your back on a firm surface. 2. Bend your left / right knee about __________ degrees. 3. Dig your left / right heel into the surface as if you are trying to pull it toward your buttocks. Tighten the muscles in the back of your thighs (hamstring) to "dig" as hard as you can without increasing any pain. 4. Hold this position for __________ seconds. 5. Release the tension gradually and allow your muscles to relax completely for __________ seconds after each repetition. Repeat __________ times. Complete this exercise __________ times a day. Hamstring curls If told by your health care provider, do this exercise while wearing ankle weights. Begin with __________ lb weights. Then increase the weight by 1 lb (0.5 kg) increments. Do not wear ankle weights that are more than __________ lb. 1. Lie on your abdomen with your legs straight. 2. Bend your left / right knee as far as you can without feeling pain. Keep your hips flat against the floor. 3. Hold this position for __________ seconds. 4. Slowly lower your leg to the starting position. Repeat __________ times. Complete this exercise __________ times a day. Squats This exercise strengthens the muscles in front of your thigh and knee  (quadriceps). 1. Stand in front of a table, with your feet and knees pointing straight ahead. You may rest your hands on the table for balance but not for support. 2. Slowly bend your knees and lower your hips like you are going to sit in a chair. ? Keep your weight over your heels, not over your toes. ? Keep your lower legs upright so they are parallel with the table legs. ? Do not let your hips go lower than your knees. ? Do not bend lower than told by your health care provider. ? If your knee pain increases, do not bend as low. 3. Hold the squat position for __________ seconds. 4. Slowly push with your legs to return to standing. Do not use your hands to pull yourself to standing. Repeat __________ times. Complete this exercise __________ times a day. Wall slides This exercise strengthens the muscles in front of your thigh and knee (quadriceps). 1. Lean your back against a smooth wall or door, and walk your feet out 18-24 inches (46-61 cm) from it. 2. Place your feet hip-width apart. 3.   Slowly slide down the wall or door until your knees bend __________ degrees. Keep your knees over your heels, not over your toes. Keep your knees in line with your hips. 4. Hold this position for __________ seconds. Repeat __________ times. Complete this exercise __________ times a day. Straight leg raises This exercise strengthens the muscles that rotate the leg at the hip and move it away from your body (hip abductors). 1. Lie on your side with your left / right leg in the top position. Lie so your head, shoulder, knee, and hip line up. You may bend your bottom knee to help you keep your balance. 2. Roll your hips slightly forward so your hips are stacked directly over each other and your left / right knee is facing forward. 3. Leading with your heel, lift your top leg 4-6 inches (10-15 cm). You should feel the muscles in your outer hip lifting. ? Do not let your foot drift forward. ? Do not let your knee  roll toward the ceiling. 4. Hold this position for __________ seconds. 5. Slowly return your leg to the starting position. 6. Let your muscles relax completely after each repetition. Repeat __________ times. Complete this exercise __________ times a day. Straight leg raises This exercise stretches the muscles that move your hips away from the front of the pelvis (hip extensors). 1. Lie on your abdomen on a firm surface. You can put a pillow under your hips if that is more comfortable. 2. Tense the muscles in your buttocks and lift your left / right leg about 4-6 inches (10-15 cm). Keep your knee straight as you lift your leg. 3. Hold this position for __________ seconds. 4. Slowly lower your leg to the starting position. 5. Let your leg relax completely after each repetition. Repeat __________ times. Complete this exercise __________ times a day. This information is not intended to replace advice given to you by your health care provider. Make sure you discuss any questions you have with your health care provider. Document Revised: 02/28/2018 Document Reviewed: 02/28/2018 [employer] Patient Education  2020 [employer] Shoulder Exercises Ask your health care provider which exercises are safe for you. Do exercises exactly as told by your health care provider and adjust them as directed. It is normal to feel mild stretching, pulling, tightness, or discomfort as you do these exercises. Stop right away if you feel sudden pain or your pain gets worse. Do not begin these exercises until told by your health care provider. Stretching exercises External rotation and abduction This exercise is sometimes called corner stretch. This exercise rotates your arm outward (external rotation) and moves your arm out from your body (abduction). 6. Stand in a doorway with one of your feet slightly in front of the other. This is called a staggered stance. If you cannot reach your forearms to the door frame, stand  facing a corner of a room. 7. Choose one of the following positions as told by your health care provider: ? Place your hands and forearms on the door frame above your head. ? Place your hands and forearms on the door frame at the height of your head. ? Place your hands on the door frame at the height of your elbows. 8. Slowly move your weight onto your front foot until you feel a stretch across your chest and in the front of your shoulders. Keep your head and chest upright and keep your abdominal muscles tight. 9. Hold for __________ seconds. 10. To release the stretch,   shift your weight to your back foot. Repeat __________ times. Complete this exercise __________ times a day. Extension, standing 5. Stand and hold a broomstick, a cane, or a similar object behind your back. ? Your hands should be a little wider than shoulder width apart. ? Your palms should face away from your back. 6. Keeping your elbows straight and your shoulder muscles relaxed, move the stick away from your body until you feel a stretch in your shoulders (extension). ? Avoid shrugging your shoulders while you move the stick. Keep your shoulder blades tucked down toward the middle of your back. 7. Hold for __________ seconds. 8. Slowly return to the starting position. Repeat __________ times. Complete this exercise __________ times a day. Range-of-motion exercises Pendulum  5. Stand near a wall or a surface that you can hold onto for balance. 6. Bend at the waist and let your left / right arm hang straight down. Use your other arm to support you. Keep your back straight and do not lock your knees. 7. Relax your left / right arm and shoulder muscles, and move your hips and your trunk so your left / right arm swings freely. Your arm should swing because of the motion of your body, not because you are using your arm or shoulder muscles. 8. Keep moving your hips and trunk so your arm swings in the following directions, as told  by your health care provider: ? Side to side. ? Forward and backward. ? In clockwise and counterclockwise circles. 9. Continue each motion for __________ seconds, or for as long as told by your health care provider. 10. Slowly return to the starting position. Repeat __________ times. Complete this exercise __________ times a day. Shoulder flexion, standing  5. Stand and hold a broomstick, a cane, or a similar object. Place your hands a little more than shoulder width apart on the object. Your left / right hand should be palm up, and your other hand should be palm down. 6. Keep your elbow straight and your shoulder muscles relaxed. Push the stick up with your healthy arm to raise your left / right arm in front of your body, and then over your head until you feel a stretch in your shoulder (flexion). ? Avoid shrugging your shoulder while you raise your arm. Keep your shoulder blade tucked down toward the middle of your back. 7. Hold for __________ seconds. 8. Slowly return to the starting position. Repeat __________ times. Complete this exercise __________ times a day. Shoulder abduction, standing 5. Stand and hold a broomstick, a cane, or a similar object. Place your hands a little more than shoulder width apart on the object. Your left / right hand should be palm up, and your other hand should be palm down. 6. Keep your elbow straight and your shoulder muscles relaxed. Push the object across your body toward your left / right side. Raise your left / right arm to the side of your body (abduction) until you feel a stretch in your shoulder. ? Do not raise your arm above shoulder height unless your health care provider tells you to do that. ? If directed, raise your arm over your head. ? Avoid shrugging your shoulder while you raise your arm. Keep your shoulder blade tucked down toward the middle of your back. 7. Hold for __________ seconds. 8. Slowly return to the starting position. Repeat  __________ times. Complete this exercise __________ times a day. Internal rotation  7. Place your left / right hand behind   your back, palm up. 8. Use your other hand to dangle an exercise band, a towel, or a similar object over your shoulder. Grasp the band with your left / right hand so you are holding on to both ends. 9. Gently pull up on the band until you feel a stretch in the front of your left / right shoulder. The movement of your arm toward the center of your body is called internal rotation. ? Avoid shrugging your shoulder while you raise your arm. Keep your shoulder blade tucked down toward the middle of your back. 10. Hold for __________ seconds. 11. Release the stretch by letting go of the band and lowering your hands. Repeat __________ times. Complete this exercise __________ times a day. Strengthening exercises External rotation  6. Sit in a stable chair without armrests. 7. Secure an exercise band to a stable object at elbow height on your left / right side. 8. Place a soft object, such as a folded towel or a small pillow, between your left / right upper arm and your body to move your elbow about 4 inches (10 cm) away from your side. 9. Hold the end of the exercise band so it is tight and there is no slack. 10. Keeping your elbow pressed against the soft object, slowly move your forearm out, away from your abdomen (external rotation). Keep your body steady so only your forearm moves. 11. Hold for __________ seconds. 12. Slowly return to the starting position. Repeat __________ times. Complete this exercise __________ times a day. Shoulder abduction  5. Sit in a stable chair without armrests, or stand up. 6. Hold a __________ weight in your left / right hand, or hold an exercise band with both hands. 7. Start with your arms straight down and your left / right palm facing in, toward your body. 8. Slowly lift your left / right hand out to your side (abduction). Do not lift your  hand above shoulder height unless your health care provider tells you that this is safe. ? Keep your arms straight. ? Avoid shrugging your shoulder while you do this movement. Keep your shoulder blade tucked down toward the middle of your back. 9. Hold for __________ seconds. 10. Slowly lower your arm, and return to the starting position. Repeat __________ times. Complete this exercise __________ times a day. Shoulder extension 5. Sit in a stable chair without armrests, or stand up. 6. Secure an exercise band to a stable object in front of you so it is at shoulder height. 7. Hold one end of the exercise band in each hand. Your palms should face each other. 8. Straighten your elbows and lift your hands up to shoulder height. 9. Step back, away from the secured end of the exercise band, until the band is tight and there is no slack. 10. Squeeze your shoulder blades together as you pull your hands down to the sides of your thighs (extension). Stop when your hands are straight down by your sides. Do not let your hands go behind your body. 11. Hold for __________ seconds. 12. Slowly return to the starting position. Repeat __________ times. Complete this exercise __________ times a day. Shoulder row 5. Sit in a stable chair without armrests, or stand up. 6. Secure an exercise band to a stable object in front of you so it is at waist height. 7. Hold one end of the exercise band in each hand. Position your palms so that your thumbs are facing the ceiling (neutral position). 8.   Bend each of your elbows to a 90-degree angle (right angle) and keep your upper arms at your sides. 9. Step back until the band is tight and there is no slack. 10. Slowly pull your elbows back behind you. 11. Hold for __________ seconds. 12. Slowly return to the starting position. Repeat __________ times. Complete this exercise __________ times a day. Shoulder press-ups  7. Sit in a stable chair that has armrests. Sit  upright, with your feet flat on the floor. 8. Put your hands on the armrests so your elbows are bent and your fingers are pointing forward. Your hands should be about even with the sides of your body. 9. Push down on the armrests and use your arms to lift yourself off the chair. Straighten your elbows and lift yourself up as much as you comfortably can. ? Move your shoulder blades down, and avoid letting your shoulders move up toward your ears. ? Keep your feet on the ground. As you get stronger, your feet should support less of your body weight as you lift yourself up. 10. Hold for __________ seconds. 11. Slowly lower yourself back into the chair. Repeat __________ times. Complete this exercise __________ times a day. Wall push-ups  6. Stand so you are facing a stable wall. Your feet should be about one arm-length away from the wall. 7. Lean forward and place your palms on the wall at shoulder height. 8. Keep your feet flat on the floor as you bend your elbows and lean forward toward the wall. 9. Hold for __________ seconds. 10. Straighten your elbows to push yourself back to the starting position. Repeat __________ times. Complete this exercise __________ times a day. This information is not intended to replace advice given to you by your health care provider. Make sure you discuss any questions you have with your health care provider. Document Revised: 09/01/2018 Document Reviewed: 06/09/2018 [employer] Patient Education  [place]. Back Exercises The following exercises strengthen the muscles that help to support the trunk and back. They also help to keep the lower back flexible. Doing these exercises can help to prevent back pain or lessen existing pain.  If you have back pain or discomfort, try doing these exercises 2-3 times each day or as told by your health care provider.  As your pain improves, do them once each day, but increase the number of times that you repeat the steps  for each exercise (do more repetitions).  To prevent the recurrence of back pain, continue to do these exercises once each day or as told by your health care provider. Do exercises exactly as told by your health care provider and adjust them as directed. It is normal to feel mild stretching, pulling, tightness, or discomfort as you do these exercises, but you should stop right away if you feel sudden pain or your pain gets worse. Exercises Single knee to chest Repeat these steps 3-5 times for each leg: 1. Lie on your back on a firm bed or the floor with your legs extended. 2. Bring one knee to your chest. Your other leg should stay extended and in contact with the floor. 3. Hold your knee in place by grabbing your knee or thigh with both hands and hold. 4. Pull on your knee until you feel a gentle stretch in your lower back or buttocks. 5. Hold the stretch for 10-30 seconds. 6. Slowly release and straighten your leg. Pelvic tilt Repeat these steps 5-10 times: 1. Lie on your back  on a firm bed or the floor with your legs extended. 2. Bend your knees so they are pointing toward the ceiling and your feet are flat on the floor. 3. Tighten your lower abdominal muscles to press your lower back against the floor. This motion will tilt your pelvis so your tailbone points up toward the ceiling instead of pointing to your feet or the floor. 4. With gentle tension and even breathing, hold this position for 5-10 seconds. Cat-cow Repeat these steps until your lower back becomes more flexible: 1. Get into a hands-and-knees position on a firm surface. Keep your hands under your shoulders, and keep your knees under your hips. You may place padding under your knees for comfort. 2. Let your head hang down toward your chest. Contract your abdominal muscles and point your tailbone toward the floor so your lower back becomes rounded like the back of a cat. 3. Hold this position for 5 seconds. 4. Slowly lift your  head, let your abdominal muscles relax and point your tailbone up toward the ceiling so your back forms a sagging arch like the back of a cow. 5. Hold this position for 5 seconds.  Press-ups Repeat these steps 5-10 times: 1. Lie on your abdomen (face-down) on the floor. 2. Place your palms near your head, about shoulder-width apart. 3. Keeping your back as relaxed as possible and keeping your hips on the floor, slowly straighten your arms to raise the top half of your body and lift your shoulders. Do not use your back muscles to raise your upper torso. You may adjust the placement of your hands to make yourself more comfortable. 4. Hold this position for 5 seconds while you keep your back relaxed. 5. Slowly return to lying flat on the floor.  Bridges Repeat these steps 10 times: 1. Lie on your back on a firm surface. 2. Bend your knees so they are pointing toward the ceiling and your feet are flat on the floor. Your arms should be flat at your sides, next to your body. 3. Tighten your buttocks muscles and lift your buttocks off the floor until your waist is at almost the same height as your knees. You should feel the muscles working in your buttocks and the back of your thighs. If you do not feel these muscles, slide your feet 1-2 inches farther away from your buttocks. 4. Hold this position for 3-5 seconds. 5. Slowly lower your hips to the starting position, and allow your buttocks muscles to relax completely. If this exercise is too easy, try doing it with your arms crossed over your chest. Abdominal crunches Repeat these steps 5-10 times: 1. Lie on your back on a firm bed or the floor with your legs extended. 2. Bend your knees so they are pointing toward the ceiling and your feet are flat on the floor. 3. Cross your arms over your chest. 4. Tip your chin slightly toward your chest without bending your neck. 5. Tighten your abdominal muscles and slowly raise your trunk (torso) high enough  to lift your shoulder blades a tiny bit off the floor. Avoid raising your torso higher than that because it can put too much stress on your low back and does not help to strengthen your abdominal muscles. 6. Slowly return to your starting position. Back lifts Repeat these steps 5-10 times: 1. Lie on your abdomen (face-down) with your arms at your sides, and rest your forehead on the floor. 2. Tighten the muscles in your legs  and your buttocks. 3. Slowly lift your chest off the floor while you keep your hips pressed to the floor. Keep the back of your head in line with the curve in your back. Your eyes should be looking at the floor. 4. Hold this position for 3-5 seconds. 5. Slowly return to your starting position. Contact a health care provider if:  Your back pain or discomfort gets much worse when you do an exercise.  Your worsening back pain or discomfort does not lessen within 2 hours after you exercise. If you have any of these problems, stop doing these exercises right away. Do not do them again unless your health care provider says that you can. Get help right away if:  You develop sudden, severe back pain. If this happens, stop doing the exercises right away. Do not do them again unless your health care provider says that you can. This information is not intended to replace advice given to you by your health care provider. Make sure you discuss any questions you have with your health care provider. Document Revised: 09/14/2018 Document Reviewed: 02/09/2018 Elsevier Patient Education  [place]. Cervical Strain and Sprain Rehab Ask your health care provider which exercises are safe for you. Do exercises exactly as told by your health care provider and adjust them as directed. It is normal to feel mild stretching, pulling, tightness, or discomfort as you do these exercises. Stop right away if you feel sudden pain or your pain gets worse. Do not begin these exercises until told by  your health care provider. Stretching and range-of-motion exercises Cervical side bending  11. Using good posture, sit on a stable chair or stand up. 12. Without moving your shoulders, slowly tilt your left / right ear to your shoulder until you feel a stretch in the opposite side neck muscles. You should be looking straight ahead. 13. Hold for __________ seconds. 14. Repeat with the other side of your neck. Repeat __________ times. Complete this exercise __________ times a day. Cervical rotation  9. Using good posture, sit on a stable chair or stand up. 10. Slowly turn your head to the side as if you are looking over your left / right shoulder. ? Keep your eyes level with the ground. ? Stop when you feel a stretch along the side and the back of your neck. 11. Hold for __________ seconds. 12. Repeat this by turning to your other side. Repeat __________ times. Complete this exercise __________ times a day. Thoracic extension and pectoral stretch 11. Roll a towel or a small blanket so it is about 4 inches (10 cm) in diameter. 12. Lie down on your back on a firm surface. 13. Put the towel lengthwise, under your spine in the middle of your back. It should not be under your shoulder blades. The towel should line up with your spine from your middle back to your lower back. 14. Put your hands behind your head and let your elbows fall out to your sides. 15. Hold for __________ seconds. Repeat __________ times. Complete this exercise __________ times a day. Strengthening exercises Isometric upper cervical flexion 9. Lie on your back with a thin pillow behind your head and a small rolled-up towel under your neck. 10. Gently tuck your chin toward your chest and nod your head down to look toward your feet. Do not lift your head off the pillow. 11. Hold for __________ seconds. 12. Release the tension slowly. Relax your neck muscles completely before you repeat this  exercise. Repeat __________  times. Complete this exercise __________ times a day. Isometric cervical extension  9. Stand about 6 inches (15 cm) away from a wall, with your back facing the wall. 10. Place a soft object, about 6-8 inches (15-20 cm) in diameter, between the back of your head and the wall. A soft object could be a small pillow, a ball, or a folded towel. 11. Gently tilt your head back and press into the soft object. Keep your jaw and forehead relaxed. 12. Hold for __________ seconds. 13. Release the tension slowly. Relax your neck muscles completely before you repeat this exercise. Repeat __________ times. Complete this exercise __________ times a day. Posture and body mechanics Body mechanics refers to the movements and positions of your body while you do your daily activities. Posture is part of body mechanics. Good posture and healthy body mechanics can help to relieve stress in your body's tissues and joints. Good posture means that your spine is in its natural S-curve position (your spine is neutral), your shoulders are pulled back slightly, and your head is not tipped forward. The following are general guidelines for applying improved posture and body mechanics to your everyday activities. Sitting  12. When sitting, keep your spine neutral and keep your feet flat on the floor. Use a footrest, if necessary, and keep your thighs parallel to the floor. Avoid rounding your shoulders, and avoid tilting your head forward. 13. When working at a desk or a computer, keep your desk at a height where your hands are slightly lower than your elbows. Slide your chair under your desk so you are close enough to maintain good posture. 14. When working at a computer, place your monitor at a height where you are looking straight ahead and you do not have to tilt your head forward or downward to look at the screen. Standing   When standing, keep your spine neutral and keep your feet about hip-width apart. Keep a slight bend  in your knees. Your ears, shoulders, and hips should line up.  When you do a task in which you stand in one place for a long time, place one foot up on a stable object that is 2-4 inches (5-10 cm) high, such as a footstool. This helps keep your spine neutral. Resting When lying down and resting, avoid positions that are most painful for you. Try to support your neck in a neutral position. You can use a contour pillow or a small rolled-up towel. Your pillow should support your neck but not push on it. This information is not intended to replace advice given to you by your health care provider. Make sure you discuss any questions you have with your health care provider. Document Revised: 08/30/2018 Document Reviewed: 02/08/2018 Elsevier Patient Education  [place].

## 2019-11-23 ENCOUNTER — Other Ambulatory Visit: Payer: Self-pay | Admitting: Adult Health

## 2019-11-23 DIAGNOSIS — IMO0002 Reserved for concepts with insufficient information to code with codable children: Secondary | ICD-10-CM

## 2019-11-25 NOTE — Progress Notes (Signed)
I will discuss results at the follow-up visit.

## 2019-11-27 ENCOUNTER — Telehealth: Payer: Self-pay | Admitting: Adult Health

## 2019-11-27 DIAGNOSIS — IMO0002 Reserved for concepts with insufficient information to code with codable children: Secondary | ICD-10-CM

## 2019-11-27 MED ORDER — AMITRIPTYLINE HCL 10 MG PO TABS: 10.0000 mg | ORAL_TABLET | Freq: Every day | ORAL | 1 refills | Status: DC

## 2019-11-27 NOTE — Addendum Note (Signed)
Addended by: Verlin Grills on: 11/27/2019 12:10 PM   Modules accepted: Orders

## 2019-11-27 NOTE — Telephone Encounter (Signed)
I reached out to the pt to clarify this message. Pts sts she needs her Amitriptyline to be sent to [facility] pt would like to start receiving her refill from this pharmacy.  E-script sent to [facility]. Pt advised to call back if she has any issues receiving her med.

## 2019-11-27 NOTE — Telephone Encounter (Signed)
Patient called stating that [employer] needs our office to call them to make sure it is okay for the patient to be on Amitriptyline. Their phone number is [phone].

## 2019-11-30 LAB — RHEUMATOID FACTOR: Rheumatoid fact SerPl-aCnc: <14 IU/mL (ref <14)

## 2019-11-30 LAB — C3 AND C4
C3 Complement: 146 mg/dL (ref 83–193)
C4 Complement: 36 mg/dL (ref 15–57)

## 2019-11-30 LAB — ANTI-SCLERODERMA ANTIBODY: Scleroderma (Scl-70) (ENA) Antibody, IgG: <1 AI

## 2019-11-30 LAB — ANGIOTENSIN CONVERTING ENZYME: Angiotensin-Converting Enzyme: 19 U/L (ref 9–67)

## 2019-11-30 LAB — 14-3-3 ETA PROTEIN: 14-3-3 eta Protein: <0.2 ng/mL (ref <0.2)

## 2019-11-30 LAB — CYCLIC CITRUL PEPTIDE ANTIBODY, IGG: Cyclic Citrullin Peptide Ab: <16 UNITS

## 2019-11-30 LAB — SJOGRENS SYNDROME-B EXTRACTABLE NUCLEAR ANTIBODY: SSB (La) (ENA) Antibody, IgG: <1 AI

## 2019-11-30 LAB — SJOGRENS SYNDROME-A EXTRACTABLE NUCLEAR ANTIBODY: SSA (Ro) (ENA) Antibody, IgG: <1 AI

## 2019-11-30 LAB — ANTI-DNA ANTIBODY, DOUBLE-STRANDED: ds DNA Ab: 1 IU/mL

## 2019-11-30 LAB — ANA: Anti Nuclear Antibody (ANA): NEGATIVE

## 2019-11-30 LAB — URIC ACID: Uric Acid, Serum: 5.2 mg/dL (ref 2.5–7.0)

## 2019-11-30 LAB — ANTI-SMITH ANTIBODY: ENA SM Ab Ser-aCnc: <1 AI

## 2019-11-30 LAB — RNP ANTIBODY: Ribonucleic Protein(ENA) Antibody, IgG: <1 AI

## 2019-12-13 ENCOUNTER — Encounter: Payer: Self-pay | Admitting: Pulmonary Disease

## 2019-12-13 ENCOUNTER — Ambulatory Visit (INDEPENDENT_AMBULATORY_CARE_PROVIDER_SITE_OTHER): Payer: Medicare HMO | Admitting: Pulmonary Disease

## 2019-12-13 ENCOUNTER — Other Ambulatory Visit: Payer: Self-pay

## 2019-12-13 VITALS — BP 122/86 | HR 68 | Temp 98.0°F | Ht 62.0 in | Wt 192.0 lb

## 2019-12-13 DIAGNOSIS — G4733 Obstructive sleep apnea (adult) (pediatric): Secondary | ICD-10-CM | POA: Diagnosis not present

## 2019-12-13 NOTE — Patient Instructions (Addendum)
You were seen today by Lauraine Rinne, NP  for:   1. OSA (obstructive sleep apnea)  We will send a message to [facility] today to see if there is any other suitable mask options that could be available for you as it appears that you are having allergic reaction to the seal of the mask  They should be contacting you  If they have not contacted you within the next 7 days please contact our office by either sending a MyChart message or calling to let us know so we can follow-up  We recommend that you continue using your CPAP daily >>>Keep up the hard work using your device >>> Goal should be wearing this for the entire night that you are sleeping, at least 4 to 6 hours  Remember:  . Do not drive or operate heavy machinery if tired or drowsy.  . Please notify the supply company and office if you are unable to use your device regularly due to missing supplies or machine being broken.  . Work on maintaining a healthy weight and following your recommended nutrition plan  . Maintain proper daily exercise and movement  . Maintaining proper use of your device can also help improve management of other chronic illnesses such as: Blood pressure, blood sugars, and weight management.   BiPAP/ CPAP Cleaning:  >>>Clean weekly, with Dawn soap, and bottle brush.  Set up to air dry. >>> Wipe mask out daily with wet wipe or towelette    Follow Up:    Return in about 1 year (around 12/12/2020), or if symptoms worsen or fail to improve, for Follow up with Dr. Elsworth Soho.   Please do your part to reduce the spread of COVID-19:      Reduce your risk of any infection  and COVID19 by using the similar precautions used for avoiding the common cold or flu:  Marland Kitchen Wash your hands often with soap and warm water for at least 20 seconds.  If soap and water are not readily available, use an alcohol-based hand sanitizer with at least 60% alcohol.  . If coughing or sneezing, cover your mouth and nose by coughing or sneezing  into the elbow areas of your shirt or coat, into a tissue or into your sleeve (not your hands). Langley Gauss A MASK when in public  . Avoid shaking hands with others and consider head nods or verbal greetings only. . Avoid touching your eyes, nose, or mouth with unwashed hands.  . Avoid close contact with people who are sick. . Avoid places or events with large numbers of people in one location, like concerts or sporting events. . If you have some symptoms but not all symptoms, continue to monitor at home and seek medical attention if your symptoms worsen. . If you are having a medical emergency, call 911.   [facility] / e-Visit: [email or web address]         [facility]: [facility]: [phone]                   [facility]: [phone]     It is flu season:   >>> Best ways to protect herself from the flu: Receive the yearly flu vaccine, practice good hand hygiene washing with soap and also using hand sanitizer when available, eat a nutritious meals, get adequate rest, hydrate appropriately   Please contact the office if your symptoms worsen or you  have concerns that you are not improving.   Thank you for choosing [facility] for your healthcare, and for allowing [place] to partner with you on your healthcare journey. I am thankful to be able to provide care to you today.   Wyn Quaker FNP-C

## 2019-12-13 NOTE — Assessment & Plan Note (Signed)
Plan: Continue CPAP therapy We will send message to [facility] to see if they can find an alternative mask due to your skin irritation We will see you back in 6 to 12 months or sooner if you are having any issues with using your CPAP

## 2019-12-13 NOTE — Progress Notes (Signed)
@Patient  ID: Jeanette [name], female    DOB: Jul 22, 1950, 69 y.o.   MRN: [id number]  Chief Complaint  Patient presents with   Follow-up    osa - cpap pt has irritation in nose    Referring provider: Bartholome Bill, MD  HPI:  69 year old female former smoker followed in our office for obstructive sleep apnea  PMH: Anemia, type 2 diabetes, thyroid nodule, hyperlipidemia, chronic kidney disease, CHF Smoker/ Smoking History: Former smoker Maintenance:   Pt of: Dr. Elsworth Soho  12/13/2019  - Visit   69 year old female former smoker followed in our office for obstructive sleep apnea.  She is maintained on CPAP therapy.  She is presenting today as a 8-month follow-up.  Her CPAP compliance report shows excellent compliance.  See compliance report listed below:  11/12/2019-12/11/2019-30 had a last 30 days used, 23 those days greater than 4 hours, average usage 6 hours and 1 minutes, APAP setting 5-20, 95th percentile 11.7, AHI 1.3  Patient reports that she is doing well since last being seen.  Leaks have improved on compliance report as well.  Unfortunately patient is still having same skin irritation around the mass site.  She does prefer the nasal pillows that she is currently using.  She admits that there are times where she forgets that it is even on her face.  She is starting develop skin irritation around her nares.  Questionaires / Pulmonary Flowsheets:   ACT:  No flowsheet data found.  MMRC: mMRC Dyspnea Scale mMRC Score  09/13/2019 0    Epworth:  Results of the Epworth flowsheet 04/02/2019  Sitting and reading 2  Watching TV 3  Sitting, inactive in a public place (e.g. a theatre or a meeting) 0  As a passenger in a car for an hour without a break 0  Lying down to rest in the afternoon when circumstances permit 3  Sitting and talking to someone 0  Sitting quietly after a lunch without alcohol 3  In a car, while stopped for a few minutes in traffic 0  Total score 11     Tests:   CT chest 03/2014 left upper lobe 13 x 12 mm calcified nodule, left hilar calcified lymph nodes  05/16/2019-Home sleep study-AHI 12.2, SaO2 low 79%  FENO:  No results found for: NITRICOXIDE  PFT: No flowsheet data found.  WALK:  No flowsheet data found.  Imaging: XR Cervical Spine 2 or 3 views  Result Date: 11/22/2019 Multilevel spondylosis with anterior and posterior spurring was noted.  Significant narrowing was noted between C5-C6.  Facet joint arthropathy was noted. Impression: These findings are consistent with degenerative disease of cervical spine with facet joint arthropathy.  XR KNEE 3 VIEW RIGHT  Result Date: 11/22/2019 Moderate medial compartment narrowing with no chondrocalcinosis was noted.  Moderate patellofemoral narrowing was noted. Impression: These findings are consistent with moderate osteoarthritis and moderate chondromalacia patella.  XR Lumbar Spine 2-3 Views  Result Date: 11/22/2019 Multilevel spondylosis and facet joint arthropathy with foraminal narrowing was noted. Impression: These findings are consistent with multilevel spondylosis and facet joint arthropathy.  XR Shoulder Left  Result Date: 11/22/2019 No glenohumeral joint space narrowing was noted.  Acromioclavicular narrowing and arthritis was noted.  No chondrocalcinosis was noted. Impression: These findings are consistent with acromioclavicular arthritis.   Lab Results:  CBC    Component Value Date/Time   WBC 6.8 07/15/2019 0019   RBC 4.67 07/15/2019 0019   HGB 12.6 07/15/2019 0019   HGB 12.0 03/22/2014 1139  HCT 40.8 07/15/2019 0019   HCT 36.9 03/22/2014 1139   PLT 250 07/15/2019 0019   PLT 186 03/22/2014 1139   MCV 87.4 07/15/2019 0019   MCV 81.5 03/22/2014 1139   MCH 27.0 07/15/2019 0019   MCHC 30.9 07/15/2019 0019   RDW 14.9 07/15/2019 0019   RDW 16.1 (H) 03/22/2014 1139   LYMPHSABS 3.2 07/15/2019 0019   LYMPHSABS 1.9 03/22/2014 1139   MONOABS 0.6 07/15/2019 0019    MONOABS 0.3 03/22/2014 1139   EOSABS 0.1 07/15/2019 0019   EOSABS 0.1 03/22/2014 1139   BASOSABS 0.0 07/15/2019 0019   BASOSABS 0.0 03/22/2014 1139    BMET    Component Value Date/Time   NA 141 07/15/2019 0019   NA 143 03/22/2014 1140   K 4.5 07/15/2019 0019   K 4.0 03/22/2014 1140   CL 107 07/15/2019 0019   CO2 27 07/15/2019 0019   CO2 29 03/22/2014 1140   GLUCOSE 73 07/15/2019 0019   GLUCOSE 89 03/22/2014 1140   BUN 24 (H) 07/15/2019 0019   BUN 19.6 03/22/2014 1140   CREATININE 1.63 (H) 07/15/2019 0019   CREATININE 1.1 03/22/2014 1140   CALCIUM 8.8 (L) 07/15/2019 0019   CALCIUM 10.3 03/22/2014 1140   GFRNONAA 32 (L) 07/15/2019 0019   GFRAA 37 (L) 07/15/2019 0019    BNP No results found for: BNP  ProBNP No results found for: PROBNP  Specialty Problems      Pulmonary Problems   OSA (obstructive sleep apnea)    05/16/2019-Home sleep study-AHI 12.2, SaO2 low 79%       Wheezing      Allergies  Allergen Reactions   Topamax [Topiramate] Other (See Comments)    CAUSED HAIR LOSS    Immunization History  Administered Date(s) Administered   Influenza, High Dose Seasonal PF 01/12/2019   Influenza,inj,Quad PF,6+ Mos 02/07/2015, 01/15/2016, 02/02/2017   Influenza,inj,quad, With Preservative 03/12/2014   Influenza-Unspecified 01/22/2014, 02/07/2015, 01/15/2016, 02/02/2017, 02/21/2018   PFIZER SARS-COV-2 Vaccination 07/26/2019, 08/21/2019   PPD Test 08/13/2014   Pneumococcal Conjugate-13 08/07/2014, 02/23/2018   Pneumococcal Polysaccharide-23 05/25/2011, 09/15/2017   Tdap 02/05/2014   Zoster 08/07/2014, 11/16/2017   Zoster Recombinat (Shingrix) 08/07/2014, 11/16/2017, 02/23/2018    Past Medical History:  Diagnosis Date   Anemia    Arthritis    "back" (07/18/2014)   Cancer of left breast (HCC)    Chronic back pain    Headache    Heart murmur    many years ago   Hyperlipidemia    Hypertension    Hypothyroidism    thyroidectomy  07/18/2014   Neuromuscular disorder ([place])    neuropathy hands and feet   Personal history of chemotherapy    Personal history of radiation therapy    Stroke ([place])    Type II diabetes mellitus ([place])     Tobacco History: Social History   Tobacco Use  Smoking Status Former Smoker   Packs/day: 0.25   Years: 5.00   Pack years: 1.25   Types: Cigarettes   Quit date: 05/24/1970   Years since quitting: 49.5  Smokeless Tobacco Never Used   Counseling given: Yes   Continue to not smoke  Outpatient Encounter Medications as of 12/13/2019  Medication Sig   ACCU-CHEK AVIVA PLUS test strip    albuterol (VENTOLIN HFA) 108 (90 Base) MCG/ACT inhaler Inhale 2 puffs into the lungs every 6 (six) hours as needed for wheezing or shortness of breath.   amitriptyline (ELAVIL) 10 MG tablet Take 1 tablet (10 mg  total) by mouth at bedtime.   amLODipine (NORVASC) 10 MG tablet Take 10 mg by mouth daily.   aspirin EC 81 MG tablet Take 81 mg by mouth daily.   atorvastatin (LIPITOR) 20 MG tablet Take 20 mg by mouth daily.    calcium-vitamin D (OSCAL WITH D) 500-200 MG-UNIT per tablet Take 2 tablets by mouth 2 (two) times daily.   clopidogrel (PLAVIX) 75 MG tablet Take 1 tablet (75 mg total) by mouth daily.   furosemide (LASIX) 20 MG tablet Take 20 mg by mouth daily.    gabapentin (NEURONTIN) 300 MG capsule Take 300 mg by mouth 3 (three) times daily.    glimepiride (AMARYL) 2 MG tablet Take 2 mg by mouth daily with breakfast.    levothyroxine (SYNTHROID, LEVOTHROID) 88 MCG tablet Take 88 mcg by mouth daily before breakfast.   Multiple Vitamins-Minerals (MULTIVITAMIN WITH MINERALS) tablet Take 1 tablet by mouth daily.    olmesartan (BENICAR) 20 MG tablet Take 20 mg by mouth daily.   No facility-administered encounter medications on file as of 12/13/2019.     Review of Systems  Review of Systems  Constitutional: Negative for activity change, fatigue and fever.  HENT: Negative for  sinus pressure, sinus pain and sore throat.   Respiratory: Negative for cough, shortness of breath and wheezing.   Cardiovascular: Negative for chest pain and palpitations.  Gastrointestinal: Negative for diarrhea, nausea and vomiting.  Musculoskeletal: Negative for arthralgias.  Skin: Positive for rash (outline of face from old full face mask, now also around nares d/t nasal pillows).  Neurological: Negative for dizziness.  Psychiatric/Behavioral: Negative for sleep disturbance. The patient is not nervous/anxious.      Physical Exam  BP 122/86 (BP Location: Left Arm, Cuff Size: Normal)    Pulse 68    Temp 98 F (36.7 C)    Ht 5\' 2"  (1.575 m)    Wt 192 lb (87.1 kg)    SpO2 98%    BMI 35.12 kg/m   Wt Readings from Last 5 Encounters:  12/13/19 192 lb (87.1 kg)  11/22/19 193 lb 6.4 oz (87.7 kg)  10/09/19 189 lb (85.7 kg)  09/13/19 195 lb 3.2 oz (88.5 kg)  07/14/19 196 lb (88.9 kg)    BMI Readings from Last 5 Encounters:  12/13/19 35.12 kg/m  11/22/19 35.37 kg/m  10/09/19 34.57 kg/m  09/13/19 35.70 kg/m  07/14/19 35.85 kg/m     Physical Exam Vitals and nursing note reviewed.  Constitutional:      General: She is not in acute distress.    Appearance: Normal appearance. She is obese.  HENT:     Head: Normocephalic and atraumatic.     Right Ear: Tympanic membrane, ear canal and external ear normal. There is no impacted cerumen.     Left Ear: Tympanic membrane, ear canal and external ear normal. There is no impacted cerumen.     Nose: Rhinorrhea present. No congestion.     Mouth/Throat:     Mouth: Mucous membranes are moist.     Pharynx: Oropharynx is clear.  Eyes:     Pupils: Pupils are equal, round, and reactive to light.  Cardiovascular:     Rate and Rhythm: Normal rate and regular rhythm.     Pulses: Normal pulses.     Heart sounds: Normal heart sounds. No murmur heard.   Pulmonary:     Effort: Pulmonary effort is normal. No respiratory distress.     Breath  sounds: Normal breath sounds. No decreased  air movement. No decreased breath sounds, wheezing or rales.  Musculoskeletal:     Cervical back: Normal range of motion.  Skin:    General: Skin is warm and dry.     Capillary Refill: Capillary refill takes less than 2 seconds.       Neurological:     General: No focal deficit present.     Mental Status: She is alert and oriented to person, place, and time. Mental status is at baseline.     Gait: Gait normal.  Psychiatric:        Mood and Affect: Mood normal.        Behavior: Behavior normal.        Thought Content: Thought content normal.        Judgment: Judgment normal.       Assessment & Plan:   OSA (obstructive sleep apnea) Plan: Continue CPAP therapy We will send message to [facility] to see if they can find an alternative mask due to your skin irritation We will see you back in 6 to 12 months or sooner if you are having any issues with using your CPAP    Return in about 1 year (around 12/12/2020), or if symptoms worsen or fail to improve, for Follow up with Dr. Elsworth Soho.   Lauraine Rinne, NP 12/13/2019   This appointment required 23 minutes of patient care (this includes precharting, chart review, review of results, face-to-face care, etc.).

## 2019-12-14 NOTE — Progress Notes (Signed)
Office Visit Note  Patient: Jeanette [name]             Date of Birth: May 31, 1950           MRN: [id number]             PCP: Bartholome Bill, MD Referring: Bartholome Bill, MD Visit Date: 12/17/2019 Occupation: @GUAROCC @  Subjective: Pain in multiple joints.   History of Present Illness: Meagon Duskin Lauf is a 69 y.o. female with history of osteoarthritis and degenerative disc disease.  She states she continues to have some discomfort in her cervical spine and lumbar spine.  She also has discomfort in her right knee joint.  She has a stiffness in her hands.  She denies any joint swelling.  Activities of Daily Living:  Patient reports morning stiffness for several  hours.   Patient Denies nocturnal pain.  Difficulty dressing/grooming: Denies Difficulty climbing stairs: Reports Difficulty getting out of chair: Reports Difficulty using hands for taps, buttons, cutlery, and/or writing: Denies  Review of Systems  Constitutional: Negative for fatigue, night sweats, weight gain and weight loss.  HENT: Positive for mouth dryness. Negative for mouth sores, trouble swallowing, trouble swallowing and nose dryness.   Eyes: Negative for pain, redness, visual disturbance and dryness.  Respiratory: Negative for cough, shortness of breath and difficulty breathing.   Cardiovascular: Negative for chest pain, palpitations, hypertension, irregular heartbeat and swelling in legs/feet.  Gastrointestinal: Positive for constipation. Negative for blood in stool and diarrhea.  Endocrine: Negative for increased urination.  Genitourinary: Negative for difficulty urinating and vaginal dryness.  Musculoskeletal: Positive for arthralgias, joint pain and morning stiffness. Negative for joint swelling, myalgias, muscle weakness, muscle tenderness and myalgias.  Skin: Negative for color change, rash, hair loss, redness, skin tightness, ulcers and sensitivity to sunlight.  Allergic/Immunologic:  Negative for susceptible to infections.  Neurological: Positive for memory loss. Negative for dizziness, numbness, headaches, night sweats and weakness.  Hematological: Positive for bruising/bleeding tendency. Negative for swollen glands.  Psychiatric/Behavioral: Negative for depressed mood, confusion and sleep disturbance. The patient is not nervous/anxious.     PMFS History:  Patient Active Problem List   Diagnosis Date Noted  . OSA (obstructive sleep apnea) 04/02/2019  . Wheezing 04/02/2019  . Migraine 03/19/2015  . Abnormal MRI of the head 03/19/2015  . CKD stage 3 due to type 2 diabetes mellitus ([place]) 02/24/2015  . Chronic diastolic CHF (congestive heart failure) ([place]) 02/24/2015  . Acute CVA (cerebrovascular accident) ([place])   . 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 Multiple thyroid nodules 07/18/2014  . HLD (hyperlipidemia) 03/26/2014  . Breast cancer of upper-outer quadrant of left female breast ([place]) 03/22/2014  . 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 Diabetes ([place]) 02/05/2014  . Thyroid nodule 02/05/2014  . H/O malignant neoplasm of breast 02/05/2014  . Essential (primary) hypertension 02/05/2014    Past Medical History:  Diagnosis Date  . Anemia   . Arthritis    "back" (07/18/2014)  . Cancer of left breast ([place])   . Chronic back pain   . Headache   . Heart murmur    many years ago  . Hyperlipidemia   . Hypertension   . Hypothyroidism    thyroidectomy 07/18/2014  . Neuromuscular disorder (HCC)    neuropathy hands and feet  . Personal history of chemotherapy   . Personal history of radiation therapy   . Stroke ([place])   . Type II  diabetes mellitus ([place])     Family History  Problem Relation Age of Onset  . Hypertension Father   . Diabetes Father   . Prostate cancer Father   . Hypertension Sister   . Diabetes Sister   . Breast cancer Maternal Aunt   . Hypertension Son    Past Surgical  History:  Procedure Laterality Date  . ABDOMINAL HYSTERECTOMY  1970's  . APPENDECTOMY    . BILATERAL OOPHORECTOMY  1980j's  . BREAST BIOPSY Left 2008  . BUNIONECTOMY Right   . CARPAL TUNNEL RELEASE Bilateral   . MASTECTOMY Left 2008  . REDUCTION MAMMAPLASTY Right   . TEE WITHOUT CARDIOVERSION N/A 02/25/2015   Procedure: TRANSESOPHAGEAL ECHOCARDIOGRAM (TEE);  Surgeon: Sueanne Margarita, MD;  Location[facility];  Service: Cardiovascular;  Laterality: N/A;  . THYROIDECTOMY N/A 07/18/2014   Procedure: TOTAL THYROIDECTOMY;  Surgeon: Armandina Gemma, MD;  Location: [facility];  Service: General;  Laterality: N/A;  . TONSILLECTOMY    . TOTAL THYROIDECTOMY  07/18/2014   Social History   Social History Narrative   Lives at home alone   Right handed   Caffeine: 1 cup coffee daily   Immunization History  Administered Date(s) Administered  . Influenza, High Dose Seasonal PF 01/12/2019  . Influenza,inj,Quad PF,6+ Mos 02/07/2015, 01/15/2016, 02/02/2017  . Influenza,inj,quad, With Preservative 03/12/2014  . Influenza-Unspecified 01/22/2014, 02/07/2015, 01/15/2016, 02/02/2017, 02/21/2018  . PFIZER SARS-COV-2 Vaccination 07/26/2019, 08/21/2019  . PPD Test 08/13/2014  . Pneumococcal Conjugate-13 08/07/2014, 02/23/2018  . Pneumococcal Polysaccharide-23 05/25/2011, 09/15/2017  . Tdap 02/05/2014  . Zoster 08/07/2014, 11/16/2017  . Zoster Recombinat (Shingrix) 08/07/2014, 11/16/2017, 02/23/2018     Objective: Vital Signs: BP 115/70 (BP Location: Right Arm, Patient Position: Sitting, Cuff Size: Normal)   Pulse 69   Resp 14   Ht 5\' 2"  (1.575 m)   Wt 197 lb 6.4 oz (89.5 kg)   BMI 36.10 kg/m    Physical Exam Vitals and nursing note reviewed.  Constitutional:      Appearance: She is well-developed.  HENT:     Head: Normocephalic and atraumatic.  Eyes:     Conjunctiva/sclera: Conjunctivae normal.  Cardiovascular:     Rate and Rhythm: Normal rate and regular rhythm.     Heart sounds: Normal heart  sounds.  Pulmonary:     Effort: Pulmonary effort is normal.     Breath sounds: Normal breath sounds.  Abdominal:     General: Bowel sounds are normal.     Palpations: Abdomen is soft.  Musculoskeletal:     Cervical back: Normal range of motion.  Lymphadenopathy:     Cervical: No cervical adenopathy.  Skin:    General: Skin is warm and dry.     Capillary Refill: Capillary refill takes less than 2 seconds.  Neurological:     Mental Status: She is alert and oriented to person, place, and time.  Psychiatric:        Behavior: Behavior normal.      Musculoskeletal Exam: C-spine thoracic and lumbar spine with good range of motion.  She has some discomfort range of motion of her lumbar spine.  Shoulder joints, elbow joints, wrist joints with good range of motion.  She has some DIP and PIP thickening.  Hip joints with good range of motion.  She has some warmth on palpation of her right knee joint without any discomfort.  CDAI Exam: CDAI Score: -- Patient Global: --; Provider Global: -- Swollen: --; Tender: -- Joint Exam 12/17/2019   No  joint exam has been documented for this visit   There is currently no information documented on the homunculus. Go to the Rheumatology activity and complete the homunculus joint exam.  Investigation: No additional findings.  Imaging: XR Cervical Spine 2 or 3 views  Result Date: 11/22/2019 Multilevel spondylosis with anterior and posterior spurring was noted.  Significant narrowing was noted between C5-C6.  Facet joint arthropathy was noted. Impression: These findings are consistent with degenerative disease of cervical spine with facet joint arthropathy.  XR KNEE 3 VIEW RIGHT  Result Date: 11/22/2019 Moderate medial compartment narrowing with no chondrocalcinosis was noted.  Moderate patellofemoral narrowing was noted. Impression: These findings are consistent with moderate osteoarthritis and moderate chondromalacia patella.  XR Lumbar Spine 2-3  Views  Result Date: 11/22/2019 Multilevel spondylosis and facet joint arthropathy with foraminal narrowing was noted. Impression: These findings are consistent with multilevel spondylosis and facet joint arthropathy.  XR Shoulder Left  Result Date: 11/22/2019 No glenohumeral joint space narrowing was noted.  Acromioclavicular narrowing and arthritis was noted.  No chondrocalcinosis was noted. Impression: These findings are consistent with acromioclavicular arthritis.   Recent Labs: Lab Results  Component Value Date   WBC 6.8 07/15/2019   HGB 12.6 07/15/2019   PLT 250 07/15/2019   NA 141 07/15/2019   K 4.5 07/15/2019   CL 107 07/15/2019   CO2 27 07/15/2019   GLUCOSE 73 07/15/2019   BUN 24 (H) 07/15/2019   CREATININE 1.63 (H) 07/15/2019   BILITOT 1.10 03/22/2014   ALKPHOS 58 03/22/2014   AST 18 03/22/2014   ALT 20 03/22/2014   PROT 7.0 03/22/2014   ALBUMIN 4.1 03/22/2014   CALCIUM 8.8 (L) 07/15/2019   GFRAA 37 (L) 07/15/2019   November 22, 2019 ANA negative, ENA negative, C3-C4 normal, RF negative, anti-CCP negative, 14 3 3  eta negative, ACE 19, uric acid 5.2  Speciality Comments: No specialty comments available.  Procedures:  No procedures performed Allergies: Topamax [topiramate]   Assessment / Plan:     Visit Diagnoses: DDD (degenerative disc disease), cervical - And facet joint arthropathy.  I offered physical therapy or exercises.  Patient states she goes to aquatic center and is exercises there.  DDD (degenerative disc disease), lumbar - And facet joint arthropathy.  She continues to have some lower back discomfort.  She has been exercising at the aquatic center and declined physical therapy.  Primary osteoarthritis of right knee - Moderate osteoarthritis and moderate chondromalacia patella.  She had some warmth on palpation of her right knee joint.  She has been doing fairly well.  Some of the exercises were demonstrated in the office.  I discussed use of diclofenac gel and  natural anti-inflammatories.  Arthritis of left acromioclavicular joint-she is doing better.  Range of motion exercises were discussed.  Positive ANA (antinuclear antibody) - Repeat ANA negative, ENA negative, C3-C4 normal.  She has no clinical features of autoimmune disease.  Other medical problems are listed as follows:  Essential (primary) hypertension  Acute CVA (cerebrovascular accident) ([place])  Chronic diastolic CHF (congestive heart failure) ([place])  History of hyperlipidemia  Diabetes mellitus due to underlying condition with hyperosmolarity without coma, without long-term current use of insulin ([place])  CKD stage 3 due to type 2 diabetes mellitus (HCC)  OSA (obstructive sleep apnea)  Fasciculations of muscle  Hypothyroidism, postsurgical  Malignant neoplasm of upper-outer quadrant of left breast in female, estrogen receptor positive ([place])  COVID-19 vaccine-patient states she has had COVID-19 vaccine.  Orders: No orders of  the defined types were placed in this encounter.  No orders of the defined types were placed in this encounter.    Follow-Up Instructions: Return if symptoms worsen or fail to improve, for Osteoarthritis.   Bo Merino, MD  Note - This record has been created using Editor, commissioning.  Chart creation errors have been sought, but may not always  have been located. Such creation errors do not reflect on  the standard of medical care.

## 2019-12-17 ENCOUNTER — Encounter: Payer: Self-pay | Admitting: Rheumatology

## 2019-12-17 ENCOUNTER — Other Ambulatory Visit: Payer: Self-pay

## 2019-12-17 ENCOUNTER — Ambulatory Visit (INDEPENDENT_AMBULATORY_CARE_PROVIDER_SITE_OTHER): Payer: Medicare HMO | Admitting: Rheumatology

## 2019-12-17 VITALS — BP 115/70 | HR 69 | Resp 14 | Ht 62.0 in | Wt 197.4 lb

## 2019-12-17 DIAGNOSIS — C50412 Malignant neoplasm of upper-outer quadrant of left female breast: Secondary | ICD-10-CM

## 2019-12-17 DIAGNOSIS — M5136 Other intervertebral disc degeneration, lumbar region: Secondary | ICD-10-CM | POA: Diagnosis not present

## 2019-12-17 DIAGNOSIS — I639 Cerebral infarction, unspecified: Secondary | ICD-10-CM

## 2019-12-17 DIAGNOSIS — Z8639 Personal history of other endocrine, nutritional and metabolic disease: Secondary | ICD-10-CM

## 2019-12-17 DIAGNOSIS — R768 Other specified abnormal immunological findings in serum: Secondary | ICD-10-CM

## 2019-12-17 DIAGNOSIS — M5030 Other cervical disc degeneration, unspecified cervical region: Secondary | ICD-10-CM

## 2019-12-17 DIAGNOSIS — G4733 Obstructive sleep apnea (adult) (pediatric): Secondary | ICD-10-CM

## 2019-12-17 DIAGNOSIS — M1711 Unilateral primary osteoarthritis, right knee: Secondary | ICD-10-CM | POA: Diagnosis not present

## 2019-12-17 DIAGNOSIS — E0800 Diabetes mellitus due to underlying condition with hyperosmolarity without nonketotic hyperglycemic-hyperosmolar coma (NKHHC): Secondary | ICD-10-CM

## 2019-12-17 DIAGNOSIS — E1122 Type 2 diabetes mellitus with diabetic chronic kidney disease: Secondary | ICD-10-CM

## 2019-12-17 DIAGNOSIS — Z170 Estrogen receptor positive status [ER+]: Secondary | ICD-10-CM

## 2019-12-17 DIAGNOSIS — N183 Chronic kidney disease, stage 3 unspecified: Secondary | ICD-10-CM

## 2019-12-17 DIAGNOSIS — M51369 Other intervertebral disc degeneration, lumbar region without mention of lumbar back pain or lower extremity pain: Secondary | ICD-10-CM

## 2019-12-17 DIAGNOSIS — R253 Fasciculation: Secondary | ICD-10-CM

## 2019-12-17 DIAGNOSIS — M19012 Primary osteoarthritis, left shoulder: Secondary | ICD-10-CM

## 2019-12-17 DIAGNOSIS — I10 Essential (primary) hypertension: Secondary | ICD-10-CM

## 2019-12-17 DIAGNOSIS — I5032 Chronic diastolic (congestive) heart failure: Secondary | ICD-10-CM

## 2019-12-17 DIAGNOSIS — E890 Postprocedural hypothyroidism: Secondary | ICD-10-CM

## 2019-12-21 ENCOUNTER — Telehealth: Payer: Self-pay | Admitting: Pulmonary Disease

## 2019-12-21 NOTE — Telephone Encounter (Signed)
Order was sent to [facility] on 7/23 for supplies.  I called [facility] & spoke to [place].  She states pt got new machine in February & is due for supplies in August.  She states order was just to make sure pt can continue with supplies.  She states if pt needs anything she can call them.  I called pt & made her aware.  She states she is fine for new & will call [facility] if she needs anything before new supplies are delivered.  Nothing further needed.

## 2020-01-17 ENCOUNTER — Ambulatory Visit: Payer: Medicare HMO | Admitting: Adult Health

## 2020-02-12 ENCOUNTER — Ambulatory Visit: Payer: Medicare HMO | Admitting: Podiatry

## 2020-02-25 ENCOUNTER — Other Ambulatory Visit: Payer: Self-pay | Admitting: Family Medicine

## 2020-02-25 DIAGNOSIS — Z0000 Encounter for general adult medical examination without abnormal findings: Secondary | ICD-10-CM

## 2020-03-20 ENCOUNTER — Ambulatory Visit: Payer: Medicare HMO | Admitting: Adult Health

## 2020-03-20 NOTE — Progress Notes (Deleted)
[facility] [address] [place]. [place] [address] [phone]       OFFICE FOLLOW UP VISIT NOTE  Ms. CALLIOPE DELANGEL Date of Birth:  1950/07/24 Medical Record Number:  [id number]   Referring MD: Precious Haws Reason for Referral: Memory loss and chronic headache   Chief complaint: No chief complaint on file.     HPI:  Today, 03/20/2020, Ms. Pry returns for follow-up with prior complaints of memory concerns and headaches.  Initiated amitriptyline 25 mg at prior visit but unable to tolerate therefore decreased to 10 mg nightly ***.  Reports headaches ***.  Cognition ***.  History of stroke and continues on aspirin 81 mg daily and atorvastatin 20 mg daily without side effects.  Blood pressure today ***.  Reports ongoing use of CPAP for OSA management followed by pulmonology.    History provided for reference purposes only Update 10/09/2019 JM: Ms. Moga returns for follow-up regarding memory concerns and headaches.  Since prior visit, she was evaluated in the ED on 07/14/2019 for intermittent episodes of right upper extremity numbness, tingling, balance issues and headache.  Headache resolved after migraine cocktail.  MRI unremarkable and was advised to follow-up outpatient.  She denies reoccurring transient episodes of numbness, tingling and balance but continues to experience daily headaches.  Longstanding history of headache with use of Depakote 500 mg twice daily and at prior visit recommended increasing dosage to 1000 mg twice daily but apparently was only taking once daily.  She was advised to increase to twice daily after ED admission as well as being started on venlafaxine Dr Krista Blue who was on-call provider at that time.  She reports since that time, headaches have been daily with at time 9/10 pain located "all over my head" associated with photophobia, phonophobia but denies nausea or vomiting.  She reports at times headache can be located occipital area with  a pressure type band sensation.  She states she attempted to take 1500 mg of Depakote spread out throughout the day but did not notice any benefit therefore currently only taking one 500 mg tablet daily in addition to venlafaxine 37.5 mg daily.  Due to continued headaches, she is questioning change in medical management. She has not been on any other prior preventative medications.  She was recently found to have positive ANA and does have rheumatology evaluation on 7/1.  Blood pressure stable at 118/77 and does monitor at home and typically lower.  History of OSA with ongoing compliance of CPAP currently being followed by pulmonology.  Cognition has been stable without worsening.  No further concerns at this time.  Update 04/10/2019 Dr Leonie Man:  She returns for follow-up after last visit 4 months ago.  She states her subjective memory difficulties appear more or less unchanged.  She denies significant worsening of her symptoms or ability to do activities of daily living for herself.  She mostly struggles with recent information and short-term memory but long-term memory appears to be preserved.  She did undergo lab work for reversible causes of memory loss and 12/07/2018 and vitamin B12, TSH, RPR and homocystine were all normal.  EEG done on 01/15/2019 was normal.  MRI scan of the brain on 01/27/2019 was also normal.  She has not been participating regularly in cognitively challenging activities.  She has not been quite physically active either.  She has heard about provision over-the-counter and would like to try it.  She continues to have headaches but they are not as severe and occur about  3 or 4 times a week.  She is currently on Depakote ER 500 twice daily and it seems to help her and she is tolerating it well without any side effects.  She has no new complaints today.  Initial visit 12/07/2018 Dr. Leonie Man : Ms. Feinberg is a 69 year old pleasant African-American lady who is seen today for evaluation for memory  difficulties.  History is obtained from the patient, review of electronic medical records and I personally reviewed imaging films in PACS.  She has a past medical history of breast cancer 2018 status post surgery, chemotherapy and radiation now in remission, chronic headaches, hypertension, hyperlipidemia, hypothyroidism and chronic back pain.  She states for the last 3 months or so she is noticed some memory and cognitive difficulties.  She describes that mostly her short-term memory is poor but occasionally she has even some long-term memory difficulties.  She has trouble remembering names and recent events.  She has learned to write things down.  She has not noticed any trouble in driving and has never gotten lost.  She has been able to manage all her activities of daily living and continues to live alone.  She does admit to her mood being slightly under at times but denies depression.  She is never been on treatment for depression.  She has chronic headaches and has seen me several years ago and has been taking Depakote ER 500 twice daily and feels her headaches are under control she can only occasional headaches.  She denies any stroke, seizure, significant head injury with loss of consciousness.  There is no family history of dementia or cognitive problems.  She has not had any recent brain scan or lab work for memory loss done.  She has a prior history of abnormal MRI showing transient left medial temporal diffusion hyperintensity on 02/22/2015 of unclear significance which was found to have resolved on follow-up MRI in 03/30/2015.  She underwent extensive work-up for stroke and seizures which was all negative.     ROS:   14 system review of systems is positive for headache and all other systems negative PMH:  Past Medical History:  Diagnosis Date  . Anemia   . Arthritis    "back" (07/18/2014)  . Cancer of left breast ([facility])   . Chronic back pain   . Headache   . Heart murmur    many years ago    . Hyperlipidemia   . Hypertension   . Hypothyroidism    thyroidectomy 07/18/2014  . Neuromuscular disorder ([facility])    neuropathy hands and feet  . Personal history of chemotherapy   . Personal history of radiation therapy   . Stroke ([facility])   . Type II diabetes mellitus ([place])     Social History:  Social History   Socioeconomic History  . Marital status: Divorced    Spouse name: Not on file  . Number of children: 1  . Years of education: Not on file  . Highest education level: Some college, no degree  Occupational History  . Not on file  Tobacco Use  . Smoking status: Former Smoker    Packs/day: 0.25    Years: 5.00    Pack years: 1.25    Types: Cigarettes    Quit date: 05/24/1970    Years since quitting: 49.8  . Smokeless tobacco: Never Used  Vaping Use  . Vaping Use: Never used  Substance and Sexual Activity  . Alcohol use: No    Comment: 07/18/2014 "quit drinking in  the 1980's"  . Drug use: Not Currently    Types: Marijuana    Comment: occassionally way back when  . Sexual activity: Not on file  Other Topics Concern  . Not on file  Social History Narrative   Lives at home alone   Right handed   Caffeine: 1 cup coffee daily   Social Determinants of Health   Financial Resource Strain:   . Difficulty of Paying Living Expenses: Not on file  Food Insecurity:   . Worried About Charity fundraiser in the Last Year: Not on file  . Ran Out of Food in the Last Year: Not on file  Transportation Needs:   . Lack of Transportation (Medical): Not on file  . Lack of Transportation (Non-Medical): Not on file  Physical Activity:   . Days of Exercise per Week: Not on file  . Minutes of Exercise per Session: Not on file  Stress:   . Feeling of Stress : Not on file  Social Connections:   . Frequency of Communication with Friends and Family: Not on file  . Frequency of Social Gatherings with Friends and Family: Not on file  . Attends Religious Services: Not on file  . Active  Member of Clubs or Organizations: Not on file  . Attends Archivist Meetings: Not on file  . Marital Status: Not on file  Intimate Partner Violence:   . Fear of Current or Ex-Partner: Not on file  . Emotionally Abused: Not on file  . Physically Abused: Not on file  . Sexually Abused: Not on file    Medications:   Current Outpatient Medications on File Prior to Visit  Medication Sig Dispense Refill  . ACCU-CHEK AVIVA PLUS test strip     . albuterol (VENTOLIN HFA) 108 (90 Base) MCG/ACT inhaler Inhale 2 puffs into the lungs every 6 (six) hours as needed for wheezing or shortness of breath. 18 g 11  . amitriptyline (ELAVIL) 10 MG tablet Take 1 tablet (10 mg total) by mouth at bedtime. 90 tablet 1  . amLODipine (NORVASC) 10 MG tablet Take 10 mg by mouth daily.    Marland Kitchen aspirin EC 81 MG tablet Take 81 mg by mouth daily.    Marland Kitchen atorvastatin (LIPITOR) 20 MG tablet Take 20 mg by mouth daily.     . calcium-vitamin D (OSCAL WITH D) 500-200 MG-UNIT per tablet Take 2 tablets by mouth 2 (two) times daily. 60 tablet 1  . clopidogrel (PLAVIX) 75 MG tablet Take 1 tablet (75 mg total) by mouth daily. 90 tablet 3  . furosemide (LASIX) 20 MG tablet Take 20 mg by mouth daily.     Marland Kitchen gabapentin (NEURONTIN) 300 MG capsule Take 300 mg by mouth 3 (three) times daily.     Marland Kitchen glimepiride (AMARYL) 2 MG tablet Take 2 mg by mouth daily with breakfast.     . levothyroxine (SYNTHROID, LEVOTHROID) 88 MCG tablet Take 88 mcg by mouth daily before breakfast.    . Multiple Vitamins-Minerals (MULTIVITAMIN WITH MINERALS) tablet Take 1 tablet by mouth daily.     Marland Kitchen olmesartan (BENICAR) 20 MG tablet Take 20 mg by mouth daily.     No current facility-administered medications on file prior to visit.    Allergies:   Allergies  Allergen Reactions  . Topamax [Topiramate] Other (See Comments)    CAUSED HAIR LOSS    Vitals There were no vitals filed for this visit. There is no height or weight on file to calculate BMI.  Physical Exam General: well developed, well nourished pleasant middle-aged African-American lady, seated, in no evident distress Head: head normocephalic and atraumatic.   Neck: supple with no carotid or supraclavicular bruits Cardiovascular: regular rate and rhythm, no murmurs Musculoskeletal: no deformity Skin:  no rash/petichiae Vascular:  Normal pulses all extremities  Neurologic Exam Mental Status: Awake and fully alert.  Fluent speech and language.  Oriented to place and time. Recent and remote memory intact. Attention span, concentration and fund of knowledge appropriate. Mood and affect appropriate.    Cranial Nerves: Fundoscopic exam not done. Pupils equal, briskly reactive to light. Extraocular movements full without nystagmus. Visual fields full to confrontation. Hearing intact. Facial sensation intact. Face, tongue, palate moves normally and symmetrically.  Motor: Normal bulk and tone. Normal strength in all tested extremity muscles. Sensory.: intact to touch , pinprick , position and vibratory sensation.  Coordination: Rapid alternating movements normal in all extremities. Finger-to-nose and heel-to-shin performed accurately bilaterally. Gait and Station: Arises from chair without difficulty. Stance is normal. Gait demonstrates normal stride length and balance . Able to heel, toe and tandem walk without difficulty.  Reflexes: 1+ and symmetric. Toes downgoing.       ASSESSMENT/PLAN: 69 year old African-American lady with PMH of HTN, HLD, DM, OSA on CPAP and prior stroke with subacute memory and mild cognitive difficulties likely due to mild cognitive impairment as well as recent worsening of chronic headaches.  Recent evaluation in ED on 07/14/2019 with transient left arm numbness/tingling, imbalance and headache with improvement of symptoms after migraine cocktail.  MRI unremarkable.  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 -Cognition has been stable without worsening   Follow-up in 3 months or call earlier if needed  I spent 35 minutes of face-to-face and non-face-to-face time with patient.  This included previsit chart review, lab review, study review, order entry, electronic health record documentation, patient education regarding daily headaches, transition of current medical management and answered all questions to patient satisfaction    Frann Rider, [facility]  [facility] [address] [place], [place] [address]  Phone [phone] Fax [phone] Note: This document was prepared with digital dictation and possible smart phrase technology. Any transcriptional errors that result from this process are unintentional.

## 2020-04-01 ENCOUNTER — Ambulatory Visit: Payer: Medicare HMO

## 2020-04-15 ENCOUNTER — Ambulatory Visit: Payer: Medicare HMO | Admitting: Adult Health

## 2020-04-16 ENCOUNTER — Other Ambulatory Visit: Payer: Self-pay

## 2020-04-16 ENCOUNTER — Ambulatory Visit: Payer: Medicare HMO | Admitting: Adult Health

## 2020-04-16 ENCOUNTER — Encounter: Payer: Self-pay | Admitting: Adult Health

## 2020-04-16 VITALS — BP 142/76 | HR 79 | Ht 62.0 in | Wt 190.0 lb

## 2020-04-16 DIAGNOSIS — G3184 Mild cognitive impairment, so stated: Secondary | ICD-10-CM | POA: Diagnosis not present

## 2020-04-16 DIAGNOSIS — G43709 Chronic migraine without aura, not intractable, without status migrainosus: Secondary | ICD-10-CM

## 2020-04-16 MED ORDER — AMITRIPTYLINE HCL 10 MG PO TABS: 10.0000 mg | ORAL_TABLET | Freq: Every day | ORAL | 3 refills | Status: DC

## 2020-04-16 NOTE — Progress Notes (Signed)
[facility] [address] [place]. [place] [address] [phone]       OFFICE FOLLOW UP VISIT NOTE  [name] Date of Birth:  [birth year] Medical Record Number:  [id number]   Referring MD: Precious Haws Reason for Referral: Migraine f/u   Chief complaint: Chief Complaint  Patient presents with  . Follow-up    migraine fu, rm 9, alone, pt states migraines have improved.       HPI:  Today, 04/16/2020, Jeanette [name] returns for follow-up with prior complaints of memory concerns and headaches.  Initiated amitriptyline 25 mg at prior visit but unable to tolerate therefore decreased to 10 mg nightly.  She has been doing very well without any recent migraine or headaches and tolerating low-dose amitriptyline without difficulty.  Reports improvement of cognitive impairment and maintains ADLs and IADLs independently.  History of stroke and continues on aspirin 81 mg daily and atorvastatin 20 mg daily without side effects.  Blood pressure today 142/76.  Reports ongoing use of CPAP for OSA management followed by pulmonology.  Reports b/l cataract surgery in October with improvement of vision and routinely follows with ophthalmology.  No concerns at this time.    History provided for reference purposes only Update 10/09/2019 JM: Jeanette [name] returns for follow-up regarding memory concerns and headaches.  Since prior visit, she was evaluated in the ED on 07/14/2019 for intermittent episodes of right upper extremity numbness, tingling, balance issues and headache.  Headache resolved after migraine cocktail.  MRI unremarkable and was advised to follow-up outpatient.  She denies reoccurring transient episodes of numbness, tingling and balance but continues to experience daily headaches.  Longstanding history of headache with use of Depakote 500 mg twice daily and at prior visit recommended increasing dosage to 1000 mg twice daily but apparently was only taking once daily.  She  was advised to increase to twice daily after ED admission as well as being started on venlafaxine Dr Krista Blue who was on-call provider at that time.  She reports since that time, headaches have been daily with at time 9/10 pain located "all over my head" associated with photophobia, phonophobia but denies nausea or vomiting.  She reports at times headache can be located occipital area with a pressure type band sensation.  She states she attempted to take 1500 mg of Depakote spread out throughout the day but did not notice any benefit therefore currently only taking one 500 mg tablet daily in addition to venlafaxine 37.5 mg daily.  Due to continued headaches, she is questioning change in medical management. She has not been on any other prior preventative medications.  She was recently found to have positive ANA and does have rheumatology evaluation on 7/1.  Blood pressure stable at 118/77 and does monitor at home and typically lower.  History of OSA with ongoing compliance of CPAP currently being followed by pulmonology.  Cognition has been stable without worsening.  No further concerns at this time.  Update 04/10/2019 Dr Leonie Man:  She returns for follow-up after last visit 4 months ago.  She states her subjective memory difficulties appear more or less unchanged.  She denies significant worsening of her symptoms or ability to do activities of daily living for herself.  She mostly struggles with recent information and short-term memory but long-term memory appears to be preserved.  She did undergo lab work for reversible causes of memory loss and 12/07/2018 and vitamin B12, TSH, RPR and homocystine were all normal.  EEG done on 01/15/2019  was normal.  MRI scan of the brain on 01/27/2019 was also normal.  She has not been participating regularly in cognitively challenging activities.  She has not been quite physically active either.  She has heard about provision over-the-counter and would like to try it.  She continues to  have headaches but they are not as severe and occur about 3 or 4 times a week.  She is currently on Depakote ER 500 twice daily and it seems to help her and she is tolerating it well without any side effects.  She has no new complaints today.  Initial visit 12/07/2018 Dr. Leonie Man : Jeanette [name] is a 69 year old pleasant African-American lady who is seen today for evaluation for memory difficulties.  History is obtained from the patient, review of electronic medical records and I personally reviewed imaging films in PACS.  She has a past medical history of breast cancer 2018 status post surgery, chemotherapy and radiation now in remission, chronic headaches, hypertension, hyperlipidemia, hypothyroidism and chronic back pain.  She states for the last 3 months or so she is noticed some memory and cognitive difficulties.  She describes that mostly her short-term memory is poor but occasionally she has even some long-term memory difficulties.  She has trouble remembering names and recent events.  She has learned to write things down.  She has not noticed any trouble in driving and has never gotten lost.  She has been able to manage all her activities of daily living and continues to live alone.  She does admit to her mood being slightly under at times but denies depression.  She is never been on treatment for depression.  She has chronic headaches and has seen me several years ago and has been taking Depakote ER 500 twice daily and feels her headaches are under control she can only occasional headaches.  She denies any stroke, seizure, significant head injury with loss of consciousness.  There is no family history of dementia or cognitive problems.  She has not had any recent brain scan or lab work for memory loss done.  She has a prior history of abnormal MRI showing transient left medial temporal diffusion hyperintensity on 02/22/2015 of unclear significance which was found to have resolved on follow-up MRI in 03/30/2015.   She underwent extensive work-up for stroke and seizures which was all negative.     ROS:   14 system review of systems is positive for no complaints and all other systems negative PMH:  Past Medical History:  Diagnosis Date  . Anemia   . Arthritis    "back" (07/18/2014)  . Cancer of left breast ([place])   . Chronic back pain   . Headache   . Heart murmur    many years ago  . Hyperlipidemia   . Hypertension   . Hypothyroidism    thyroidectomy 07/18/2014  . Neuromuscular disorder (HCC)    neuropathy hands and feet  . Personal history of chemotherapy   . Personal history of radiation therapy   . Stroke ([place])   . Type II diabetes mellitus ([place])     Social History:  Social History   Socioeconomic History  . Marital status: Divorced    Spouse name: Not on file  . Number of children: 1  . Years of education: Not on file  . Highest education level: Some college, no degree  Occupational History  . Not on file  Tobacco Use  . Smoking status: Former Smoker    Packs/day: 0.25  Years: 5.00    Pack years: 1.25    Types: Cigarettes    Quit date: 05/24/1970    Years since quitting: 49.9  . Smokeless tobacco: Never Used  Vaping Use  . Vaping Use: Never used  Substance and Sexual Activity  . Alcohol use: No    Comment: 07/18/2014 "quit drinking in the 1980's"  . Drug use: Not Currently    Types: Marijuana    Comment: occassionally way back when  . Sexual activity: Not on file  Other Topics Concern  . Not on file  Social History Narrative   Lives at home alone   Right handed   Caffeine: 1 cup coffee daily   Social Determinants of Health   Financial Resource Strain:   . Difficulty of Paying Living Expenses: Not on file  Food Insecurity:   . Worried About Charity fundraiser in the Last Year: Not on file  . Ran Out of Food in the Last Year: Not on file  Transportation Needs:   . Lack of Transportation (Medical): Not on file  . Lack of Transportation (Non-Medical): Not  on file  Physical Activity:   . Days of Exercise per Week: Not on file  . Minutes of Exercise per Session: Not on file  Stress:   . Feeling of Stress : Not on file  Social Connections:   . Frequency of Communication with Friends and Family: Not on file  . Frequency of Social Gatherings with Friends and Family: Not on file  . Attends Religious Services: Not on file  . Active Member of Clubs or Organizations: Not on file  . Attends Archivist Meetings: Not on file  . Marital Status: Not on file  Intimate Partner Violence:   . Fear of Current or Ex-Partner: Not on file  . Emotionally Abused: Not on file  . Physically Abused: Not on file  . Sexually Abused: Not on file    Medications:   Current Outpatient Medications on File Prior to Visit  Medication Sig Dispense Refill  . ACCU-CHEK AVIVA PLUS test strip     . albuterol (VENTOLIN HFA) 108 (90 Base) MCG/ACT inhaler Inhale 2 puffs into the lungs every 6 (six) hours as needed for wheezing or shortness of breath. 18 g 11  . amLODipine (NORVASC) 10 MG tablet Take 10 mg by mouth daily.    Marland Kitchen aspirin EC 81 MG tablet Take 81 mg by mouth daily.    Marland Kitchen atorvastatin (LIPITOR) 20 MG tablet Take 20 mg by mouth daily.     . calcium-vitamin D (OSCAL WITH D) 500-200 MG-UNIT per tablet Take 2 tablets by mouth 2 (two) times daily. 60 tablet 1  . clopidogrel (PLAVIX) 75 MG tablet Take 1 tablet (75 mg total) by mouth daily. 90 tablet 3  . furosemide (LASIX) 20 MG tablet Take 20 mg by mouth daily.     Marland Kitchen gabapentin (NEURONTIN) 300 MG capsule Take 300 mg by mouth 3 (three) times daily.     Marland Kitchen levothyroxine (SYNTHROID, LEVOTHROID) 88 MCG tablet Take 88 mcg by mouth daily before breakfast.    . Multiple Vitamins-Minerals (MULTIVITAMIN WITH MINERALS) tablet Take 1 tablet by mouth daily.     Marland Kitchen olmesartan (BENICAR) 20 MG tablet Take 20 mg by mouth daily.    . Semaglutide,0.25 or 0.5MG /DOS, 2 MG/1.5ML SOPN Inject into the skin.     No current  facility-administered medications on file prior to visit.    Allergies:   Allergies  Allergen Reactions  .  Topamax [Topiramate] Other (See Comments)    CAUSED HAIR LOSS    Vitals Today's Vitals   04/16/20 0721  BP: (!) 142/76  Pulse: 79  Weight: 190 lb (86.2 kg)  Height: 5\' 2"  (1.575 m)   Body mass index is 34.75 kg/m.    Physical Exam General: well developed, well nourished pleasant middle-aged African-American lady, seated, in no evident distress Head: head normocephalic and atraumatic.   Neck: supple with no carotid or supraclavicular bruits Cardiovascular: regular rate and rhythm, no murmurs Musculoskeletal: no deformity Skin:  no rash/petichiae Vascular:  Normal pulses all extremities  Neurologic Exam Mental Status: Awake and fully alert.  Fluent speech and language.  Oriented to place and time. Recent and remote memory intact. Attention span, concentration and fund of knowledge appropriate. Mood and affect appropriate.    Cranial Nerves: Pupils equal, briskly reactive to light. Extraocular movements full without nystagmus. Visual fields full to confrontation. Hearing intact. Facial sensation intact. Face, tongue, palate moves normally and symmetrically.  Motor: Normal bulk and tone. Normal strength in all tested extremity muscles. Sensory.: intact to touch , pinprick , position and vibratory sensation.  Coordination: Rapid alternating movements normal in all extremities. Finger-to-nose and heel-to-shin performed accurately bilaterally. Gait and Station: Arises from chair without difficulty. Stance is normal. Gait demonstrates normal stride length and balance . Able to heel, toe and tandem walk without difficulty.  Reflexes: 1+ and symmetric. Toes downgoing.       ASSESSMENT/PLAN: 69 year old African-American lady with PMH of HTN, HLD, DM, OSA on CPAP and prior stroke with subacute memory and mild cognitive difficulties likely due to mild cognitive impairment as well  as recent worsening of chronic headaches.  Recent evaluation in ED on 07/14/2019 with transient left arm numbness/tingling, imbalance and headache with improvement of symptoms after migraine cocktail.  MRI unremarkable.  Reported daily headaches likely mix of migraines without aura with some underlying tension related features which have since resolved after initiating amitriptyline   -Continue amitriptyline 10 mg nightly for migraine prophylaxis (unable to tolerate higher dose) -No benefit with use of Depakote or venlafaxine -Discussed avoidance of migraine triggers and managing stressors -Advised to call with any worsening migraine headaches -Improvement of cognition - will continue to monitor -Continue to follow with PCP for secondary stroke prevention measures   Follow-up in 6 months or call earlier if needed  CC:  [facility] provider: Dr. Barron Alvine, Dola Factor, MD    I spent 30 minutes of face-to-face and non-face-to-face time with patient.  This included previsit chart review, lab review, study review, order entry, electronic health record documentation, patient education regarding migraine headaches, ongoing use of amitriptyline, cognitive impairment and answered all questions to patient satisfaction    Frann Rider, AGNP-BC  [facility] [address] [place], [place] [address]  Phone [phone] Fax [phone] Note: This document was prepared with digital dictation and possible smart phrase technology. Any transcriptional errors that result from this process are unintentional.

## 2020-04-16 NOTE — Progress Notes (Signed)
I agree with the above plan 

## 2020-04-16 NOTE — Patient Instructions (Signed)
Your Plan:  Continue amitriptyline 10 mg nightly for migraine prevention  Routine follow-up with PCP for stroke risk factor management and monitoring    Follow-up in 6 months or call earlier if needed     Thank you for coming to see [place] at [facility]. I hope we have been able to provide you high quality care today.  You may receive a patient satisfaction survey over the next few weeks. We would appreciate your feedback and comments so that we may continue to improve ourselves and the health of our patients.

## 2020-04-24 ENCOUNTER — Other Ambulatory Visit: Payer: Self-pay

## 2020-04-24 ENCOUNTER — Ambulatory Visit: Payer: Medicare HMO | Admitting: Podiatry

## 2020-04-24 ENCOUNTER — Ambulatory Visit (INDEPENDENT_AMBULATORY_CARE_PROVIDER_SITE_OTHER): Payer: Medicare HMO

## 2020-04-24 ENCOUNTER — Encounter: Payer: Self-pay | Admitting: Podiatry

## 2020-04-24 ENCOUNTER — Other Ambulatory Visit: Payer: Self-pay | Admitting: Podiatry

## 2020-04-24 DIAGNOSIS — M7662 Achilles tendinitis, left leg: Secondary | ICD-10-CM

## 2020-04-24 DIAGNOSIS — M778 Other enthesopathies, not elsewhere classified: Secondary | ICD-10-CM

## 2020-04-24 MED ORDER — DEXAMETHASONE SODIUM PHOSPHATE 120 MG/30ML IJ SOLN: 2.0000 mg | Freq: Once | INTRAMUSCULAR | Status: AC

## 2020-04-24 MED ORDER — METHYLPREDNISOLONE 4 MG PO TBPK: ORAL_TABLET | ORAL | 0 refills | Status: DC

## 2020-04-26 NOTE — Progress Notes (Signed)
Subjective:  Patient ID: Jeanette [name], female    DOB: 12/28/1950,  MRN: [id number] HPI Chief Complaint  Patient presents with  . Foot Pain    Posterior heel left - aching x 3 months, no injury, AM pain and after sitting, no treatment  . New Patient (Initial Visit)    69 y.o. female presents with the above complaint.   ROS: Denies fever chills nausea vomiting muscle aches pains calf pain back pain chest pain shortness of breath.  Past Medical History:  Diagnosis Date  . Anemia   . Arthritis    "back" (07/18/2014)  . Cancer of left breast ([place])   . Chronic back pain   . Headache   . Heart murmur    many years ago  . Hyperlipidemia   . Hypertension   . Hypothyroidism    thyroidectomy 07/18/2014  . Neuromuscular disorder (HCC)    neuropathy hands and feet  . Personal history of chemotherapy   . Personal history of radiation therapy   . Stroke ([place])   . Type II diabetes mellitus ([place])    Past Surgical History:  Procedure Laterality Date  . ABDOMINAL HYSTERECTOMY  1970's  . APPENDECTOMY    . BILATERAL OOPHORECTOMY  1980j's  . BREAST BIOPSY Left 2008  . BUNIONECTOMY Right   . CARPAL TUNNEL RELEASE Bilateral   . MASTECTOMY Left 2008  . REDUCTION MAMMAPLASTY Right   . TEE WITHOUT CARDIOVERSION N/A 02/25/2015   Procedure: TRANSESOPHAGEAL ECHOCARDIOGRAM (TEE);  Surgeon: Sueanne Margarita, MD;  Location: [facility] ENDOSCOPY;  Service: Cardiovascular;  Laterality: N/A;  . THYROIDECTOMY N/A 07/18/2014   Procedure: TOTAL THYROIDECTOMY;  Surgeon: Armandina Gemma, MD;  Location: [facility];  Service: General;  Laterality: N/A;  . TONSILLECTOMY    . TOTAL THYROIDECTOMY  07/18/2014    Current Outpatient Medications:  .  ACCU-CHEK AVIVA PLUS test strip, , Disp: , Rfl:  .  albuterol (VENTOLIN HFA) 108 (90 Base) MCG/ACT inhaler, Inhale 2 puffs into the lungs every 6 (six) hours as needed for wheezing or shortness of breath., Disp: 18 g, Rfl: 11 .  amitriptyline (ELAVIL) 10 MG tablet, Take 1 tablet  (10 mg total) by mouth at bedtime., Disp: 90 tablet, Rfl: 3 .  amLODipine (NORVASC) 10 MG tablet, Take 10 mg by mouth daily., Disp: , Rfl:  .  aspirin EC 81 MG tablet, Take 81 mg by mouth daily., Disp: , Rfl:  .  atorvastatin (LIPITOR) 20 MG tablet, Take 20 mg by mouth daily. , Disp: , Rfl:  .  calcium-vitamin D (OSCAL WITH D) 500-200 MG-UNIT per tablet, Take 2 tablets by mouth 2 (two) times daily., Disp: 60 tablet, Rfl: 1 .  clopidogrel (PLAVIX) 75 MG tablet, Take 1 tablet (75 mg total) by mouth daily., Disp: 90 tablet, Rfl: 3 .  furosemide (LASIX) 20 MG tablet, Take 20 mg by mouth daily. , Disp: , Rfl:  .  gabapentin (NEURONTIN) 300 MG capsule, Take 300 mg by mouth 3 (three) times daily. , Disp: , Rfl:  .  levothyroxine (SYNTHROID, LEVOTHROID) 88 MCG tablet, Take 88 mcg by mouth daily before breakfast., Disp: , Rfl:  .  methylPREDNISolone (MEDROL DOSEPAK) 4 MG TBPK tablet, 6 day dose pack - take as directed, Disp: 21 tablet, Rfl: 0 .  Multiple Vitamins-Minerals (MULTIVITAMIN WITH MINERALS) tablet, Take 1 tablet by mouth daily. , Disp: , Rfl:  .  olmesartan (BENICAR) 20 MG tablet, Take 20 mg by mouth daily., Disp: , Rfl:  .  Semaglutide,0.25  or 0.5MG /DOS, 2 MG/1.5ML SOPN, Inject into the skin., Disp: , Rfl:   Current Facility-Administered Medications:  .  dexamethasone (DECADRON) injection 2 mg, 2 mg, Intra-articular, Once, Eithel Ryall T, DPM  Allergies  Allergen Reactions  . Topamax [Topiramate] Other (See Comments)    CAUSED HAIR LOSS   Review of Systems Objective:  There were no vitals filed for this visit.  General: Well developed, nourished, in no acute distress, alert and oriented x3   Dermatological: Skin is warm, dry and supple bilateral. Nails x 10 are well maintained; remaining integument appears unremarkable at this time. There are no open sores, no preulcerative lesions, no rash or signs of infection present.  Vascular: Dorsalis Pedis artery and Posterior Tibial artery pedal  pulses are 2/4 bilateral with immedate capillary fill time. Pedal hair growth present. No varicosities and no lower extremity edema present bilateral.   Neruologic: Grossly intact via light touch bilateral. Vibratory intact via tuning fork bilateral. Protective threshold with Semmes Wienstein monofilament intact to all pedal sites bilateral. Patellar and Achilles deep tendon reflexes 2+ bilateral. No Babinski or clonus noted bilateral.   Musculoskeletal: No gross boney pedal deformities bilateral. No pain, crepitus, or limitation noted with foot and ankle range of motion bilateral. Muscular strength 5/5 in all groups tested bilateral.  She has pain on palpation of the Achilles tendon distally.  Margins appear to be normal there is no fluctuance proximally.  There there is some fluctuance just posterior to the spur.  Gait: Unassisted, Nonantalgic.    Radiographs:  Radiographs taken today demonstrate an osseously mature individual left foot.  Plantar distally oriented calcaneal heel spur with small areas of multiple spurring around the primary spur.  Retrocalcaneal does demonstrate a very small distal spur with some thickening of the Achilles tendon and some swelling in Cager's triangle.  No significant osteoarthritic changes hammertoes are noted  Assessment & Plan:   Assessment: Retrocalcaneal bursitis Achilles tendinitis  Plan: Discussed etiology pathology conservative versus surgical therapies.  Started her on methylprednisolone injected the bursa today with 2 mg of dexamethasone placed her in a night splint since the majority of her pain is in the morning.  I will follow-up with her in 1 month we discussed appropriate shoe gear stretching exercises and ice therapy.     Kamarri Fischetti T. [place], [place]

## 2020-05-09 ENCOUNTER — Ambulatory Visit: Payer: Medicare HMO

## 2020-05-13 ENCOUNTER — Other Ambulatory Visit: Payer: Self-pay | Admitting: Family Medicine

## 2020-05-13 ENCOUNTER — Other Ambulatory Visit: Payer: Self-pay

## 2020-05-13 ENCOUNTER — Ambulatory Visit
Admission: RE | Admit: 2020-05-13 | Discharge: 2020-05-13 | Disposition: A | Discharge status: Home / Self Care | Payer: Medicare HMO | Source: Ambulatory Visit | Attending: Family Medicine | Admitting: Family Medicine

## 2020-05-13 DIAGNOSIS — Z0000 Encounter for general adult medical examination without abnormal findings: Secondary | ICD-10-CM

## 2020-06-10 ENCOUNTER — Telehealth: Payer: Self-pay | Admitting: *Deleted

## 2020-06-10 ENCOUNTER — Ambulatory Visit: Payer: Medicare HMO | Admitting: Podiatry

## 2020-06-10 NOTE — Telephone Encounter (Signed)
Patient is wanting to reschedule due to weather.Please contact@ [phone].

## 2020-06-12 ENCOUNTER — Ambulatory Visit: Payer: Medicare HMO | Admitting: Podiatry

## 2020-06-12 ENCOUNTER — Encounter: Payer: Self-pay | Admitting: Podiatry

## 2020-06-12 ENCOUNTER — Other Ambulatory Visit: Payer: Self-pay

## 2020-06-12 DIAGNOSIS — M7662 Achilles tendinitis, left leg: Secondary | ICD-10-CM

## 2020-06-12 MED ORDER — DEXAMETHASONE SODIUM PHOSPHATE 120 MG/30ML IJ SOLN
2.0000 mg | Freq: Once | INTRAMUSCULAR | Status: AC
  Administered 2020-06-12: 2 mg via INTRA_ARTICULAR

## 2020-06-12 NOTE — Progress Notes (Signed)
She presents today for follow-up of her Achilles tendinitis and plantar fasciitis of her left foot.  States that she is having no pain whatsoever in the Achilles any longer and  Is about 90% better.  Objective: Vital signs are stable she is alert and oriented x3 there is no erythema edema cellulitis drainage or odor no pain on palpation of the Achilles tendon and no palpable bursa posteriorly.  She still has some residual tenderness on palpation of the medial band of the plantar fascia at its insertion site of the calcaneus.  Is mildly warm to the touch and it is firm.  Assessment: Resolving Achilles tendinitis and plantar fasciitis.  Plan: I reinjected the left heel today 20 mg Kenalog 5 mg Marcaine point maximal tenderness she tolerated procedure well without complications she will continue all other conservative therapies until she is 100% improved +1 she will then notify me with questions or concerns.  Follow-up with me as needed

## 2020-10-15 ENCOUNTER — Ambulatory Visit: Payer: Medicare HMO | Admitting: Adult Health

## 2020-10-16 ENCOUNTER — Ambulatory Visit: Payer: Medicare HMO | Admitting: Adult Health

## 2020-11-14 ENCOUNTER — Ambulatory Visit (INDEPENDENT_AMBULATORY_CARE_PROVIDER_SITE_OTHER): Payer: Medicare Other | Admitting: Gastroenterology

## 2020-11-14 ENCOUNTER — Encounter: Payer: Self-pay | Admitting: Gastroenterology

## 2020-11-14 VITALS — BP 120/62 | HR 80 | Ht 62.0 in | Wt 186.2 lb

## 2020-11-14 DIAGNOSIS — K5909 Other constipation: Secondary | ICD-10-CM

## 2020-11-14 DIAGNOSIS — R1030 Lower abdominal pain, unspecified: Secondary | ICD-10-CM

## 2020-11-14 DIAGNOSIS — R159 Full incontinence of feces: Secondary | ICD-10-CM | POA: Diagnosis not present

## 2020-11-14 NOTE — Progress Notes (Signed)
    History of Present Illness: This is a 70 year old female with lower abdominal pain, chronic constipation, fecal incontinence.  She relates problems with constipation for many years.  She generally has a bowel movement every 4 days.  She notes frequent problems with fecal incontinence when she coughs and when she passes flatus.  She had similar problems in the past which resolved when discontinuing metformin.  She notes mild intermittent lower abdominal pain.   Colonoscopy 08/2017 - Two 5 to 6 mm polyps in the sigmoid colon, removed with a cold snare. Resected and retrieved. Path: hyperplastic - Mild melanosis - The examination was otherwise normal on direct and retroflexion views.  Current Medications, Allergies, Past Medical History, Past Surgical History, Family History and Social History were reviewed in [employer] record.   Physical Exam: General: Well developed, well nourished, no acute distress Head: Normocephalic and atraumatic Eyes: Sclerae anicteric, EOMI Ears: Normal auditory acuity Mouth: Not examined, mask on during Covid-19 pandemic Lungs: Clear throughout to auscultation Heart: Regular rate and rhythm; no murmurs, rubs or bruits Abdomen: Soft, minimal lower abdominal tender and non distended. No masses, hepatosplenomegaly or hernias noted. Normal Bowel sounds Rectal: decreased resting tone and decreased squeeze, hard brown heme neg stool in the vault  Musculoskeletal: Symmetrical with no gross deformities  Pulses:  Normal pulses noted Extremities: No clubbing, cyanosis, edema or deformities noted Neurological: Alert oriented x 4, grossly nonfocal Psychological:  Alert and cooperative. Normal mood and affect   Assessment and Recommendations:  Chronic constipation.  Lower abdominal pain. Fecal incontinence with decreased anal sphincter tone and squeeze..  Begin MiraLAX 2-3 times daily as once daily was previously ineffective.  Discontinue stool  softeners.  If she is not having a complete bowel movement at least once daily she is advised to call for further advice.  Begin Kegel exercises 5 times daily.  Consider adding dicyclomine or glycopyrrolate if lower abdominal pain does not improve with treatment of chronic constipation.  Consider anorectal manometry.  REV in 6 weeks.

## 2020-11-14 NOTE — Patient Instructions (Addendum)
Start Miralax over the counter mixing 17 grams in 8 oz of water twice daily and up to 3 x daily.   Also start Kegel exercise 5 x daily.   Kegel Exercises  Kegel exercises can help strengthen your pelvic floor muscles. The pelvic floor is a group of muscles that support your rectum, small intestine, and bladder. In females, pelvic floor muscles also help support the womb (uterus). These muscles help you control the flow of urine and stool. Kegel exercises are painless and simple, and they do not require any equipment. Your provider may suggest Kegel exercises to: Improve bladder and bowel control. Improve sexual response. Improve weak pelvic floor muscles after surgery to remove the uterus (hysterectomy) or pregnancy (females). Improve weak pelvic floor muscles after prostate gland removal or surgery (males). Kegel exercises involve squeezing your pelvic floor muscles, which are the same muscles you squeeze when you try to stop the flow of urine or keep from passing gas. The exercises can be done while sitting, standing, or lying down, but itis best to vary your position. Exercises How to do Kegel exercises: Squeeze your pelvic floor muscles tight. You should feel a tight lift in your rectal area. If you are a female, you should also feel a tightness in your vaginal area. Keep your stomach, buttocks, and legs relaxed. Hold the muscles tight for up to 10 seconds. Breathe normally. Relax your muscles. Repeat as told by your health care provider. Repeat this exercise daily as told by your health care provider. Continue to do this exercise for at least 4-6 weeks, or for as long as told by your healthcare provider. You may be referred to a physical therapist who can help you learn more abouthow to do Kegel exercises. Depending on your condition, your health care provider may recommend: Varying how long you squeeze your muscles. Doing several sets of exercises every day. Doing exercises for several  weeks. Making Kegel exercises a part of your regular exercise routine. This information is not intended to replace advice given to you by your health care provider. Make sure you discuss any questions you have with your healthcare provider. Document Revised: 04/30/2020 Document Reviewed: 12/28/2017 Elsevier Patient Education  2022 [place].  Normal BMI (Body Mass Index- based on height and weight) is between 23 and 30. Your BMI today is Body mass index is 34.06 kg/m. Marland Kitchen Please consider follow up  regarding your BMI with your Primary Care Provider.  Due to recent changes in healthcare laws, you may see the results of your imaging and laboratory studies on MyChart before your provider has had a chance to review them.  We understand that in some cases there may be results that are confusing or concerning to you. Not all laboratory results come back in the same time frame and the provider may be waiting for multiple results in order to interpret others.  Please give [place] 48 hours in order for your provider to thoroughly review all the results before contacting the office for clarification of your results.   Thank you for choosing me and [facility].  Pricilla Riffle. Dagoberto Ligas., MD., Marval Regal

## 2020-11-18 ENCOUNTER — Telehealth: Payer: Self-pay | Admitting: Gastroenterology

## 2020-11-18 NOTE — Telephone Encounter (Signed)
Left message for patient to call back  

## 2020-11-20 NOTE — Telephone Encounter (Signed)
Patient returned call

## 2020-11-20 NOTE — Telephone Encounter (Signed)
Patient reports that she has not seen any improvement in the fecal incontinence.  I reviewed with her that the Kegel exercises will take some time to show improvement in her symtpoms.  I encouraged her to follow Dr. Lynne Leader instructions and if after 3-4 weeks of consistent exercises to please call back if she is not showing improvement.

## 2020-12-17 ENCOUNTER — Ambulatory Visit: Payer: Medicare HMO | Admitting: Adult Health

## 2020-12-19 ENCOUNTER — Telehealth: Payer: Self-pay | Admitting: Hematology and Oncology

## 2020-12-19 NOTE — Telephone Encounter (Signed)
Scheduled appt per 7/29 sch msg. Pt aware.  

## 2020-12-23 ENCOUNTER — Other Ambulatory Visit: Payer: Self-pay

## 2020-12-23 ENCOUNTER — Ambulatory Visit (INDEPENDENT_AMBULATORY_CARE_PROVIDER_SITE_OTHER): Payer: Medicare Other | Admitting: Adult Health

## 2020-12-23 ENCOUNTER — Telehealth: Payer: Self-pay | Admitting: Adult Health

## 2020-12-23 ENCOUNTER — Encounter: Payer: Self-pay | Admitting: Adult Health

## 2020-12-23 VITALS — BP 144/79 | HR 86 | Ht 62.0 in | Wt 185.0 lb

## 2020-12-23 DIAGNOSIS — G3184 Mild cognitive impairment, so stated: Secondary | ICD-10-CM | POA: Diagnosis not present

## 2020-12-23 DIAGNOSIS — G43709 Chronic migraine without aura, not intractable, without status migrainosus: Secondary | ICD-10-CM | POA: Diagnosis not present

## 2020-12-23 DIAGNOSIS — Z8673 Personal history of transient ischemic attack (TIA), and cerebral infarction without residual deficits: Secondary | ICD-10-CM

## 2020-12-23 MED ORDER — NURTEC 75 MG PO TBDP: 75.0000 mg | ORAL_TABLET | ORAL | 5 refills | Status: DC | PRN

## 2020-12-23 MED ORDER — NURTEC 75 MG PO TBDP: 75.0000 mg | ORAL_TABLET | ORAL | 0 refills | Status: DC | PRN

## 2020-12-23 MED ORDER — UBRELVY 100 MG PO TABS: 100.0000 mg | ORAL_TABLET | ORAL | 5 refills | Status: DC | PRN

## 2020-12-23 MED ORDER — AMITRIPTYLINE HCL 10 MG PO TABS: 10.0000 mg | ORAL_TABLET | Freq: Every day | ORAL | 3 refills | Status: DC

## 2020-12-23 NOTE — Patient Instructions (Signed)
Your Plan:  Start [name] '75mg'$  as needed for acute migraine relief - only take 1 tablet per day  Continue amitriptyline '10mg'$  nightly for preventative management   Follow up in 6 months or call earlier if needed     Thank you for coming to see [place] at [facility]. I hope we have been able to provide you high quality care today.  You may receive a patient satisfaction survey over the next few weeks. We would appreciate your feedback and comments so that we may continue to improve ourselves and the health of our patients.

## 2020-12-23 NOTE — Progress Notes (Signed)
[facility] [address] [place]. [place] [address] [phone]       OFFICE FOLLOW UP VISIT NOTE  Ms. LEAYAH SHARP Date of Birth:  22-Sep-1950 Medical Record Number:  [id number]   Referring MD: Precious Haws Reason for Referral: Migraine f/u   Chief complaint: Chief Complaint  Patient presents with   Follow-up    Rm 3 alone Pt is well, has about 2-3 migraines a month on average, amitriptyline cause drowsiness    memory is stable.        HPI:  Today, 12/23/2020, Ms. Danis returns for overdue 69-monthfollow-up.  Migraines: Reports 2-3 migraines per month on amitriptyline 10 mg nightly tolerating without side effects.  Migraines can last usually all day -located frontal with throbbing sensation - will take tylenol with limited benefit. She has tried Depakote and venlafaxine in the past without benefit.  She has not previously tried emergent relief.  Memory loss: Stable. Maintaining ADLs and IADLs independently  History of stroke: Remains on aspirin and atorvastatin for secondary stroke prevention.  Blood pressure today 144/79.  Recent A1c 6.3.  Reports ongoing use of CPAP for OSA management.   C/o imbalance which has been progressive the past 3 months - when standing still, feels as if she is getting pulled but not in any specific direction. Can have dizziness associated at times but denies spinning type sensation.  Denies any visual changes or symptoms.  Denies symptoms previously. Does have neck pain which can cause headaches - does follow with ortho Dr. RNelva Bushfor neck and back issues receiving injections. Does have hx of likely diabetic neuropathy in feet and hands. She does plan on starting PT today.       History provided for reference purposes only Update 04/16/2020 JM: Ms. LBouiereturns for follow-up with prior complaints of memory concerns and headaches.  Initiated amitriptyline 25 mg at prior visit but unable to tolerate therefore decreased  to 10 mg nightly.  She has been doing very well without any recent migraine or headaches and tolerating low-dose amitriptyline without difficulty.  Reports improvement of cognitive impairment and maintains ADLs and IADLs independently.  History of stroke and continues on aspirin 81 mg daily and atorvastatin 20 mg daily without side effects.  Blood pressure today 142/76.  Reports ongoing use of CPAP for OSA management followed by pulmonology.  Reports b/l cataract surgery in October with improvement of vision and routinely follows with ophthalmology.  No concerns at this time.  Update 10/09/2019 JM: Ms. LLinzreturns for follow-up regarding memory concerns and headaches.  Since prior visit, she was evaluated in the ED on 07/14/2019 for intermittent episodes of right upper extremity numbness, tingling, balance issues and headache.  Headache resolved after migraine cocktail.  MRI unremarkable and was advised to follow-up outpatient.  She denies reoccurring transient episodes of numbness, tingling and balance but continues to experience daily headaches.  Longstanding history of headache with use of Depakote 500 mg twice daily and at prior visit recommended increasing dosage to 1000 mg twice daily but apparently was only taking once daily.  She was advised to increase to twice daily after ED admission as well as being started on venlafaxine Dr YKrista Bluewho was on-call provider at that time.  She reports since that time, headaches have been daily with at time 9/10 pain located "all over my head" associated with photophobia, phonophobia but denies nausea or vomiting.  She reports at times headache can be located occipital area with a pressure  type band sensation.  She states she attempted to take 1500 mg of Depakote spread out throughout the day but did not notice any benefit therefore currently only taking one 500 mg tablet daily in addition to venlafaxine 37.5 mg daily.  Due to continued headaches, she is questioning  change in medical management. She has not been on any other prior preventative medications.  She was recently found to have positive ANA and does have rheumatology evaluation on 7/1.  Blood pressure stable at 118/77 and does monitor at home and typically lower.  History of OSA with ongoing compliance of CPAP currently being followed by pulmonology.  Cognition has been stable without worsening.  No further concerns at this time.  Update 04/10/2019 Dr Leonie Man:  She returns for follow-up after last visit 4 months ago.  She states her subjective memory difficulties appear more or less unchanged.  She denies significant worsening of her symptoms or ability to do activities of daily living for herself.  She mostly struggles with recent information and short-term memory but long-term memory appears to be preserved.  She did undergo lab work for reversible causes of memory loss and 12/07/2018 and vitamin B12, TSH, RPR and homocystine were all normal.  EEG done on 01/15/2019 was normal.  MRI scan of the brain on 01/27/2019 was also normal.  She has not been participating regularly in cognitively challenging activities.  She has not been quite physically active either.  She has heard about provision over-the-counter and would like to try it.  She continues to have headaches but they are not as severe and occur about 3 or 4 times a week.  She is currently on Depakote ER 500 twice daily and it seems to help her and she is tolerating it well without any side effects.  She has no new complaints today.  Initial visit 12/07/2018 Dr. Leonie Man : Ms. Balog is a 70 year old pleasant African-American lady who is seen today for evaluation for memory difficulties.  History is obtained from the patient, review of electronic medical records and I personally reviewed imaging films in PACS.  She has a past medical history of breast cancer 2018 status post surgery, chemotherapy and radiation now in remission, chronic headaches, hypertension,  hyperlipidemia, hypothyroidism and chronic back pain.  She states for the last 3 months or so she is noticed some memory and cognitive difficulties.  She describes that mostly her short-term memory is poor but occasionally she has even some long-term memory difficulties.  She has trouble remembering names and recent events.  She has learned to write things down.  She has not noticed any trouble in driving and has never gotten lost.  She has been able to manage all her activities of daily living and continues to live alone.  She does admit to her mood being slightly under at times but denies depression.  She is never been on treatment for depression.  She has chronic headaches and has seen me several years ago and has been taking Depakote ER 500 twice daily and feels her headaches are under control she can only occasional headaches.  She denies any stroke, seizure, significant head injury with loss of consciousness.  There is no family history of dementia or cognitive problems.  She has not had any recent brain scan or lab work for memory loss done.  She has a prior history of abnormal MRI showing transient left medial temporal diffusion hyperintensity on 02/22/2015 of unclear significance which was found to have resolved on follow-up  MRI in 03/30/2015.  She underwent extensive work-up for stroke and seizures which was all negative.     ROS:   14 system review of systems is positive for no complaints and all other systems negative PMH:  Past Medical History:  Diagnosis Date   Anemia    Arthritis    "back" (07/18/2014)   Cancer of left breast (HCC)    Chronic back pain    Headache    Heart murmur    many years ago   Hyperlipidemia    Hypertension    Hypothyroidism    thyroidectomy 07/18/2014   Neuromuscular disorder ([place])    neuropathy hands and feet   Personal history of chemotherapy    Personal history of radiation therapy    Stroke ([place])    Type II diabetes mellitus ([place])     Social  History:  Social History   Socioeconomic History   Marital status: Divorced    Spouse name: Not on file   Number of children: 1   Years of education: Not on file   Highest education level: Some college, no degree  Occupational History   Not on file  Tobacco Use   Smoking status: Former    Packs/day: 0.25    Years: 5.00    Pack years: 1.25    Types: Cigarettes    Quit date: 05/24/1970    Years since quitting: 50.6   Smokeless tobacco: Never  Vaping Use   Vaping Use: Never used  Substance and Sexual Activity   Alcohol use: No    Comment: 07/18/2014 "quit drinking in the 1980's"   Drug use: Not Currently    Types: Marijuana    Comment: occassionally way back when   Sexual activity: Not on file  Other Topics Concern   Not on file  Social History Narrative   Lives at home alone   Right handed   Caffeine: 1 cup coffee daily   Social Determinants of Health   Financial Resource Strain: Not on file  Food Insecurity: Not on file  Transportation Needs: Not on file  Physical Activity: Not on file  Stress: Not on file  Social Connections: Not on file  Intimate Partner Violence: Not on file    Medications:   Current Outpatient Medications on File Prior to Visit  Medication Sig Dispense Refill   ACCU-CHEK AVIVA PLUS test strip      albuterol (VENTOLIN HFA) 108 (90 Base) MCG/ACT inhaler Inhale 2 puffs into the lungs every 6 (six) hours as needed for wheezing or shortness of breath. 18 g 11   amitriptyline (ELAVIL) 10 MG tablet Take 1 tablet (10 mg total) by mouth at bedtime. 90 tablet 3   amLODipine (NORVASC) 10 MG tablet Take 10 mg by mouth daily.     aspirin EC 81 MG tablet Take 81 mg by mouth daily.     atorvastatin (LIPITOR) 20 MG tablet Take 20 mg by mouth daily.      calcium-vitamin D (OSCAL WITH D) 500-200 MG-UNIT per tablet Take 2 tablets by mouth 2 (two) times daily. 60 tablet 1   celecoxib (CELEBREX) 200 MG capsule Take 200 mg by mouth daily as needed.     furosemide  (LASIX) 20 MG tablet Take 20 mg by mouth daily.      gabapentin (NEURONTIN) 300 MG capsule Take 300 mg by mouth 3 (three) times daily.      levothyroxine (SYNTHROID, LEVOTHROID) 88 MCG tablet Take 88 mcg by mouth daily before breakfast.  Multiple Vitamins-Minerals (MULTIVITAMIN WITH MINERALS) tablet Take 1 tablet by mouth daily.      olmesartan (BENICAR) 20 MG tablet Take 20 mg by mouth daily.     Semaglutide,0.25 or 0.'5MG'$ /DOS, 2 MG/1.5ML SOPN Inject into the skin.     Current Facility-Administered Medications on File Prior to Visit  Medication Dose Route Frequency Provider Last Rate Last Admin   dexamethasone (DECADRON) injection 2 mg  2 mg Intra-articular Once [place], Max T, DPM        Allergies:   Allergies  Allergen Reactions   Topamax [Topiramate] Other (See Comments)    CAUSED HAIR LOSS    Vitals Today's Vitals   12/23/20 0729  BP: (!) 144/79  Pulse: 86  Weight: 185 lb (83.9 kg)  Height: '5\' 2"'$  (1.575 m)    Body mass index is 33.84 kg/m.    Physical Exam General: well developed, well nourished pleasant middle-aged African-American lady, seated, in no evident distress Head: head normocephalic and atraumatic.   Neck: supple with no carotid or supraclavicular bruits Cardiovascular: regular rate and rhythm, no murmurs Musculoskeletal: no deformity Skin:  no rash/petichiae Vascular:  Normal pulses all extremities  Neurologic Exam Mental Status: Awake and fully alert.  Fluent speech and language.  Oriented to place and time. Recent and remote memory intact. Attention span, concentration and fund of knowledge appropriate. Mood and affect appropriate.    Cranial Nerves: Pupils equal, briskly reactive to light. Extraocular movements full without nystagmus. Visual fields full to confrontation. Hearing intact. Facial sensation intact. Face, tongue, palate moves normally and symmetrically.  Motor: Normal bulk and tone. Normal strength in all tested extremity muscles. Sensory.:  intact to touch , pinprick , position and vibratory sensation.  Coordination: Rapid alternating movements normal in all extremities. Finger-to-nose and heel-to-shin performed accurately bilaterally. Gait and Station: Arises from chair without difficulty. Stance is normal. Gait demonstrates normal stride length and balance without use of assistive device. Able to heel, toe and tandem walk with mild difficulty.  Romberg negative. Reflexes: 1+ and symmetric. Toes downgoing.       ASSESSMENT/PLAN: 70 year old African-American lady with PMH of HTN, HLD, DM, OSA on CPAP and prior stroke with subacute memory and mild cognitive difficulties likely due to mild cognitive impairment as well as recent worsening of chronic headaches.  Recent evaluation in ED on 07/14/2019 with transient left arm numbness/tingling, imbalance and headache with improvement of symptoms after migraine cocktail.  MRI unremarkable.  Chronic headaches likely mix of migraines without aura with some underlying tension related features which have since improved after initiating amitriptyline currently experiencing 2-3 migraine. Also c/o imbalance which has been gradually worsening over the past 2 to 3 months with chronic history of neck pain and diabetic neuropathy   -Continue amitriptyline 10 mg nightly for migraine prophylaxis -refill provided -start [name] 75 mg as needed for acute migraine relief -samples provided and if beneficial, order will be placed -No benefit with use of Depakote or venlafaxine -Discussed avoidance of migraine triggers and managing stressors -Advised to call with any worsening migraine headaches -Improvement of cognition - will continue to monitor -Continue to follow with PCP for secondary stroke prevention measures -imbalance likely in setting of worsening neck pain and diabetic neuropathy -neuro exam unremarkable and low suspicion for stroke etiology.  Plan to start working with PT today and advise ensure  routine follow-up with orthopedics for worsening neck pain   Follow-up in 6 months or call earlier if needed  CC:  [place] provider: Dr. Leonie Man  Bartholome Bill, MD    I spent 32 minutes of face-to-face and non-face-to-face time with patient.  This included previsit chart review, lab review, study review, order entry, electronic health record documentation, patient education regarding migraine headaches, ongoing use of amitriptyline and use of emergent therapy, cognitive impairment, history of stroke, imbalance complaints and likely etiology and answered all questions to patient satisfaction  Frann Rider, AGNP-BC  [facility] [address] [place], [place] [address]  Phone [phone] Fax [phone] Note: This document was prepared with digital dictation and possible smart phrase technology. Any transcriptional errors that result from this process are unintentional.

## 2020-12-23 NOTE — Telephone Encounter (Signed)
FYI-Pt called wanting to let the provider know that the Rimegepant Sulfate ([name]) 75 MG TBDP did work for her.

## 2020-12-25 NOTE — Progress Notes (Signed)
I agree with the above plan 

## 2020-12-31 ENCOUNTER — Ambulatory Visit: Payer: Medicare Other | Admitting: Gastroenterology

## 2021-01-12 NOTE — Progress Notes (Signed)
Patient Care Team: Bartholome Bill, MD as PCP - General (Family Medicine)  DIAGNOSIS:    ICD-10-CM   1. Malignant neoplasm of upper-outer quadrant of left breast in female, estrogen receptor positive ([place])  C50.412    Z17.0       SUMMARY OF ONCOLOGIC HISTORY: Oncology History  Breast cancer of upper-outer quadrant of left female breast ([place])  03/23/2007 Initial Diagnosis   Breast cancer of upper-outer quadrant of left female breast   05/09/2007 Surgery   Left mastectomy T2, N1, M0 stage IIB ER/PR positive HER-2 negative (based on patient's description.- no records available)   06/13/2007 - 08/02/2007 Chemotherapy   Taxotere Cytoxan x4   09/21/2007 - 11/01/2007 Radiation Therapy   Adjuvant radiation therapy   11/28/2007 - 03/22/2014 Anti-estrogen oral therapy   Arimidex 1 mg daily   07/18/2014 Surgery   total thyroidectomy: Benign multinodular goiter     CHIEF COMPLIANT: Follow-up of left breast cancer  INTERVAL HISTORY: Jeanette [name] is a 70 y.o. with above-mentioned history of left breast cancer treated with mastectomy, adjuvant chemotherapy, radiation, and 6 years of antiestrogen therapy with anastrozole. She is currently on surveillance. Mammogram on 05/13/20 showed no evidence of malignancy in the right breast. She presents to the clinic today for annual follow-up.   ALLERGIES:  is allergic to topamax [topiramate].  MEDICATIONS:  Current Outpatient Medications  Medication Sig Dispense Refill   ACCU-CHEK AVIVA PLUS test strip      albuterol (VENTOLIN HFA) 108 (90 Base) MCG/ACT inhaler Inhale 2 puffs into the lungs every 6 (six) hours as needed for wheezing or shortness of breath. 18 g 11   amitriptyline (ELAVIL) 10 MG tablet Take 1 tablet (10 mg total) by mouth at bedtime. 90 tablet 3   amLODipine (NORVASC) 10 MG tablet Take 10 mg by mouth daily.     aspirin EC 81 MG tablet Take 81 mg by mouth daily.     atorvastatin (LIPITOR) 20 MG tablet Take 20 mg by mouth  daily.      calcium-vitamin D (OSCAL WITH D) 500-200 MG-UNIT per tablet Take 2 tablets by mouth 2 (two) times daily. 60 tablet 1   celecoxib (CELEBREX) 200 MG capsule Take 200 mg by mouth daily as needed.     furosemide (LASIX) 20 MG tablet Take 20 mg by mouth daily.      gabapentin (NEURONTIN) 300 MG capsule Take 300 mg by mouth 3 (three) times daily.      levothyroxine (SYNTHROID, LEVOTHROID) 88 MCG tablet Take 88 mcg by mouth daily before breakfast.     Multiple Vitamins-Minerals (MULTIVITAMIN WITH MINERALS) tablet Take 1 tablet by mouth daily.      olmesartan (BENICAR) 20 MG tablet Take 20 mg by mouth daily.     Rimegepant Sulfate ([name]) 75 MG TBDP Take 75 mg by mouth as needed (acute migraine relief). 6 tablet 0   Rimegepant Sulfate ([name]) 75 MG TBDP Take 75 mg by mouth as needed (For acute migraine relief. max dose 75 mg/24 hrs). 16 tablet 5   Semaglutide,0.25 or 0.5MG/DOS, 2 MG/1.5ML SOPN Inject into the skin.     Current Facility-Administered Medications  Medication Dose Route Frequency Provider Last Rate Last Admin   dexamethasone (DECADRON) injection 2 mg  2 mg Intra-articular Once Hyatt, Max T, DPM        PHYSICAL EXAMINATION: ECOG PERFORMANCE STATUS: 1 - Symptomatic but completely ambulatory  Vitals:   01/13/21 1406  BP: (!) 111/58  Pulse: 77  Resp:  18  Temp: 97.7 F (36.5 C)  SpO2: 99%   Filed Weights   01/13/21 1406  Weight: 185 lb 4.8 oz (84.1 kg)    BREAST: No palpable masses or nodules in either right or left breasts. No palpable axillary supraclavicular or infraclavicular adenopathy no breast tenderness or nipple discharge. (exam performed in the presence of a chaperone)  LABORATORY DATA:  I have reviewed the data as listed CMP Latest Ref Rng & Units 07/15/2019 05/04/2017 05/04/2017  Glucose 70 - 99 mg/dL 73 127(H) 104(H)  BUN 8 - 23 mg/dL 24(H) 12 9  Creatinine 0.44 - 1.00 mg/dL 1.63(H) 1.19(H) 1.00  Sodium 135 - 145 mmol/L 141 138 145  Potassium 3.5 -  5.1 mmol/L 4.5 3.7 3.7  Chloride 98 - 111 mmol/L 107 104 105  CO2 22 - 32 mmol/L 27 26 -  Calcium 8.9 - 10.3 mg/dL 8.8(L) 9.3 -  Total Protein 6.4 - 8.3 g/dL - - -  Total Bilirubin 0.20 - 1.20 mg/dL - - -  Alkaline Phos 40 - 150 U/L - - -  AST 5 - 34 U/L - - -  ALT 0 - 55 U/L - - -    Lab Results  Component Value Date   WBC 6.8 07/15/2019   HGB 12.6 07/15/2019   HCT 40.8 07/15/2019   MCV 87.4 07/15/2019   PLT 250 07/15/2019   NEUTROABS 2.8 07/15/2019    ASSESSMENT & PLAN:  Breast cancer of upper-outer quadrant of left female breast Left breast cancer status post left mastectomy December 2000 and T2, N1, M0 stage IIB ER/PR positive HER-2 negative followed by adjuvant chemotherapy and radiation started Arimidex July 2009 and completed 03/22/2014   Breast Cancer Surveillance: 1. Breast exam : 01/13/21: Benign 2. Mammogram right breast:05/15/20: Benign breast density category B.    Follow-up once a year for follow-up      No orders of the defined types were placed in this encounter.  The patient has a good understanding of the overall plan. she agrees with it. she will call with any problems that may develop before the next visit here.  Total time spent: 20 mins including face to face time and time spent for planning, charting and coordination of care  Rulon Eisenmenger, MD, MPH 01/13/2021  I, Thana Ates, am acting as scribe for Dr. Nicholas Lose.  I have reviewed the above documentation for accuracy and completeness, and I agree with the above.

## 2021-01-12 NOTE — Assessment & Plan Note (Signed)
Left breast cancer status post left mastectomy December 2000 and T2, N1, M0 stage IIB ER/PR positive HER-2 negative followed by adjuvant chemotherapy and radiation started Arimidex July 2009 and completed 03/22/2014  Breast Cancer Surveillance: 1. Breast exam : 01/13/21: Benign 2. Mammogramright breast:05/15/20: Benign breast density category B.  Follow-up once a yearfor follow-up

## 2021-01-13 ENCOUNTER — Other Ambulatory Visit: Payer: Self-pay

## 2021-01-13 ENCOUNTER — Inpatient Hospital Stay: Payer: Medicare Other | Attending: Hematology and Oncology | Admitting: Hematology and Oncology

## 2021-01-13 DIAGNOSIS — Z79811 Long term (current) use of aromatase inhibitors: Secondary | ICD-10-CM | POA: Diagnosis not present

## 2021-01-13 DIAGNOSIS — Z923 Personal history of irradiation: Secondary | ICD-10-CM | POA: Diagnosis not present

## 2021-01-13 DIAGNOSIS — E042 Nontoxic multinodular goiter: Secondary | ICD-10-CM | POA: Insufficient documentation

## 2021-01-13 DIAGNOSIS — Z170 Estrogen receptor positive status [ER+]: Secondary | ICD-10-CM | POA: Diagnosis not present

## 2021-01-13 DIAGNOSIS — Z9012 Acquired absence of left breast and nipple: Secondary | ICD-10-CM | POA: Diagnosis not present

## 2021-01-13 DIAGNOSIS — Z7982 Long term (current) use of aspirin: Secondary | ICD-10-CM | POA: Diagnosis not present

## 2021-01-13 DIAGNOSIS — Z79899 Other long term (current) drug therapy: Secondary | ICD-10-CM | POA: Diagnosis not present

## 2021-01-13 DIAGNOSIS — Z7952 Long term (current) use of systemic steroids: Secondary | ICD-10-CM | POA: Diagnosis not present

## 2021-01-13 DIAGNOSIS — C50412 Malignant neoplasm of upper-outer quadrant of left female breast: Secondary | ICD-10-CM | POA: Diagnosis not present

## 2021-01-15 ENCOUNTER — Telehealth: Payer: Self-pay | Admitting: Hematology and Oncology

## 2021-01-15 NOTE — Telephone Encounter (Signed)
Scheduled per 8/23 los. Called and spoke with pt confirmed 8/29 appt

## 2021-01-20 ENCOUNTER — Other Ambulatory Visit (INDEPENDENT_AMBULATORY_CARE_PROVIDER_SITE_OTHER): Payer: Medicare Other

## 2021-01-20 ENCOUNTER — Encounter: Payer: Self-pay | Admitting: Gastroenterology

## 2021-01-20 ENCOUNTER — Ambulatory Visit (INDEPENDENT_AMBULATORY_CARE_PROVIDER_SITE_OTHER): Payer: Medicare Other | Admitting: Gastroenterology

## 2021-01-20 VITALS — BP 112/70 | HR 81 | Ht 62.0 in | Wt 183.4 lb

## 2021-01-20 DIAGNOSIS — K5900 Constipation, unspecified: Secondary | ICD-10-CM

## 2021-01-20 DIAGNOSIS — R1030 Lower abdominal pain, unspecified: Secondary | ICD-10-CM | POA: Diagnosis not present

## 2021-01-20 LAB — COMPREHENSIVE METABOLIC PANEL
ALT: 23 U/L (ref 0–35)
AST: 27 U/L (ref 0–37)
Albumin: 4.3 g/dL (ref 3.5–5.2)
Alkaline Phosphatase: 77 U/L (ref 39–117)
BUN: 15 mg/dL (ref 6–23)
CO2: 28 mEq/L (ref 19–32)
Calcium: 9.9 mg/dL (ref 8.4–10.5)
Chloride: 105 mEq/L (ref 96–112)
Creatinine, Ser: 1.25 mg/dL — ABNORMAL HIGH (ref 0.40–1.20)
GFR: 44.2 mL/min — ABNORMAL LOW (ref >60.00)
Glucose, Bld: 105 mg/dL — ABNORMAL HIGH (ref 70–99)
Potassium: 4.1 mEq/L (ref 3.5–5.1)
Sodium: 142 mEq/L (ref 135–145)
Total Bilirubin: 0.7 mg/dL (ref 0.2–1.2)
Total Protein: 7.3 g/dL (ref 6.0–8.3)

## 2021-01-20 LAB — CBC WITH DIFFERENTIAL/PLATELET
Basophils Absolute: 0 10*3/uL (ref 0.0–0.1)
Basophils Relative: 0.6 % (ref 0.0–3.0)
Eosinophils Absolute: 0.1 10*3/uL (ref 0.0–0.7)
Eosinophils Relative: 2.3 % (ref 0.0–5.0)
HCT: 41.6 % (ref 36.0–46.0)
Hemoglobin: 13.5 g/dL (ref 12.0–15.0)
Lymphocytes Relative: 34.5 % (ref 12.0–46.0)
Lymphs Abs: 2 10*3/uL (ref 0.7–4.0)
MCHC: 32.5 g/dL (ref 30.0–36.0)
MCV: 83.8 fl (ref 78.0–100.0)
Monocytes Absolute: 0.4 10*3/uL (ref 0.1–1.0)
Monocytes Relative: 7.4 % (ref 3.0–12.0)
Neutro Abs: 3.2 10*3/uL (ref 1.4–7.7)
Neutrophils Relative %: 55.2 % (ref 43.0–77.0)
Platelets: 189 10*3/uL (ref 150.0–400.0)
RBC: 4.96 Mil/uL (ref 3.87–5.11)
RDW: 13.9 % (ref 11.5–15.5)
WBC: 5.8 10*3/uL (ref 4.0–10.5)

## 2021-01-20 LAB — TSH: TSH: 1.68 u[IU]/mL (ref 0.35–5.50)

## 2021-01-20 NOTE — Patient Instructions (Addendum)
Your provider has requested that you go to the basement level for lab work before leaving today. Press "B" on the elevator. The lab is located at the first door on the left as you exit the elevator.  Please start the samples of Trulance taking one tablet by mouth daily. After finishing samples please contact our office with a symptom update and so we can send in a prescription to your pharmacy.  You have been scheduled for a CT scan of the abdomen and pelvis at [facility] ([address]---this is in the same building as [employer]).   You are scheduled on 01/27/21 at 1:00pm. You should arrive 15 minutes prior to your appointment time for registration. Please follow the written instructions below on the day of your exam:  WARNING: IF YOU ARE ALLERGIC TO IODINE/X-RAY DYE, PLEASE NOTIFY RADIOLOGY IMMEDIATELY AT [phone]! YOU WILL BE GIVEN A 13 HOUR PREMEDICATION PREP.  1) Do not eat anything after 9:00am (4 hours prior to your test) 2) You have been given 2 bottles of oral contrast to drink. The solution may taste better if refrigerated, but do NOT add ice or any other liquid to this solution. Shake well before drinking.    Drink 1 bottle of contrast @ 11:00am (2 hours prior to your exam)  Drink 1 bottle of contrast @ 12:00pm (1 hour prior to your exam)  You may take any medications as prescribed with a small amount of water, if necessary. If you take any of the following medications: METFORMIN, GLUCOPHAGE, GLUCOVANCE, AVANDAMET, RIOMET, FORTAMET, [place] MET, JANUMET, GLUMETZA or METAGLIP, you MAY be asked to HOLD this medication 48 hours AFTER the exam.  The purpose of you drinking the oral contrast is to aid in the visualization of your intestinal tract. The contrast solution may cause some diarrhea. Depending on your individual set of symptoms, you may also receive an intravenous injection of x-ray contrast/dye. Plan on being at [facility] for 30 minutes or  longer, depending on the type of exam you are having performed.  This test typically takes 30-45 minutes to complete.  If you have any questions regarding your exam or if you need to reschedule, you may call the CT department at [phone] between the hours of 8:00 am and 5:00 pm, Monday-Friday.  __________________________________________________________Due to recent changes in healthcare laws, you may see the results of your imaging and laboratory studies on MyChart before your provider has had a chance to review them.  We understand that in some cases there may be results that are confusing or concerning to you. Not all laboratory results come back in the same time frame and the provider may be waiting for multiple results in order to interpret others.  Please give [place] 48 hours in order for your provider to thoroughly review all the results before contacting the office for clarification of your results.   The [place] [facility] providers would like to encourage you to use [facility] to communicate with providers for non-urgent requests or questions.  Due to long hold times on the telephone, sending your provider a message by [facility] may be a faster and more efficient way to get a response.  Please allow 48 business hours for a response.  Please remember that this is for non-urgent requests.   Thank you for choosing me and [facility].  Pricilla Riffle. Dagoberto Ligas., MD., [facility]val Regal

## 2021-01-20 NOTE — Progress Notes (Signed)
    History of Present Illness: This is a 70 year old female with persistent problems with lower abdominal pain constipation and fecal incontinence.  She has been unable to find a consistent dose of MiraLAX that relieves her constipation without resulting in looser stools with fecal incontinence.  She notes persistent problems with abdominal bloating.  Current Medications, Allergies, Past Medical History, Past Surgical History, Family History and Social History were reviewed in [employer] record.   Physical Exam: General: Well developed, well nourished, no acute distress Head: Normocephalic and atraumatic Eyes: Sclerae anicteric, EOMI Ears: Normal auditory acuity Mouth: Not examined, mask on during Covid-19 pandemic Lungs: Clear throughout to auscultation Heart: Regular rate and rhythm; no murmurs, rubs or bruits Abdomen: Soft, mild lower abdominal tenderness and non distended. No masses, hepatosplenomegaly or hernias noted. Normal Bowel sounds Rectal: See 11/14/2020 exam Musculoskeletal: Symmetrical with no gross deformities  Pulses:  Normal pulses noted Extremities: No clubbing, cyanosis, edema or deformities noted Neurological: Alert oriented x 4, grossly nonfocal Psychological:  Alert and cooperative. Normal mood and affect   Assessment and Recommendations:  Constipation, lower abdominal pain, fecal incontinence.  Discussed anorectal manometry, CTAP and a trial of Trulance 3 mg daily.  Schedule CT AP and begin Trulance 3 mg daily samples for 9 days.  She is advised to call or report progress after completing her 9-day supply of samples.  Continue Kegel exercises 5 times daily.  Consider anorectal manometry after next REV in 6 weeks.

## 2021-01-27 ENCOUNTER — Other Ambulatory Visit: Payer: Self-pay

## 2021-01-27 ENCOUNTER — Ambulatory Visit (INDEPENDENT_AMBULATORY_CARE_PROVIDER_SITE_OTHER)
Admission: RE | Admit: 2021-01-27 | Discharge: 2021-01-27 | Disposition: A | Discharge status: Home / Self Care | Payer: Medicare Other | Source: Ambulatory Visit | Attending: Gastroenterology | Admitting: Gastroenterology

## 2021-01-27 DIAGNOSIS — R1030 Lower abdominal pain, unspecified: Secondary | ICD-10-CM | POA: Diagnosis not present

## 2021-01-27 DIAGNOSIS — K5900 Constipation, unspecified: Secondary | ICD-10-CM

## 2021-01-27 MED ORDER — IOHEXOL 300 MG/ML  SOLN
100.0000 mL | Freq: Once | INTRAMUSCULAR | Status: AC | PRN
  Administered 2021-01-27: 80 mL via INTRAVENOUS

## 2021-01-29 ENCOUNTER — Other Ambulatory Visit: Payer: Self-pay

## 2021-01-29 ENCOUNTER — Telehealth: Payer: Self-pay | Admitting: Gastroenterology

## 2021-01-29 DIAGNOSIS — N816 Rectocele: Secondary | ICD-10-CM

## 2021-01-29 NOTE — Telephone Encounter (Signed)
See results notes for details.  

## 2021-01-29 NOTE — Telephone Encounter (Signed)
Pls call pt. She needs to know the name of the medication for constipation and information for the women's office that she is going to be referred to.

## 2021-01-29 NOTE — Telephone Encounter (Signed)
Inbound call from patient to give update on medication trulance. States she is still experiencing severe constipation and abd pain.

## 2021-01-30 ENCOUNTER — Other Ambulatory Visit: Payer: Self-pay

## 2021-01-30 MED ORDER — LINACLOTIDE 145 MCG PO CAPS: 145.0000 ug | ORAL_CAPSULE | Freq: Every day | ORAL | 1 refills | Status: DC

## 2021-03-10 ENCOUNTER — Telehealth: Payer: Self-pay

## 2021-03-10 ENCOUNTER — Encounter: Payer: Self-pay | Admitting: Gastroenterology

## 2021-03-10 ENCOUNTER — Other Ambulatory Visit: Payer: Self-pay | Admitting: Gastroenterology

## 2021-03-10 ENCOUNTER — Ambulatory Visit (INDEPENDENT_AMBULATORY_CARE_PROVIDER_SITE_OTHER): Payer: Medicare Other | Admitting: Gastroenterology

## 2021-03-10 VITALS — BP 102/58 | HR 96 | Ht 62.0 in | Wt 185.0 lb

## 2021-03-10 DIAGNOSIS — K5909 Other constipation: Secondary | ICD-10-CM

## 2021-03-10 DIAGNOSIS — N816 Rectocele: Secondary | ICD-10-CM

## 2021-03-10 DIAGNOSIS — R159 Full incontinence of feces: Secondary | ICD-10-CM | POA: Diagnosis not present

## 2021-03-10 DIAGNOSIS — R152 Fecal urgency: Secondary | ICD-10-CM

## 2021-03-10 MED ORDER — LINACLOTIDE 290 MCG PO CAPS: 290.0000 ug | ORAL_CAPSULE | Freq: Every day | ORAL | 11 refills | Status: DC

## 2021-03-10 NOTE — Progress Notes (Signed)
    History of Present Illness: This is a 70 year old female with persistent problems with constipation lower abdominal pain.  Linzess 290 mcg daily still not adequate.  She has a bowel movement about every 4 days.  We reviewed the results of her CT scan from January 27, 2021  CT AP 01/27/2021 1. Large volume of formed stool in the colon with gas fluid levels in the ascending colon, suggestive of constipation. No suspicious colonic wall thickening or mass like lesions. 2. Pelvic floor laxity with a rectocele. 3. Mild wall thickening of an incompletely distended urinary bladder. Correlate with urinalysis to exclude cystitis. 4.  Aortic Atherosclerosis   Current Medications, Allergies, Past Medical History, Past Surgical History, Family History and Social History were reviewed in [employer] record.   Physical Exam: General: Well developed, well nourished, no acute distress Head: Normocephalic and atraumatic Eyes: Sclerae anicteric, EOMI Ears: Normal auditory acuity Mouth: Not examined, mask on during Covid-19 pandemic Neurological: Alert oriented x 4, grossly nonfocal Psychological:  Alert and cooperative. Normal mood and affect   Assessment and Recommendations:  Chronic constipation.  Continue Linzess 290 mcg daily and add MiraLAX titrated between one half scoop/day to 3 scoops/day for a complete bowel movement at least every other day.  If this is not effective she can discontinue MiraLAX and change to Dulcolax tablets 1-3 daily titrated for an adequate bowel movement. REV in 6 weeks.  Fecal incontinence.  Assess response to adequate treatment of constipation.  Continue Kegel exercises at least 5 times daily long-term. Rectocele and pelvic floor laxity.  See #2.  GYN referral for further evaluation and management.

## 2021-03-10 NOTE — Telephone Encounter (Signed)
PA for [name] has been sent.   (KeySherrin Daisy)  Your information has been sent to [facility].

## 2021-03-10 NOTE — Patient Instructions (Signed)
Finish taking Linzess 145 mcg 2 tablets daily.  We have sent the following medications to your pharmacy for you to pick up at your convenience: Linzess 290 mcg daily.   Slowly increase Miralax until you have found the correct dose to help with constipation. If you want to take dulcolax in it's place then start 1-2 tablets daily while taking Linzess daily.   Due to recent changes in healthcare laws, you may see the results of your imaging and laboratory studies on MyChart before your provider has had a chance to review them.  We understand that in some cases there may be results that are confusing or concerning to you. Not all laboratory results come back in the same time frame and the provider may be waiting for multiple results in order to interpret others.  Please give [place] 48 hours in order for your provider to thoroughly review all the results before contacting the office for clarification of your results.   The [place] [facility] providers would like to encourage you to use [facility] to communicate with providers for non-urgent requests or questions.  Due to long hold times on the telephone, sending your provider a message by [facility] may be a faster and more efficient way to get a response.  Please allow 48 business hours for a response.  Please remember that this is for non-urgent requests.   Thank you for choosing me and [facility].  Pricilla Riffle. Dagoberto Ligas., MD., Marval Regal

## 2021-03-25 ENCOUNTER — Telehealth: Payer: Self-pay | Admitting: Gastroenterology

## 2021-03-25 NOTE — Telephone Encounter (Signed)
Patient called states she has been having a lot of burning lower abdominal area and is also having uncontrollable BM's seeking help.

## 2021-03-25 NOTE — Telephone Encounter (Signed)
Returned patient's call and she states her lower abdominal pain and constipation (with small amts. Of liquid incontinence) has not improved. She is taking the Linzess daily, but has been hesitant to take the Miralax because she is afraid of increased Incontinence. I asked her to take a day that she can stay at home and take 3 doses of Miralax, as Dr. Fuller Plan had recommended in her office visit. Then once she empties out, go back to just Linzess (with 2 tabs of Dulcolax,  if needed)  to treat her constipation. Patient has F/U appt. With Dr. Fuller Plan on 04/28/21.

## 2021-04-02 ENCOUNTER — Encounter: Payer: Self-pay | Admitting: Obstetrics and Gynecology

## 2021-04-02 ENCOUNTER — Other Ambulatory Visit: Payer: Self-pay

## 2021-04-02 ENCOUNTER — Ambulatory Visit (INDEPENDENT_AMBULATORY_CARE_PROVIDER_SITE_OTHER): Payer: Medicare Other | Admitting: Obstetrics and Gynecology

## 2021-04-02 DIAGNOSIS — N816 Rectocele: Secondary | ICD-10-CM

## 2021-04-02 NOTE — Progress Notes (Signed)
NEW GYN presents for referral from [facility] for Rectocele.

## 2021-04-02 NOTE — Progress Notes (Signed)
CC: rectocele Subjective:    Patient ID: Jeanette [name], female    DOB: 1951/05/15, 70 y.o.   MRN: [id number]  HPI 70 yo G1P1 seen for evaluation of rectocele seen on CT scam.  Pt was referred by her GI physician .  Pt is s/p TAHBSO due to fibroids in the 1980s.  The patient notes hx of lifelong constipation and 3 month hx of fecal incontinence.  She notes incontinence with cough, laugh or singing.  She denies any vaginal splinting to aid in defecation.   Review of Systems  Gastrointestinal:  Positive for constipation.       Fecal incontinence      Objective:   Physical Exam Constitutional:      Appearance: Normal appearance.  HENT:     Head: Normocephalic and atraumatic.  Cardiovascular:     Rate and Rhythm: Normal rate and regular rhythm.  Pulmonary:     Effort: Pulmonary effort is normal.     Breath sounds: Normal breath sounds.  Abdominal:     General: Abdomen is flat.     Palpations: Abdomen is soft.     Comments: Mild epigastric tenderness  Genitourinary:    Comments: Cervix absent, vagina WNL No obvious rectocele with valsalva.  No rectocele palpated Neurological:     Mental Status: She is alert.   Vitals:   04/02/21 1018  BP: 120/85  Pulse: 90   CLINICAL DATA:  Three months of fecal incontinence with lower abdominal pain. History of breast cancer in 2000 a with mastectomy chemo and radiation.   EXAM: CT ABDOMEN AND PELVIS WITH CONTRAST   TECHNIQUE: Multidetector CT imaging of the abdomen and pelvis was performed using the standard protocol following bolus administration of intravenous contrast.   CONTRAST:  74mL OMNIPAQUE IOHEXOL 300 MG/ML  SOLN   COMPARISON:  None.   FINDINGS: Lower chest: No acute abnormality.   Hepatobiliary: No suspicious hepatic lesion. Gallbladder is unremarkable. No biliary ductal dilation.   Pancreas: Unremarkable. No pancreatic ductal dilatation or surrounding inflammatory changes.   Spleen: Normal in size without  focal abnormality.   Adrenals/Urinary Tract: Adrenal glands are unremarkable. Kidneys are normal, without renal calculi, solid enhancing lesion, or hydronephrosis. Mild wall thickening of an incompletely distended urinary bladder.   Stomach/Bowel: Stomach is distended with ingested material without focal wall thickening. No pathologic dilation of small or large bowel. The appendix and terminal ileum appear unremarkable. Large volume of formed stool in the colon with gas fluid levels in the ascending colon. No suspicious colonic wall thickening or mass like lesions.   Vascular/Lymphatic: Scattered aortic atherosclerosis without abdominal aortic aneurysm. No pathologically enlarged abdominal or pelvic lymph nodes.   Reproductive: Status post hysterectomy. No adnexal masses. No abdominopelvic   Other: Pelvic floor laxity with a rectocele. No abdominopelvic free fluid.   Musculoskeletal: Multilevel degenerative changes spine. Degenerative changes bilateral hips. 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   IMPRESSION: 1. Large volume of formed stool in the colon with gas fluid levels in the ascending colon, suggestive of constipation. No suspicious colonic wall thickening or mass like lesions. 2. Pelvic floor laxity with a rectocele. 3. Mild wall thickening of an incompletely distended urinary bladder. Correlate with urinalysis to exclude cystitis. 4.  Aortic Atherosclerosis (ICD10-I70.0).          Assessment & Plan:   1. Rectocele I believe the rectocele was more of an incidental finding on the CT scan.  I do not believe that the rectocele  is involved in the fecal incontinence, and this is not a usual symptom of rectocele.  If further evaluation is needed, I would recommend evaluation with urogynecology,  Dr. Sherlene Shams.  F/u prn   Griffin Basil, MD Faculty Attending, [facility] for [facility]

## 2021-04-13 ENCOUNTER — Other Ambulatory Visit: Payer: Self-pay | Admitting: Family Medicine

## 2021-04-13 DIAGNOSIS — Z1231 Encounter for screening mammogram for malignant neoplasm of breast: Secondary | ICD-10-CM

## 2021-04-21 ENCOUNTER — Telehealth: Payer: Self-pay | Admitting: Adult Health

## 2021-04-21 NOTE — Telephone Encounter (Signed)
Pt request refill for Rimegepant Sulfate ([name]) 75 MG TBDP at [id number]

## 2021-04-21 NOTE — Telephone Encounter (Signed)
Called patient and informed her that she has [name] refills until Feb 2023. Patient verbalized understanding, appreciation.

## 2021-04-28 ENCOUNTER — Ambulatory Visit: Payer: Medicare Other | Admitting: Gastroenterology

## 2021-05-03 ENCOUNTER — Other Ambulatory Visit: Payer: Self-pay | Admitting: Gastroenterology

## 2021-05-14 ENCOUNTER — Other Ambulatory Visit: Payer: Self-pay

## 2021-05-14 ENCOUNTER — Ambulatory Visit
Admission: RE | Admit: 2021-05-14 | Discharge: 2021-05-14 | Disposition: A | Discharge status: Home / Self Care | Payer: Medicare Other | Source: Ambulatory Visit | Attending: Family Medicine | Admitting: Family Medicine

## 2021-05-14 DIAGNOSIS — Z1231 Encounter for screening mammogram for malignant neoplasm of breast: Secondary | ICD-10-CM

## 2021-06-02 ENCOUNTER — Other Ambulatory Visit: Payer: Self-pay | Admitting: Gastroenterology

## 2021-06-23 ENCOUNTER — Ambulatory Visit: Payer: Medicare Other | Admitting: Adult Health

## 2021-07-11 ENCOUNTER — Other Ambulatory Visit: Payer: Self-pay | Admitting: Pulmonary Disease

## 2021-07-11 DIAGNOSIS — R062 Wheezing: Secondary | ICD-10-CM

## 2021-08-13 ENCOUNTER — Ambulatory Visit: Payer: Medicare Other | Admitting: Adult Health

## 2021-09-03 ENCOUNTER — Ambulatory Visit: Payer: Medicare Other | Admitting: Adult Health

## 2021-09-28 ENCOUNTER — Other Ambulatory Visit: Payer: Self-pay | Admitting: Adult Health

## 2021-09-28 DIAGNOSIS — G43709 Chronic migraine without aura, not intractable, without status migrainosus: Secondary | ICD-10-CM

## 2021-11-10 ENCOUNTER — Ambulatory Visit: Payer: Medicare Other | Admitting: Gastroenterology

## 2021-11-12 ENCOUNTER — Ambulatory Visit: Payer: Medicare Other | Admitting: Adult Health

## 2021-11-26 ENCOUNTER — Ambulatory Visit (INDEPENDENT_AMBULATORY_CARE_PROVIDER_SITE_OTHER): Payer: 59

## 2021-11-26 ENCOUNTER — Ambulatory Visit (INDEPENDENT_AMBULATORY_CARE_PROVIDER_SITE_OTHER): Payer: 59 | Admitting: Podiatry

## 2021-11-26 DIAGNOSIS — E1149 Type 2 diabetes mellitus with other diabetic neurological complication: Secondary | ICD-10-CM

## 2021-11-26 DIAGNOSIS — E1140 Type 2 diabetes mellitus with diabetic neuropathy, unspecified: Secondary | ICD-10-CM

## 2021-11-26 DIAGNOSIS — M21612 Bunion of left foot: Secondary | ICD-10-CM

## 2021-11-26 NOTE — Progress Notes (Signed)
Patient presents to the office today for diabetic shoe and insole measuring.  Patient was measured with brannock device to determine size and width for 1 pair of extra depth shoes and foam casted for 3 pair of insoles.   Documentation of medical necessity will be sent to patient's treating diabetic doctor to verify and sign.   Patient's diabetic provider: Precious Haws, MD  Shoes and insoles will be ordered at that time and patient will be notified for an appointment for fitting when they arrive.   Shoe size (per patient): 9 medium Women's    Patient shoe selection-   1st choice:   [id number] Apex Walker  2nd choice:  [place] size ordered: 9 medium Women's

## 2021-11-26 NOTE — Progress Notes (Signed)
Subjective:   Patient ID: Jeanette [name], female   DOB: 71 y.o.   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  Patient was seen for this today

## 2021-12-01 ENCOUNTER — Encounter: Payer: Self-pay | Admitting: Gastroenterology

## 2021-12-01 ENCOUNTER — Ambulatory Visit (INDEPENDENT_AMBULATORY_CARE_PROVIDER_SITE_OTHER): Payer: 59 | Admitting: Gastroenterology

## 2021-12-01 VITALS — BP 112/64 | HR 79 | Ht 62.0 in | Wt 174.0 lb

## 2021-12-01 DIAGNOSIS — K5909 Other constipation: Secondary | ICD-10-CM

## 2021-12-01 DIAGNOSIS — R159 Full incontinence of feces: Secondary | ICD-10-CM

## 2021-12-01 NOTE — Patient Instructions (Signed)
Adjust your Miralax to 1/2 to 1 scoop daily.   Call our office if your symptoms have not improved in 2-3 weeks.   The [place] [facility] providers would like to encourage you to use [facility] to communicate with providers for non-urgent requests or questions.  Due to long hold times on the telephone, sending your provider a message by [facility] may be a faster and more efficient way to get a response.  Please allow 48 business hours for a response.  Please remember that this is for non-urgent requests.   Thank you for choosing me and [facility].  Pricilla Riffle. Dagoberto Ligas., MD., Marval Regal

## 2021-12-01 NOTE — Progress Notes (Signed)
    Assessment     Chronic constipation Intermittent fecal incontinence   Recommendations    Continue Linzess 290 mcg daily, Metamucil daily, Colace daily, and titrate the dose of MiraLAX between one half and 1 scoop daily to achieve a complete bowel movement daily.  If this regimen is not sufficient she is advised to call for further advice and consider a change from [place].   HPI    This is a 71 year old female with chronic constipation and intermittent fecal incontinence.  She relates that it is typically 3 to 4 days between bowel movements and after her bowels move she will tend to have urgency with fecal incontinence.  She has been working with pelvic floor physical therapy and she has been evaluated by her gynecologist about her rectocele.  Despite taking Linzess a stool softener and Metamucil daily typically 3 to 4 days between bowel movements.  She relates that taking a full dose of MiraLAX leads to looser stools with incontinence so she tries to avoid taking MiraLAX.  Colonoscopy in 2019 as below with 2 small hyperplastic polyps.    Colonoscopy April 2019 - Two 5 to 6 mm polyps in the sigmoid colon, removed with a cold snare. Resected and retrieved. - Mild melanosis - The examination was otherwise normal on direct and retroflexion views.       Latest Ref Rng & Units 01/20/2021    9:37 AM 03/22/2014   11:40 AM  Hepatic Function  Total Protein 6.0 - 8.3 g/dL 7.3  7.0   Albumin 3.5 - 5.2 g/dL 4.3  4.1   AST 0 - 37 U/L 27  18   ALT 0 - 35 U/L 23  20   Alk Phosphatase 39 - 117 U/L 77  58   Total Bilirubin 0.2 - 1.2 mg/dL 0.7  1.10        Latest Ref Rng & Units 01/20/2021    9:37 AM 07/15/2019   12:19 AM 05/04/2017    8:12 PM  CBC  WBC 4.0 - 10.5 K/uL 5.8  6.8  6.8   Hemoglobin 12.0 - 15.0 g/dL 13.5  12.6  13.2   Hematocrit 36.0 - 46.0 % 41.6  40.8  40.6   Platelets 150.0 - 400.0 K/uL 189.0  250  227     Current Medications, Allergies, Past Medical History, Past  Surgical History, Family History and Social History were reviewed in [employer] record.   Physical Exam: General: Well developed, well nourished, no acute distress Head: Normocephalic and atraumatic Eyes: Sclerae anicteric, EOMI Ears: Normal auditory acuity Mouth: Not examined Lungs: Clear throughout to auscultation Heart: Regular rate and rhythm; no murmurs, rubs or bruits Abdomen: Soft, non tender and non distended. No masses, hepatosplenomegaly or hernias noted. Normal Bowel sounds Rectal: Not done Musculoskeletal: Symmetrical with no gross deformities  Pulses:  Normal pulses noted Extremities: No clubbing, cyanosis, edema or deformities noted Neurological: Alert oriented x 4, grossly nonfocal Psychological:  Alert and cooperative. Normal mood and affect   Constantino Starace T. Fuller Plan, MD 12/01/2021, 9:27 AM

## 2021-12-04 ENCOUNTER — Other Ambulatory Visit: Payer: Self-pay | Admitting: Family Medicine

## 2021-12-04 ENCOUNTER — Ambulatory Visit: Payer: Self-pay

## 2021-12-04 ENCOUNTER — Ambulatory Visit (HOSPITAL_COMMUNITY): Payer: 59

## 2021-12-04 DIAGNOSIS — M25561 Pain in right knee: Secondary | ICD-10-CM

## 2022-01-04 NOTE — Progress Notes (Unsigned)
[facility] [address] [place]. [place] [address] [phone]       OFFICE FOLLOW UP VISIT NOTE  Jeanette [name] Date of Birth:  05/15/1951 Medical Record Number:  [id number]   Referring MD: Precious Haws Reason for Referral: Migraine f/u   Chief complaint: No chief complaint on file.      HPI:  Update 01/04/2022 JM: Patient returns for follow-up visit after prior visit over 1 year ago.  Reports migraines *** Continues on amitriptyline 10 mg nightly, denies side effects  Memory has [name] stable, continues to maintain ADLs and IADLs independently  Denies new stroke/TIA symptoms.  Compliant on aspirin and atorvastatin.  Blood pressure and diabetes well controlled.  Closely follows with PCP.  Nightly use of CPAP for OSA management.       History provided for reference purposes only Update 12/23/2020 JM: Jeanette [name] returns for overdue 13-monthfollow-up.  Migraines: Reports 2-3 migraines per month on amitriptyline 10 mg nightly tolerating without side effects.  Migraines can last usually all day -located frontal with throbbing sensation - will take tylenol with limited benefit. Jeanette [name] has tried Depakote and venlafaxine in the past without benefit.  Jeanette [name] has not previously tried emergent relief.  Memory loss: Stable. Maintaining ADLs and IADLs independently  History of stroke: Remains on aspirin and atorvastatin for secondary stroke prevention.  Blood pressure today 144/79.  Recent A1c 6.3.  Reports ongoing use of CPAP for OSA management.   C/o imbalance which has [name] progressive the past 3 months - when standing still, feels as if Jeanette [name] is getting pulled but not in any specific direction. Can have dizziness associated at times but denies spinning type sensation.  Denies any visual changes or symptoms.  Denies symptoms previously. Does have neck pain which can cause headaches - does follow with ortho Jeanette. RNelva Bushfor neck and back issues receiving injections.  Does have hx of likely diabetic neuropathy in feet and hands. Jeanette [name] does plan on starting PT today.    Update 04/16/2020 JM: Ms. LPatmanreturns for follow-up with prior complaints of memory concerns and headaches.  Initiated amitriptyline 25 mg at prior visit but unable to tolerate therefore decreased to 10 mg nightly.  Jeanette [name] has [name] doing very well without any recent migraine or headaches and tolerating low-dose amitriptyline without difficulty.  Reports improvement of cognitive impairment and maintains ADLs and IADLs independently.  History of stroke and continues on aspirin 81 mg daily and atorvastatin 20 mg daily without side effects.  Blood pressure today 142/76.  Reports ongoing use of CPAP for OSA management followed by pulmonology.  Reports b/l cataract surgery in October with improvement of vision and routinely follows with ophthalmology.  No concerns at this time.  Update 10/09/2019 JM: Ms. LScoginreturns for follow-up regarding memory concerns and headaches.  Since prior visit, Jeanette [name] was evaluated in the ED on 07/14/2019 for intermittent episodes of right upper extremity numbness, tingling, balance issues and headache.  Headache resolved after migraine cocktail.  MRI unremarkable and was advised to follow-up outpatient.  Jeanette [name] denies reoccurring transient episodes of numbness, tingling and balance but continues to experience daily headaches.  Longstanding history of headache with use of Depakote 500 mg twice daily and at prior visit recommended increasing dosage to 1000 mg twice daily but apparently was only taking once daily.  Jeanette [name] was advised to increase to twice daily after ED admission as well as being started on venlafaxine Jeanette YKrista Bluewho was on-call provider at that time.  Jeanette [name] reports since that time, headaches have [name] daily with at time 9/10 pain located "all over my head" associated with photophobia, phonophobia but denies nausea or vomiting.  Jeanette [name] reports at times headache can be located occipital  area with a pressure type band sensation.  Jeanette [name] states Jeanette [name] attempted to take 1500 mg of Depakote spread out throughout the day but did not notice any benefit therefore currently only taking one 500 mg tablet daily in addition to venlafaxine 37.5 mg daily.  Due to continued headaches, Jeanette [name] is questioning change in medical management. Jeanette [name] has not [name] on any other prior preventative medications.  Jeanette [name] was recently found to have positive ANA and does have rheumatology evaluation on 7/1.  Blood pressure stable at 118/77 and does monitor at home and typically lower.  History of OSA with ongoing compliance of CPAP currently being followed by pulmonology.  Cognition has [name] stable without worsening.  No further concerns at this time.  Update 04/10/2019 Jeanette [name]:  Jeanette [name] returns for follow-up after last visit 4 months ago.  Jeanette [name] states her subjective memory difficulties appear more or less unchanged.  Jeanette [name] denies significant worsening of her symptoms or ability to do activities of daily living for herself.  Jeanette [name] mostly struggles with recent information and short-term memory but long-term memory appears to be preserved.  Jeanette [name] did undergo lab work for reversible causes of memory loss and 12/07/2018 and vitamin B12, TSH, RPR and homocystine were all normal.  EEG done on 01/15/2019 was normal.  MRI scan of the brain on 01/27/2019 was also normal.  Jeanette [name] has not [name] participating regularly in cognitively challenging activities.  Jeanette [name] has not [name] quite physically active either.  Jeanette [name] has heard about provision over-the-counter and would like to try it.  Jeanette [name] continues to have headaches but they are not as severe and occur about 3 or 4 times a week.  Jeanette [name] is currently on Depakote ER 500 twice daily and it seems to help her and Jeanette [name] is tolerating it well without any side effects.  Jeanette [name] has no new complaints today.  Initial visit 12/07/2018 Jeanette. Leonie [name] : Jeanette [name] is a 71 year old pleasant African-American lady who is seen today for evaluation for  memory difficulties.  History is obtained from the patient, review of electronic medical records and I personally reviewed imaging films in PACS.  Jeanette [name] has a past medical history of breast cancer 2018 status post surgery, chemotherapy and radiation now in remission, chronic headaches, hypertension, hyperlipidemia, hypothyroidism and chronic back pain.  Jeanette [name] states for the last 3 months or so Jeanette [name] is noticed some memory and cognitive difficulties.  Jeanette [name] describes that mostly her short-term memory is poor but occasionally Jeanette [name] has even some long-term memory difficulties.  Jeanette [name] has trouble remembering names and recent events.  Jeanette [name] has learned to write things down.  Jeanette [name] has not noticed any trouble in driving and has never gotten lost.  Jeanette [name] has [name] able to manage all her activities of daily living and continues to live alone.  Jeanette [name] does admit to her mood being slightly under at times but denies depression.  Jeanette [name] is never [name] on treatment for depression.  Jeanette [name] has chronic headaches and has seen me several years ago and has [name] taking Depakote ER 500 twice daily and feels her headaches are under control Jeanette [name] can only occasional headaches.  Jeanette [name] denies any stroke, seizure, significant head injury with loss of consciousness.  There is no family history of dementia or cognitive problems.  Jeanette [name] has  not had any recent brain scan or lab work for memory loss done.  Jeanette [name] has a prior history of abnormal MRI showing transient left medial temporal diffusion hyperintensity on 02/22/2015 of unclear significance which was found to have resolved on follow-up MRI in 03/30/2015.  Jeanette [name] underwent extensive work-up for stroke and seizures which was all negative.     ROS:   14 system review of systems is positive for no complaints and all other systems negative PMH:  Past Medical History:  Diagnosis Date   Anemia    Arthritis    "back" (07/18/2014)   Cancer of left breast (HCC)    Chronic back pain    Headache    Heart murmur    many years  ago   Hyperlipidemia    Hypertension    Hypothyroidism    thyroidectomy 07/18/2014   Neuromuscular disorder ([facility])    neuropathy hands and feet   Personal history of chemotherapy    Personal history of radiation therapy    Stroke ([place])    Type II diabetes mellitus ([place])     Social History:  Social History   Socioeconomic History   Marital status: Divorced    Spouse name: Not on file   Number of children: 1   Years of education: Not on file   Highest education level: Some college, no degree  Occupational History   Not on file  Tobacco Use   Smoking status: Former    Packs/day: 0.25    Years: 5.00    Total pack years: 1.25    Types: Cigarettes    Quit date: 05/24/1970    Years since quitting: 51.6    Passive exposure: Never   Smokeless tobacco: Never  Vaping Use   Vaping Use: Never used  Substance and Sexual Activity   Alcohol use: No    Comment: 07/18/2014 "quit drinking in the 1980's"   Drug use: Not Currently    Types: Marijuana    Comment: occassionally way back when   Sexual activity: Not Currently  Other Topics Concern   Not on file  Social History Narrative   Lives at home alone   Right handed   Caffeine: 1 cup coffee daily   Social Determinants of Health   Financial Resource Strain: Not on file  Food Insecurity: Not on file  Transportation Needs: Not on file  Physical Activity: Not on file  Stress: Not on file  Social Connections: Not on file  Intimate Partner Violence: Not on file    Medications:   Current Outpatient Medications on File Prior to Visit  Medication Sig Dispense Refill   ACCU-CHEK AVIVA PLUS test strip      albuterol (VENTOLIN HFA) 108 (90 Base) MCG/ACT inhaler Inhale 2 puffs into the lungs every 6 (six) hours as needed for wheezing or shortness of breath. 18 g 11   amitriptyline (ELAVIL) 10 MG tablet TAKE 1 TABLET BY MOUTH EVERYDAY AT BEDTIME 90 tablet 3   amLODipine (NORVASC) 10 MG tablet Take 10 mg by mouth daily.     aspirin EC  81 MG tablet Take 81 mg by mouth daily.     atorvastatin (LIPITOR) 20 MG tablet Take 20 mg by mouth daily.      calcium-vitamin D (OSCAL WITH D) 500-200 MG-UNIT per tablet Take 2 tablets by mouth 2 (two) times daily. 60 tablet 1   furosemide (LASIX) 20 MG tablet Take 20 mg by mouth daily.      gabapentin (NEURONTIN) 300 MG capsule  Take 300 mg by mouth 3 (three) times daily.      levothyroxine (SYNTHROID, LEVOTHROID) 88 MCG tablet Take 88 mcg by mouth daily before breakfast.     linaclotide (LINZESS) 290 MCG CAPS capsule Take 1 capsule (290 mcg total) by mouth daily before breakfast. 30 capsule 11   Multiple Vitamins-Minerals (MULTIVITAMIN WITH MINERALS) tablet Take 1 tablet by mouth daily.      olmesartan (BENICAR) 20 MG tablet Take 20 mg by mouth daily.     polyethylene glycol (MIRALAX / GLYCOLAX) 17 g packet Take 17 g by mouth daily.     Rimegepant Sulfate ([name]) 75 MG TBDP Take 75 mg by mouth as needed (For acute migraine relief. max dose 75 mg/24 hrs). 16 tablet 5   Semaglutide,0.25 or 0.'5MG'$ /DOS, 2 MG/1.5ML SOPN Inject into the skin.     Current Facility-Administered Medications on File Prior to Visit  Medication Dose Route Frequency Provider Last Rate Last Admin   dexamethasone (DECADRON) injection 2 mg  2 mg Intra-articular Once [place], Max T, DPM        Allergies:   Allergies  Allergen Reactions   Topamax [Topiramate] Other (See Comments)    CAUSED HAIR LOSS    Vitals There were no vitals filed for this visit.   There is no height or weight on file to calculate BMI.    Physical Exam General: well developed, well nourished pleasant middle-aged African-American lady, seated, in no evident distress Head: head normocephalic and atraumatic.   Neck: supple with no carotid or supraclavicular bruits Cardiovascular: regular rate and rhythm, no murmurs Musculoskeletal: no deformity Skin:  no rash/petichiae Vascular:  Normal pulses all extremities  Neurologic Exam Mental  Status: Awake and fully alert.  Fluent speech and language.  Oriented to place and time. Recent and remote memory intact. Attention span, concentration and fund of knowledge appropriate. Mood and affect appropriate.    Cranial Nerves: Pupils equal, briskly reactive to light. Extraocular movements full without nystagmus. Visual fields full to confrontation. Hearing intact. Facial sensation intact. Face, tongue, palate moves normally and symmetrically.  Motor: Normal bulk and tone. Normal strength in all tested extremity muscles. Sensory.: intact to touch , pinprick , position and vibratory sensation.  Coordination: Rapid alternating movements normal in all extremities. Finger-to-nose and heel-to-shin performed accurately bilaterally. Gait and Station: Arises from chair without difficulty. Stance is normal. Gait demonstrates normal stride length and balance without use of assistive device. Able to heel, toe and tandem walk with mild difficulty.  Romberg negative. Reflexes: 1+ and symmetric. Toes downgoing.       ASSESSMENT/PLAN: 71 year old African-American lady with PMH of HTN, HLD, DM, OSA on CPAP and prior stroke with subacute memory and mild cognitive difficulties likely due to mild cognitive impairment as well as recent worsening of chronic headaches.  Recent evaluation in ED on 07/14/2019 with transient left arm numbness/tingling, imbalance and headache with improvement of symptoms after migraine cocktail.  MRI unremarkable.  Chronic headaches likely mix of migraines without aura with some underlying tension related features which have since improved after initiating amitriptyline currently experiencing 2-3 migraines per month. Also c/o imbalance which has [name] gradually worsening over the past 2 to 3 months with chronic history of neck pain and diabetic neuropathy   -Continue amitriptyline 10 mg nightly for migraine prophylaxis -refill provided -start [name] 75 mg as needed for acute migraine  relief -samples provided and if beneficial, order will be placed -No benefit with use of Depakote or venlafaxine -Discussed avoidance of  migraine triggers and managing stressors -Advised to call with any worsening migraine headaches -Improvement of cognition - will continue to monitor -Continue to follow with PCP for secondary stroke prevention measures -imbalance likely in setting of worsening neck pain and diabetic neuropathy -neuro exam unremarkable and low suspicion for stroke etiology.  Plan to start working with PT today and advise ensure routine follow-up with orthopedics for worsening neck pain   Follow-up in 6 months or call earlier if needed  CC:  [place] provider: Dr. Barron Alvine, Dola Factor, MD    I spent 32 minutes of face-to-face and non-face-to-face time with patient.  This included previsit chart review, lab review, study review, order entry, electronic health record documentation, patient education regarding migraine headaches, ongoing use of amitriptyline and use of emergent therapy, cognitive impairment, history of stroke, imbalance complaints and likely etiology and answered all questions to patient satisfaction  Frann Rider, AGNP-BC  [facility] [address] [place], [place] [address]  Phone [phone] Fax [phone] Note: This document was prepared with digital dictation and possible smart phrase technology. Any transcriptional errors that result from this process are unintentional.

## 2022-01-05 ENCOUNTER — Ambulatory Visit (INDEPENDENT_AMBULATORY_CARE_PROVIDER_SITE_OTHER): Payer: 59 | Admitting: Adult Health

## 2022-01-05 ENCOUNTER — Encounter: Payer: Self-pay | Admitting: Adult Health

## 2022-01-05 VITALS — BP 122/70 | HR 73 | Ht 62.0 in | Wt 174.0 lb

## 2022-01-05 DIAGNOSIS — G3184 Mild cognitive impairment, so stated: Secondary | ICD-10-CM

## 2022-01-05 DIAGNOSIS — Z8673 Personal history of transient ischemic attack (TIA), and cerebral infarction without residual deficits: Secondary | ICD-10-CM

## 2022-01-05 DIAGNOSIS — G43709 Chronic migraine without aura, not intractable, without status migrainosus: Secondary | ICD-10-CM

## 2022-01-05 MED ORDER — NURTEC 75 MG PO TBDP: 75.0000 mg | ORAL_TABLET | ORAL | 11 refills | Status: DC | PRN

## 2022-01-05 NOTE — Patient Instructions (Addendum)
Your Plan:  No changes today - continue current treatment plan   Will send in a prescription for [name]    Follow up in 1 year or call earlier if needed     Thank you for coming to see [place] at [facility]. I hope we have been able to provide you high quality care today.  You may receive a patient satisfaction survey over the next few weeks. We would appreciate your feedback and comments so that we may continue to improve ourselves and the health of our patients.

## 2022-01-07 NOTE — Progress Notes (Signed)
 Patient Care Team: Boyd, Tammy Lamonica, MD as PCP - General (Family Medicine)  DIAGNOSIS:  Encounter Diagnosis  Name Primary?   Malignant neoplasm of upper-outer quadrant of left breast in female, estrogen receptor positive (HCC)     SUMMARY OF ONCOLOGIC HISTORY: Oncology History  Breast cancer of upper-outer quadrant of left female breast (HCC)  03/23/2007 Initial Diagnosis   Breast cancer of upper-outer quadrant of left female breast   05/09/2007 Surgery   Left mastectomy T2, N1, M0 stage IIB ER/PR positive HER-2 negative (based on patient's description.- no records available)   06/13/2007 - 08/02/2007 Chemotherapy   Taxotere Cytoxan x4   09/21/2007 - 11/01/2007 Radiation Therapy   Adjuvant radiation therapy   11/28/2007 - 03/22/2014 Anti-estrogen oral therapy   Arimidex 1 mg daily   07/18/2014 Surgery   total thyroidectomy: Benign multinodular goiter     CHIEF COMPLIANT: Follow-up of left breast cancer    INTERVAL HISTORY: Jeanette [name] is a 70 y.o. with above-mentioned history of left breast cancer treated with mastectomy, adjuvant chemotherapy, radiation, and 6 years of antiestrogen therapy with anastrozole. She is currently on surveillance She presents to the clinic today for a follow-up. She states about 2 weeks ago she notice severe pain on the left side pain does come and goes. She said doesn't nothing makes it or worst. The pain last at least 2-3 seconds it radiates to the back of her arm.  ALLERGIES:  is allergic to topamax [topiramate].  MEDICATIONS:  Current Outpatient Medications  Medication Sig Dispense Refill   ACCU-CHEK AVIVA PLUS test strip      albuterol (VENTOLIN HFA) 108 (90 Base) MCG/ACT inhaler Inhale 2 puffs into the lungs every 6 (six) hours as needed for wheezing or shortness of breath. 18 g 11   amitriptyline (ELAVIL) 10 MG tablet TAKE 1 TABLET BY MOUTH EVERYDAY AT BEDTIME 90 tablet 3   amLODipine (NORVASC) 10 MG tablet Take 10 mg by mouth  daily.     aspirin EC 81 MG tablet Take 81 mg by mouth daily.     atorvastatin (LIPITOR) 20 MG tablet Take 20 mg by mouth daily.      calcium-vitamin D (OSCAL WITH D) 500-200 MG-UNIT per tablet Take 2 tablets by mouth 2 (two) times daily. 60 tablet 1   furosemide (LASIX) 20 MG tablet Take 20 mg by mouth daily.      gabapentin (NEURONTIN) 300 MG capsule Take 300 mg by mouth 3 (three) times daily.      levothyroxine (SYNTHROID, LEVOTHROID) 88 MCG tablet Take 88 mcg by mouth daily before breakfast.     linaclotide (LINZESS) 290 MCG CAPS capsule Take 1 capsule (290 mcg total) by mouth daily before breakfast. 30 capsule 11   Multiple Vitamins-Minerals (MULTIVITAMIN WITH MINERALS) tablet Take 1 tablet by mouth daily.      olmesartan (BENICAR) 20 MG tablet Take 20 mg by mouth daily.     polyethylene glycol (MIRALAX / GLYCOLAX) 17 g packet Take 17 g by mouth daily.     Rimegepant Sulfate ([name]) 75 MG TBDP Take 75 mg by mouth as needed (For acute migraine relief. max dose 75 mg/24 hrs). 16 tablet 11   Semaglutide,0.25 or 0.5MG/DOS, (OZEMPIC, 0.25 OR 0.5 MG/DOSE,) 2 MG/3ML SOPN Inject into the skin.     Semaglutide,0.25 or 0.5MG/DOS, 2 MG/1.5ML SOPN Inject into the skin.     TYRVAYA 0.03 MG/ACT SOLN SMARTSIG:1 Spray(s) Both Nares     Current Facility-Administered Medications  Medication Dose Route   Frequency Provider Last Rate Last Admin   dexamethasone (DECADRON) injection 2 mg  2 mg Intra-articular Once [place], Max T, DPM        PHYSICAL EXAMINATION: ECOG PERFORMANCE STATUS: 1 - Symptomatic but completely ambulatory  Vitals:   01/11/22 1338  BP: 114/69  Pulse: (!) 56  Resp: 18  Temp: (!) 97.3 F (36.3 C)  SpO2: 100%   Filed Weights   01/11/22 1338  Weight: 171 lb 4.8 oz (77.7 kg)    BREAST: Left mastectomy chest wall no palpable lumps or nodules no tenderness to palpation.  Right breast is without any lumps or nodules.  (exam performed in the presence of a chaperone)  LABORATORY DATA:   I have reviewed the data as listed    Latest Ref Rng & Units 01/20/2021    9:37 AM 07/15/2019   12:19 AM 05/04/2017    8:12 PM  CMP  Glucose 70 - 99 mg/dL 105  73  127   BUN 6 - 23 mg/dL _0 Creatinine 0.40 - 1.20 mg/dL 1.25  1.63  1.19   Sodium 135 - 145 mEq/L 142  141  138   Potassium 3.5 - 5.1 mEq/L 4.1  4.5  3.7   Chloride 96 - 112 mEq/L 105  107  104   CO2 19 - 32 mEq/L _1 Calcium 8.4 - 10.5 mg/dL 9.9  8.8  9.3   Total Protein 6.0 - 8.3 g/dL 7.3     Total Bilirubin 0.2 - 1.2 mg/dL 0.7     Alkaline Phos 39 - 117 U/L 77     AST 0 - 37 U/L 27     ALT 0 - 35 U/L 23       Lab Results  Component Value Date   WBC 5.8 01/20/2021   HGB 13.5 01/20/2021   HCT 41.6 01/20/2021   MCV 83.8 01/20/2021   PLT 189.0 01/20/2021   NEUTROABS 3.2 01/20/2021    ASSESSMENT & PLAN:  Breast cancer of upper-outer quadrant of left female breast Left breast cancer status post left mastectomy December 2000 and T2, N1, M0 stage IIB ER/PR positive HER-2 negative followed by adjuvant chemotherapy and radiation started Arimidex July 2009 and completed 03/22/2014   Breast Cancer Surveillance: 1. Breast exam : 01/11/2022: Benign 2. Mammogram right breast: 05/14/2021: Benign breast density category B. 3.  CT abdomen and pelvis: 01/27/2021: Constipation, pelvic floor laxity with rectocele    Left chest wall pain: Intermittent sharp jabs of pain over the past 2 weeks.  No aggravating or relieving factors.  She has not taken any medications for this.  No palpable lumps or nodules.  I believe these are pain related to the nerve endings being very sensitive.  Encouraged her to use moisturizers    Follow-up once a year for fo on an as-needed basis.    No orders of the defined types were placed in this encounter.  The patient has a good understanding of the overall plan. she agrees with it. she will call with any problems that may develop before the next visit here. Total time spent: 30  mins including face to face time and time spent for planning, charting and co-ordination of care   Harriette Ohara, MD 01/11/22    I Gardiner Coins am scribing for Dr. Lindi Adie  I have reviewed the above documentation for accuracy and completeness, and I agree with the above.

## 2022-01-11 ENCOUNTER — Inpatient Hospital Stay: Payer: 59 | Attending: Hematology and Oncology | Admitting: Hematology and Oncology

## 2022-01-11 ENCOUNTER — Other Ambulatory Visit: Payer: Self-pay

## 2022-01-11 DIAGNOSIS — Z9012 Acquired absence of left breast and nipple: Secondary | ICD-10-CM | POA: Diagnosis not present

## 2022-01-11 DIAGNOSIS — R0789 Other chest pain: Secondary | ICD-10-CM | POA: Insufficient documentation

## 2022-01-11 DIAGNOSIS — Z170 Estrogen receptor positive status [ER+]: Secondary | ICD-10-CM | POA: Insufficient documentation

## 2022-01-11 DIAGNOSIS — C50412 Malignant neoplasm of upper-outer quadrant of left female breast: Secondary | ICD-10-CM | POA: Diagnosis present

## 2022-01-11 DIAGNOSIS — Z79811 Long term (current) use of aromatase inhibitors: Secondary | ICD-10-CM | POA: Insufficient documentation

## 2022-01-11 NOTE — Assessment & Plan Note (Addendum)
Left breast cancer status post left mastectomy December 2000 and T2, N1, M0 stage IIB ER/PR positive HER-2 negative followed by adjuvant chemotherapy and radiation started Arimidex July 2009 and completed 03/22/2014  Breast Cancer Surveillance: 1. Breast exam : 01/11/2022: Benign 2. Mammogramright breast: 05/14/2021: Benign breast density category B. 3.  CT abdomen and pelvis: 01/27/2021: Constipation, pelvic floor laxity with rectocele  Left chest wall pain: Intermittent sharp jabs of pain over the past 2 weeks.  No aggravating or relieving factors.  She has not taken any medications for this.  No palpable lumps or nodules.  I believe these are pain related to the nerve endings being very sensitive.  Encouraged her to use moisturizers    Follow-up once a yearfor fo on an as-needed basis.

## 2022-01-13 ENCOUNTER — Ambulatory Visit: Payer: Medicare Other | Admitting: Hematology and Oncology

## 2022-01-19 ENCOUNTER — Ambulatory Visit: Payer: Medicare Other | Admitting: Hematology and Oncology

## 2022-01-26 ENCOUNTER — Telehealth: Payer: Self-pay | Admitting: Podiatry

## 2022-01-26 NOTE — Telephone Encounter (Signed)
Checking the status of diabetic shoes. Pt was seen for fitting in July 2023. Pt would like a callback today if possible.  Please advise

## 2022-01-28 ENCOUNTER — Telehealth: Payer: Self-pay | Admitting: Podiatry

## 2022-01-28 NOTE — Telephone Encounter (Signed)
Pt called again as she has not heard back about status of her diabetic shoes.  Upon checking safestep we did get documents but not until 8.23 and the shoes have been ordered and should be coming in soon and some one will call pt when they come in. Pt said thank you.

## 2022-02-10 ENCOUNTER — Telehealth: Payer: Self-pay | Admitting: Podiatry

## 2022-02-10 NOTE — Telephone Encounter (Signed)
Pt is calling back to check the status of her diabetic shoes.   Please advise

## 2022-02-10 NOTE — Telephone Encounter (Signed)
Jeanette [name]

## 2022-02-11 NOTE — Telephone Encounter (Signed)
error 

## 2022-02-18 NOTE — Telephone Encounter (Signed)
Shoes arrived 02/16/22. Patient will be called to schedule shoe pick-up.

## 2022-02-23 ENCOUNTER — Ambulatory Visit (INDEPENDENT_AMBULATORY_CARE_PROVIDER_SITE_OTHER): Payer: Medicare Other | Admitting: *Deleted

## 2022-02-23 DIAGNOSIS — M2012 Hallux valgus (acquired), left foot: Secondary | ICD-10-CM

## 2022-02-23 DIAGNOSIS — E1149 Type 2 diabetes mellitus with other diabetic neurological complication: Secondary | ICD-10-CM | POA: Diagnosis not present

## 2022-02-23 DIAGNOSIS — E1140 Type 2 diabetes mellitus with diabetic neuropathy, unspecified: Secondary | ICD-10-CM

## 2022-02-23 NOTE — Progress Notes (Signed)
Patient presents today to pick up diabetic shoes and insoles.  Patient was dispensed 1 pair of diabetic shoes and 3 pairs of foam casted diabetic insoles. Fit was satisfactory. Instructions for break-in and wear was reviewed and a copy was given to the patient.   Re-appointment for regularly scheduled diabetic foot care visits or if they should experience any trouble with the shoes or insoles.  

## 2022-02-24 ENCOUNTER — Ambulatory Visit: Payer: Medicare Other

## 2022-03-05 ENCOUNTER — Ambulatory Visit: Payer: Medicare Other | Admitting: *Deleted

## 2022-03-05 DIAGNOSIS — E1140 Type 2 diabetes mellitus with diabetic neuropathy, unspecified: Secondary | ICD-10-CM

## 2022-03-05 DIAGNOSIS — M2012 Hallux valgus (acquired), left foot: Secondary | ICD-10-CM

## 2022-03-05 NOTE — Progress Notes (Cosign Needed)
Patient presents today to discuss diabetic shoes and insoles.  She states the shoes felt too big on her heel.    We will send back: [id number] Apex Walker  Reorder: [place] were kept.  Patient will be contacted for a fitting appointment once the reordered shoes arrive in office.

## 2022-03-11 ENCOUNTER — Ambulatory Visit (INDEPENDENT_AMBULATORY_CARE_PROVIDER_SITE_OTHER): Payer: Medicare Other

## 2022-03-11 ENCOUNTER — Other Ambulatory Visit: Payer: Medicare Other

## 2022-03-11 ENCOUNTER — Ambulatory Visit (INDEPENDENT_AMBULATORY_CARE_PROVIDER_SITE_OTHER): Payer: Medicare Other | Admitting: Podiatry

## 2022-03-11 ENCOUNTER — Encounter: Payer: Self-pay | Admitting: Podiatry

## 2022-03-11 DIAGNOSIS — M7662 Achilles tendinitis, left leg: Secondary | ICD-10-CM

## 2022-03-11 DIAGNOSIS — M722 Plantar fascial fibromatosis: Secondary | ICD-10-CM | POA: Diagnosis not present

## 2022-03-11 MED ORDER — TRIAMCINOLONE ACETONIDE 10 MG/ML IJ SUSP
10.0000 mg | Freq: Once | INTRAMUSCULAR | Status: AC
  Administered 2022-03-11: 10 mg

## 2022-03-12 NOTE — Progress Notes (Signed)
Subjective:   Patient ID: Jeanette [name], female   DOB: 71 y.o.   MRN: [id number]   HPI Patient states she is developed a lot of pain in her left arch and its been going on for the past month.  Feels like her foot is also flattened somewhat and she has been trying to walk more.   ROS      Objective:  Physical Exam  Neurovascular status was found to be intact with inflammation around the mid arch area left with fluid buildup pain with pressure with moderate depression of the arch     Assessment:  Acute plantar fasciitis left mid arch area with pain     Plan:  H&P reviewed condition and x-ray went ahead today did sterile prep injected the mid arch left 3 mg Kenalog 5 mg Xylocaine at insertion applied sterile dressing and then dispensed fascial brace that was fitted properly to her foot to lift the arch up and take pressure off the plantar arch.  Patient will be seen back as needed  X-rays indicate that there is moderate depression of the arch no indications of advanced deformity or fracture

## 2022-03-24 ENCOUNTER — Ambulatory Visit: Payer: Medicare Other

## 2022-03-24 DIAGNOSIS — E1149 Type 2 diabetes mellitus with other diabetic neurological complication: Secondary | ICD-10-CM | POA: Diagnosis not present

## 2022-03-24 DIAGNOSIS — E1140 Type 2 diabetes mellitus with diabetic neuropathy, unspecified: Secondary | ICD-10-CM

## 2022-03-24 NOTE — Progress Notes (Signed)
Patient presents today to pick up diabetic shoes and insoles.  Patient was dispensed 1 pair of diabetic shoes and 3 pairs of foam casted diabetic insoles. Fit was satisfactory. Instructions for break-in and wear was reviewed and a copy was given to the patient.   Re-appointment for regularly scheduled diabetic foot care visits or if they should experience any trouble with the shoes or insoles.  

## 2022-04-05 ENCOUNTER — Other Ambulatory Visit: Payer: Self-pay | Admitting: Family Medicine

## 2022-04-05 DIAGNOSIS — Z1231 Encounter for screening mammogram for malignant neoplasm of breast: Secondary | ICD-10-CM

## 2022-04-21 ENCOUNTER — Ambulatory Visit (INDEPENDENT_AMBULATORY_CARE_PROVIDER_SITE_OTHER): Payer: Medicare Other | Admitting: Podiatry

## 2022-04-21 ENCOUNTER — Ambulatory Visit: Payer: Medicare Other | Admitting: Podiatry

## 2022-04-21 ENCOUNTER — Encounter: Payer: Self-pay | Admitting: Podiatry

## 2022-04-21 DIAGNOSIS — M722 Plantar fascial fibromatosis: Secondary | ICD-10-CM | POA: Diagnosis not present

## 2022-04-21 MED ORDER — TRIAMCINOLONE ACETONIDE 10 MG/ML IJ SUSP
10.0000 mg | Freq: Once | INTRAMUSCULAR | Status: AC
  Administered 2022-04-21: 10 mg

## 2022-04-22 NOTE — Progress Notes (Signed)
Subjective:   Patient ID: Jeanette [name], female   DOB: 71 y.o.   MRN: [id number]   HPI Patient states she has been doing pretty well but she does have pain still in her plantar fascia not as bad as it was previously but present   ROS      Objective:  Physical Exam  Pain in the plantar fascia at insertion calcaneus improved from previous but quite sore     Assessment:  Acute [place] fasciitis left     Plan:  Sterile prep injected the insertion of the fascia 3 mg Kenalog 5 mg Xylocaine and advised on stretching exercises anti-inflammatories.  Reappoint to recheck

## 2022-06-04 ENCOUNTER — Other Ambulatory Visit: Payer: Self-pay | Admitting: Family Medicine

## 2022-06-04 ENCOUNTER — Ambulatory Visit
Admission: RE | Admit: 2022-06-04 | Discharge: 2022-06-04 | Disposition: A | Discharge status: Home / Self Care | Payer: Medicare Other | Source: Ambulatory Visit | Attending: Family Medicine | Admitting: Family Medicine

## 2022-06-04 DIAGNOSIS — Z1231 Encounter for screening mammogram for malignant neoplasm of breast: Secondary | ICD-10-CM

## 2022-09-14 ENCOUNTER — Ambulatory Visit (INDEPENDENT_AMBULATORY_CARE_PROVIDER_SITE_OTHER): Payer: 59

## 2022-09-14 DIAGNOSIS — E1140 Type 2 diabetes mellitus with diabetic neuropathy, unspecified: Secondary | ICD-10-CM

## 2022-09-14 DIAGNOSIS — M21612 Bunion of left foot: Secondary | ICD-10-CM

## 2022-09-14 DIAGNOSIS — E1149 Type 2 diabetes mellitus with other diabetic neurological complication: Secondary | ICD-10-CM

## 2022-09-14 DIAGNOSIS — M7662 Achilles tendinitis, left leg: Secondary | ICD-10-CM

## 2022-09-14 DIAGNOSIS — M2012 Hallux valgus (acquired), left foot: Secondary | ICD-10-CM

## 2022-09-14 NOTE — Progress Notes (Signed)
Patient presents to the office today for diabetic shoe and insole measuring.  ABN signed.   Documentation of medical necessity will be sent to patient's treating diabetic doctor to verify and sign.   Patient's diabetic provider: Verlon Au, MD   Shoes and insoles will be ordered at that time and patient will be notified for an appointment for fitting when they arrive.   Patient shoe selection-   1st   Shoe choice:   [id number]  Shoe size ordered: 9W

## 2022-11-08 ENCOUNTER — Other Ambulatory Visit: Payer: 59

## 2022-12-02 ENCOUNTER — Other Ambulatory Visit: Payer: 59

## 2022-12-09 ENCOUNTER — Ambulatory Visit (INDEPENDENT_AMBULATORY_CARE_PROVIDER_SITE_OTHER): Payer: 59

## 2022-12-09 DIAGNOSIS — E1140 Type 2 diabetes mellitus with diabetic neuropathy, unspecified: Secondary | ICD-10-CM

## 2022-12-09 DIAGNOSIS — M2012 Hallux valgus (acquired), left foot: Secondary | ICD-10-CM

## 2022-12-09 DIAGNOSIS — M722 Plantar fascial fibromatosis: Secondary | ICD-10-CM | POA: Diagnosis not present

## 2022-12-09 DIAGNOSIS — E1149 Type 2 diabetes mellitus with other diabetic neurological complication: Secondary | ICD-10-CM

## 2022-12-09 NOTE — Progress Notes (Signed)

## 2022-12-09 NOTE — Addendum Note (Signed)
Addended by: Delories Heinz on: 12/09/2022 09:31 AM   Modules accepted: Level of Service

## 2022-12-25 ENCOUNTER — Other Ambulatory Visit: Payer: Self-pay | Admitting: Adult Health

## 2023-01-11 ENCOUNTER — Ambulatory Visit: Payer: 59 | Admitting: Adult Health

## 2023-03-09 ENCOUNTER — Telehealth: Payer: Self-pay

## 2023-03-09 NOTE — Telephone Encounter (Signed)
*  [facility]  Prior Authorization form/request asks a question that requires your assistance. Please see the question below and advise accordingly.   Is patient still being seen by the clinic? Last visit was 1+ year ago. Received PA request for [name].   CMM Key: [id number]

## 2023-03-10 NOTE — Telephone Encounter (Signed)
Your information has been submitted to [employer]. [employer] will review the request and will issue a decision, typically within 1-3 days from your submission. Awaiting determination

## 2023-03-10 NOTE — Telephone Encounter (Signed)
Prior Authorization form/request asks a question that requires your assistance. Please see the question below and advise accordingly.    2. Has the patient experienced an inadequate treatment response, intolerance, or does the patient have a contraindication to one triptan 5-HT1 receptor agonist?

## 2023-03-10 NOTE — Telephone Encounter (Signed)
Approved today by [facility]  Your request has been approved Authorization Expiration Date: 03/09/2024

## 2023-05-02 NOTE — Progress Notes (Unsigned)
[facility] [address] [place]. [place] [address] [phone]       OFFICE FOLLOW UP VISIT NOTE  Jeanette [name] Date of Birth:  25-Dec-1950 Medical Record Number:  [id number]   Referring MD: Virl Son Reason for Referral: Migraine f/u   Chief complaint: No chief complaint on file.      HPI:  Update 05/03/2023 JM: Patient returns for follow-up visit after prior visit over 1 year ago.  Reports she has been doing well since prior visit.  Reports migraines stable on amitriptyline, *** migraines per month. Use of [name] with benefit.  Cognition ***.   Denies new stroke/TIA symptoms.  Remains on stroke prevention medications and routinely follows with PCP for stroke risk factor management.        History provided for reference purposes only Update 01/04/2022 JM: Patient returns for follow-up visit after prior visit over 1 year ago unaccompanied  Reports migraines have been stable since prior visit on amitriptyline 10 mg nightly. Reports about 3 migraine days per month. Will use [name] with resolution and denies side effects.   Memory has been stable, continues to maintain ADLs and IADLs independently  Denies new stroke/TIA symptoms.  Compliant on aspirin and atorvastatin.  Blood pressure and diabetes well controlled.  Closely follows with PCP.  Nightly use of CPAP for OSA management.  Update 12/23/2020 JM: Jeanette [name] returns for overdue 53-month follow-up.  Migraines: Reports 2-3 migraines per month on amitriptyline 10 mg nightly tolerating without side effects.  Migraines can last usually all day -located frontal with throbbing sensation - will take tylenol with limited benefit. She has tried Depakote and venlafaxine in the past without benefit.  She has not previously tried emergent relief.  Memory loss: Stable. Maintaining ADLs and IADLs independently  History of stroke: Remains on aspirin and atorvastatin for secondary stroke prevention.   Blood pressure today 144/79.  Recent A1c 6.3.  Reports ongoing use of CPAP for OSA management.   C/o imbalance which has been progressive the past 3 months - when standing still, feels as if she is getting pulled but not in any specific direction. Can have dizziness associated at times but denies spinning type sensation.  Denies any visual changes or symptoms.  Denies symptoms previously. Does have neck pain which can cause headaches - does follow with ortho Dr. Ethelene Hal for neck and back issues receiving injections. Does have hx of likely diabetic neuropathy in feet and hands. She does plan on starting PT today.    Update 04/16/2020 JM: Jeanette [name] returns for follow-up with prior complaints of memory concerns and headaches.  Initiated amitriptyline 25 mg at prior visit but unable to tolerate therefore decreased to 10 mg nightly.  She has been doing very well without any recent migraine or headaches and tolerating low-dose amitriptyline without difficulty.  Reports improvement of cognitive impairment and maintains ADLs and IADLs independently.  History of stroke and continues on aspirin 81 mg daily and atorvastatin 20 mg daily without side effects.  Blood pressure today 142/76.  Reports ongoing use of CPAP for OSA management followed by pulmonology.  Reports b/l cataract surgery in October with improvement of vision and routinely follows with ophthalmology.  No concerns at this time.  Update 10/09/2019 JM: Jeanette [name] returns for follow-up regarding memory concerns and headaches.  Since prior visit, she was evaluated in the ED on 07/14/2019 for intermittent episodes of right upper extremity numbness, tingling, balance issues and headache.  Headache resolved after migraine cocktail.  MRI unremarkable and was advised to follow-up outpatient.  She denies reoccurring transient episodes of numbness, tingling and balance but continues to experience daily headaches.  Longstanding history of headache with use of  Depakote 500 mg twice daily and at prior visit recommended increasing dosage to 1000 mg twice daily but apparently was only taking once daily.  She was advised to increase to twice daily after ED admission as well as being started on venlafaxine Dr Terrace Arabia who was on-call provider at that time.  She reports since that time, headaches have been daily with at time 9/10 pain located "all over my head" associated with photophobia, phonophobia but denies nausea or vomiting.  She reports at times headache can be located occipital area with a pressure type band sensation.  She states she attempted to take 1500 mg of Depakote spread out throughout the day but did not notice any benefit therefore currently only taking one 500 mg tablet daily in addition to venlafaxine 37.5 mg daily.  Due to continued headaches, she is questioning change in medical management. She has not been on any other prior preventative medications.  She was recently found to have positive ANA and does have rheumatology evaluation on 7/1.  Blood pressure stable at 118/77 and does monitor at home and typically lower.  History of OSA with ongoing compliance of CPAP currently being followed by pulmonology.  Cognition has been stable without worsening.  No further concerns at this time.  Update 04/10/2019 Dr Pearlean Brownie:  She returns for follow-up after last visit 4 months ago.  She states her subjective memory difficulties appear more or less unchanged.  She denies significant worsening of her symptoms or ability to do activities of daily living for herself.  She mostly struggles with recent information and short-term memory but long-term memory appears to be preserved.  She did undergo lab work for reversible causes of memory loss and 12/07/2018 and vitamin B12, TSH, RPR and homocystine were all normal.  EEG done on 01/15/2019 was normal.  MRI scan of the brain on 01/27/2019 was also normal.  She has not been participating regularly in cognitively challenging  activities.  She has not been quite physically active either.  She has heard about provision over-the-counter and would like to try it.  She continues to have headaches but they are not as severe and occur about 3 or 4 times a week.  She is currently on Depakote ER 500 twice daily and it seems to help her and she is tolerating it well without any side effects.  She has no new complaints today.  Initial visit 12/07/2018 Dr. Pearlean Brownie : Jeanette [name] is a 72 year old pleasant African-American lady who is seen today for evaluation for memory difficulties.  History is obtained from the patient, review of electronic medical records and I personally reviewed imaging films in PACS.  She has a past medical history of breast cancer 2018 status post surgery, chemotherapy and radiation now in remission, chronic headaches, hypertension, hyperlipidemia, hypothyroidism and chronic back pain.  She states for the last 3 months or so she is noticed some memory and cognitive difficulties.  She describes that mostly her short-term memory is poor but occasionally she has even some long-term memory difficulties.  She has trouble remembering names and recent events.  She has learned to write things down.  She has not noticed any trouble in driving and has never gotten lost.  She has been able to manage all her activities of daily living and continues to  live alone.  She does admit to her mood being slightly under at times but denies depression.  She is never been on treatment for depression.  She has chronic headaches and has seen me several years ago and has been taking Depakote ER 500 twice daily and feels her headaches are under control she can only occasional headaches.  She denies any stroke, seizure, significant head injury with loss of consciousness.  There is no family history of dementia or cognitive problems.  She has not had any recent brain scan or lab work for memory loss done.  She has a prior history of abnormal MRI showing  transient left medial temporal diffusion hyperintensity on 02/22/2015 of unclear significance which was found to have resolved on follow-up MRI in 03/30/2015.  She underwent extensive work-up for stroke and seizures which was all negative.     ROS:   14 system review of systems is positive for no complaints and all other systems negative PMH:  Past Medical History:  Diagnosis Date   Anemia    Arthritis    "back" (07/18/2014)   Cancer of left breast (HCC)    Chronic back pain    Headache    Heart murmur    many years ago   Hyperlipidemia    Hypertension    Hypothyroidism    thyroidectomy 07/18/2014   Neuromuscular disorder (HCC)    neuropathy hands and feet   Personal history of chemotherapy    Personal history of radiation therapy    Stroke (HCC)    Type II diabetes mellitus (HCC)     Social History:  Social History   Socioeconomic History   Marital status: Divorced    Spouse name: Not on file   Number of children: 1   Years of education: Not on file   Highest education level: Some college, no degree  Occupational History   Not on file  Tobacco Use   Smoking status: Former    Current packs/day: 0.00    Average packs/day: 0.3 packs/day for 5.0 years (1.3 ttl pk-yrs)    Types: Cigarettes    Start date: 05/24/1965    Quit date: 05/24/1970    Years since quitting: 52.9    Passive exposure: Never   Smokeless tobacco: Never  Vaping Use   Vaping status: Never Used  Substance and Sexual Activity   Alcohol use: No    Comment: 07/18/2014 "quit drinking in the 1980's"   Drug use: Not Currently    Types: Marijuana    Comment: occassionally way back when   Sexual activity: Not Currently  Other Topics Concern   Not on file  Social History Narrative   Lives at home alone   Right handed   Caffeine: 1 cup coffee daily   Social Determinants of Health   Financial Resource Strain: Not on file  Food Insecurity: Low Risk  (04/05/2023)   Received from [facility]   Hunger  Vital Sign    Worried About Running Out of Food in the Last Year: Never true    Ran Out of Food in the Last Year: Never true  Transportation Needs: No Transportation Needs (04/05/2023)   Received from [facility]    In the past 12 months, has lack of reliable transportation kept you from medical appointments, meetings, work or from getting things needed for daily living? : No  Physical Activity: Not on file  Stress: Not on file  Social Connections: Not on file  Intimate Partner  Violence: Not on file    Medications:   Current Outpatient Medications on File Prior to Visit  Medication Sig Dispense Refill   ACCU-CHEK AVIVA PLUS test strip      albuterol (VENTOLIN HFA) 108 (90 Base) MCG/ACT inhaler Inhale 2 puffs into the lungs every 6 (six) hours as needed for wheezing or shortness of breath. 18 g 11   amitriptyline (ELAVIL) 10 MG tablet TAKE 1 TABLET BY MOUTH EVERYDAY AT BEDTIME 90 tablet 3   amLODipine (NORVASC) 10 MG tablet Take 10 mg by mouth daily.     aspirin EC 81 MG tablet Take 81 mg by mouth daily.     atorvastatin (LIPITOR) 20 MG tablet Take 20 mg by mouth daily.      calcium-vitamin D (OSCAL WITH D) 500-200 MG-UNIT per tablet Take 2 tablets by mouth 2 (two) times daily. 60 tablet 1   furosemide (LASIX) 20 MG tablet Take 20 mg by mouth daily.      gabapentin (NEURONTIN) 300 MG capsule Take 300 mg by mouth 3 (three) times daily.      levothyroxine (SYNTHROID, LEVOTHROID) 88 MCG tablet Take 88 mcg by mouth daily before breakfast.     linaclotide (LINZESS) 290 MCG CAPS capsule Take 1 capsule (290 mcg total) by mouth daily before breakfast. 30 capsule 11   Multiple Vitamins-Minerals (MULTIVITAMIN WITH MINERALS) tablet Take 1 tablet by mouth daily.      [name] 75 MG TBDP TAKE 1 TABLET (75MG ) BY MOUTH AS NEEDED (FOR ACUTE MIGRAINE RELIEF) MAX DOSE 75MG /24H 160 tablet 0   olmesartan (BENICAR) 20 MG tablet Take 20 mg by mouth daily.     polyethylene glycol (MIRALAX /  GLYCOLAX) 17 g packet Take 17 g by mouth daily.     Semaglutide,0.25 or 0.5MG /DOS, 2 MG/1.5ML SOPN Inject into the skin.     TYRVAYA 0.03 MG/ACT SOLN SMARTSIG:1 Spray(s) Both Nares     Current Facility-Administered Medications on File Prior to Visit  Medication Dose Route Frequency Provider Last Rate Last Admin   dexamethasone (DECADRON) injection 2 mg  2 mg Intra-articular Once Bolivar, Max T, DPM        Allergies:   Allergies  Allergen Reactions   Topamax [Topiramate] Other (See Comments)    CAUSED HAIR LOSS    Vitals There were no vitals filed for this visit.  There is no height or weight on file to calculate BMI.    Physical Exam General: well developed, well nourished very pleasant middle-aged African-American lady, seated, in no evident distress Head: head normocephalic and atraumatic.   Neck: supple with no carotid or supraclavicular bruits Cardiovascular: regular rate and rhythm, no murmurs Musculoskeletal: no deformity Skin:  no rash/petichiae Vascular:  Normal pulses all extremities  Neurologic Exam Mental Status: Awake and fully alert.  Fluent speech and language.  Oriented to place and time. Recent and remote memory intact. Attention span, concentration and fund of knowledge appropriate. Mood and affect appropriate.    Cranial Nerves: Pupils equal, briskly reactive to light. Extraocular movements full without nystagmus. Visual fields full to confrontation. Hearing intact. Facial sensation intact. Face, tongue, palate moves normally and symmetrically.  Motor: Normal bulk and tone. Normal strength in all tested extremity muscles. Sensory.: intact to touch , pinprick , position and vibratory sensation.  Coordination: Rapid alternating movements normal in all extremities. Finger-to-nose and heel-to-shin performed accurately bilaterally. Gait and Station: Arises from chair without difficulty. Stance is normal. Gait demonstrates normal stride length and balance without use of  assistive device. Able to heel, toe and tandem walk with mild difficulty.  Romberg negative. Reflexes: 1+ and symmetric. Toes downgoing.       ASSESSMENT/PLAN: 72 year old African-American lady with PMH of HTN, HLD, DM, OSA on CPAP and prior stroke with subacute memory and mild cognitive difficulties likely due to mild cognitive impairment as well as chronic migraine headaches.  Evaluation in ED on 07/14/2019 with transient left arm numbness/tingling, imbalance and headache with improvement of symptoms after migraine cocktail.  MRI unremarkable.  Chronic headaches likely mix of migraines without aura with some underlying tension related features which have since improved on amitriptyline currently experiencing 3 migraines per month.     -Continue amitriptyline 10 mg nightly for migraine prophylaxis -refill up to date -Continue [name] 75 mg as needed for acute migraine relief - refill provided  -No benefit with use of Depakote or venlafaxine -Discussed avoidance of migraine triggers and managing stressors -Advised to call with any worsening migraine headaches -Continue to follow with PCP for secondary stroke prevention measures including BP goal<130/90, HLD with LDL goal<70 and DM with A1c.<7      Follow-up in 1 year or call earlier if needed    CC:  Verlon Au, MD    I spent 26 minutes of face-to-face and non-face-to-face time with patient.  This included previsit chart review, lab review, study review, order entry, electronic health record documentation, patient education regarding migraine headaches, ongoing use of amitriptyline and use of emergent therapy, cognitive impairment, history of stroke,  and answered all questions to patient satisfaction  Ihor Austin, AGNP-BC  [facility] [address] [place], [place] [address]  Phone [phone] Fax [phone] Note: This document was prepared with digital dictation and possible smart  phrase technology. Any transcriptional errors that result from this process are unintentional.

## 2023-05-03 ENCOUNTER — Ambulatory Visit: Payer: 59 | Admitting: Adult Health

## 2023-05-10 ENCOUNTER — Other Ambulatory Visit: Payer: Self-pay | Admitting: Family Medicine

## 2023-05-10 DIAGNOSIS — Z1231 Encounter for screening mammogram for malignant neoplasm of breast: Secondary | ICD-10-CM

## 2023-05-20 ENCOUNTER — Telehealth: Payer: Self-pay | Admitting: Adult Health

## 2023-05-20 MED ORDER — NURTEC 75 MG PO TBDP: 75.0000 mg | ORAL_TABLET | ORAL | 5 refills | Status: DC | PRN

## 2023-05-20 NOTE — Telephone Encounter (Signed)
Pt is requesting a refill for [name] 75 MG TBDP .  Pharmacy: [facility] [phone]

## 2023-05-20 NOTE — Telephone Encounter (Signed)
Refill sent for the patient to the pharmacy requested. The last ov in dec was cancelled by our office due to the pt being out. Pt must keep future appt for further refills

## 2023-06-09 ENCOUNTER — Ambulatory Visit: Payer: No Typology Code available for payment source

## 2023-06-23 ENCOUNTER — Ambulatory Visit
Admission: RE | Admit: 2023-06-23 | Discharge: 2023-06-23 | Disposition: A | Discharge status: Home / Self Care | Payer: No Typology Code available for payment source | Source: Ambulatory Visit | Attending: Family Medicine | Admitting: Family Medicine

## 2023-06-23 DIAGNOSIS — Z1231 Encounter for screening mammogram for malignant neoplasm of breast: Secondary | ICD-10-CM

## 2023-07-26 ENCOUNTER — Telehealth: Payer: Self-pay | Admitting: Adult Health

## 2023-07-26 NOTE — Telephone Encounter (Signed)
 Pt said insurance company does not cover Rimegepant Sulfate ([name]) 75 MG TBDP. My insurance agent gave me a list of alternative medications. Would like a call from the nurse to discuss prescription Naratridptan

## 2023-07-28 NOTE — Telephone Encounter (Signed)
 Pt is on wait list, there is currently nothing sooner available.

## 2023-07-28 NOTE — Telephone Encounter (Signed)
 Last office visit was 12/2021- need updated notes to submit to insurance

## 2023-08-02 ENCOUNTER — Telehealth: Payer: Self-pay | Admitting: Adult Health

## 2023-08-02 NOTE — Telephone Encounter (Signed)
 Pt has not been seen since 12/2021 in the office. She will need to be seen before can medication changes. I see she has a upcoming apt scheduled for 4/1. Also the Qulipta medication is for preventative and naratriptan is for abortive therapy. Due to stroke hx patient has contraindication for triptans. I went ahead and completed a PA for nurtec through her new insurance and awaiting authorization determination.

## 2023-08-02 NOTE — Telephone Encounter (Signed)
 Patient said Rimegepant Sulfate ([name]) 75 MG TBDP is not covered by new insurance. Insurance agency suggested two medications that can replace [name]: Naratrtidptan or Quiqulipta. Pt said prefer the Naratripdtan because that is the cheaper of the two. Would like a call back.

## 2023-08-02 NOTE — Telephone Encounter (Signed)
 noted

## 2023-08-02 NOTE — Telephone Encounter (Signed)
 Patient new insurance information [employer] [id number] Group ID [id number] BIN [id number] PCN: [phone]

## 2023-08-02 NOTE — Telephone Encounter (Signed)
 Contacted pt, informed her Naratriptan is contraindicated due to stroke and Bennie Pierini would be a daily preventative. Informed we are in the process of submitting PA and awaiting determination. She verbally understood and was appreciative.

## 2023-08-08 NOTE — Progress Notes (Unsigned)
 [facility] [address] [place]. [place] [address] [phone]       OFFICE FOLLOW UP VISIT NOTE  Jeanette [name] Date of Birth:  January 29, 1951 Medical Record Number:  [id number]   Referring MD: Virl Son Reason for Referral: Migraine f/u   Chief complaint: No chief complaint on file.      HPI:   Update 08/09/2023 JM: Patient returns for follow-up visit after prior visit 1.5 years ago.  Migraines remain well-controlled on amitriptyline 10 mg nightly.    No new stroke/TIA symptoms.  Remains on aspirin and atorvastatin.  Routinely follows with PCP for stroke risk factor management.     History provided for reference purposes only Update 01/04/2022 JM: Patient returns for follow-up visit after prior visit over 1 year ago unaccompanied  Reports migraines have been stable since prior visit on amitriptyline 10 mg nightly. Reports about 3 migraine days per month. Will use [name] with resolution and denies side effects.   Memory has been stable, continues to maintain ADLs and IADLs independently  Denies new stroke/TIA symptoms.  Compliant on aspirin and atorvastatin.  Blood pressure and diabetes well controlled.  Closely follows with PCP.  Nightly use of CPAP for OSA management.  Update 12/23/2020 JM: Jeanette [name] returns for overdue 9-month follow-up.  Migraines: Reports 2-3 migraines per month on amitriptyline 10 mg nightly tolerating without side effects.  Migraines can last usually all day -located frontal with throbbing sensation - will take tylenol with limited benefit. She has tried Depakote and venlafaxine in the past without benefit.  She has not previously tried emergent relief.  Memory loss: Stable. Maintaining ADLs and IADLs independently  History of stroke: Remains on aspirin and atorvastatin for secondary stroke prevention.  Blood pressure today 144/79.  Recent A1c 6.3.  Reports ongoing use of CPAP for OSA management.   C/o imbalance which  has been progressive the past 3 months - when standing still, feels as if she is getting pulled but not in any specific direction. Can have dizziness associated at times but denies spinning type sensation.  Denies any visual changes or symptoms.  Denies symptoms previously. Does have neck pain which can cause headaches - does follow with ortho Dr. Ethelene Hal for neck and back issues receiving injections. Does have hx of likely diabetic neuropathy in feet and hands. She does plan on starting PT today.    Update 04/16/2020 JM: Jeanette [name] returns for follow-up with prior complaints of memory concerns and headaches.  Initiated amitriptyline 25 mg at prior visit but unable to tolerate therefore decreased to 10 mg nightly.  She has been doing very well without any recent migraine or headaches and tolerating low-dose amitriptyline without difficulty.  Reports improvement of cognitive impairment and maintains ADLs and IADLs independently.  History of stroke and continues on aspirin 81 mg daily and atorvastatin 20 mg daily without side effects.  Blood pressure today 142/76.  Reports ongoing use of CPAP for OSA management followed by pulmonology.  Reports b/l cataract surgery in October with improvement of vision and routinely follows with ophthalmology.  No concerns at this time.  Update 10/09/2019 JM: Jeanette [name] returns for follow-up regarding memory concerns and headaches.  Since prior visit, she was evaluated in the ED on 07/14/2019 for intermittent episodes of right upper extremity numbness, tingling, balance issues and headache.  Headache resolved after migraine cocktail.  MRI unremarkable and was advised to follow-up outpatient.  She denies reoccurring transient episodes of numbness, tingling and balance but  continues to experience daily headaches.  Longstanding history of headache with use of Depakote 500 mg twice daily and at prior visit recommended increasing dosage to 1000 mg twice daily but apparently was only  taking once daily.  She was advised to increase to twice daily after ED admission as well as being started on venlafaxine Dr Terrace Arabia who was on-call provider at that time.  She reports since that time, headaches have been daily with at time 9/10 pain located "all over my head" associated with photophobia, phonophobia but denies nausea or vomiting.  She reports at times headache can be located occipital area with a pressure type band sensation.  She states she attempted to take 1500 mg of Depakote spread out throughout the day but did not notice any benefit therefore currently only taking one 500 mg tablet daily in addition to venlafaxine 37.5 mg daily.  Due to continued headaches, she is questioning change in medical management. She has not been on any other prior preventative medications.  She was recently found to have positive ANA and does have rheumatology evaluation on 7/1.  Blood pressure stable at 118/77 and does monitor at home and typically lower.  History of OSA with ongoing compliance of CPAP currently being followed by pulmonology.  Cognition has been stable without worsening.  No further concerns at this time.  Update 04/10/2019 Dr Pearlean Brownie:  She returns for follow-up after last visit 4 months ago.  She states her subjective memory difficulties appear more or less unchanged.  She denies significant worsening of her symptoms or ability to do activities of daily living for herself.  She mostly struggles with recent information and short-term memory but long-term memory appears to be preserved.  She did undergo lab work for reversible causes of memory loss and 12/07/2018 and vitamin B12, TSH, RPR and homocystine were all normal.  EEG done on 01/15/2019 was normal.  MRI scan of the brain on 01/27/2019 was also normal.  She has not been participating regularly in cognitively challenging activities.  She has not been quite physically active either.  She has heard about provision over-the-counter and would like to try  it.  She continues to have headaches but they are not as severe and occur about 3 or 4 times a week.  She is currently on Depakote ER 500 twice daily and it seems to help her and she is tolerating it well without any side effects.  She has no new complaints today.  Initial visit 12/07/2018 Dr. Pearlean Brownie : Jeanette [name] is a 73 year old pleasant African-American lady who is seen today for evaluation for memory difficulties.  History is obtained from the patient, review of electronic medical records and I personally reviewed imaging films in PACS.  She has a past medical history of breast cancer 2018 status post surgery, chemotherapy and radiation now in remission, chronic headaches, hypertension, hyperlipidemia, hypothyroidism and chronic back pain.  She states for the last 3 months or so she is noticed some memory and cognitive difficulties.  She describes that mostly her short-term memory is poor but occasionally she has even some long-term memory difficulties.  She has trouble remembering names and recent events.  She has learned to write things down.  She has not noticed any trouble in driving and has never gotten lost.  She has been able to manage all her activities of daily living and continues to live alone.  She does admit to her mood being slightly under at times but denies depression.  She is  never been on treatment for depression.  She has chronic headaches and has seen me several years ago and has been taking Depakote ER 500 twice daily and feels her headaches are under control she can only occasional headaches.  She denies any stroke, seizure, significant head injury with loss of consciousness.  There is no family history of dementia or cognitive problems.  She has not had any recent brain scan or lab work for memory loss done.  She has a prior history of abnormal MRI showing transient left medial temporal diffusion hyperintensity on 02/22/2015 of unclear significance which was found to have resolved on  follow-up MRI in 03/30/2015.  She underwent extensive work-up for stroke and seizures which was all negative.     ROS:   14 system review of systems is positive for no complaints and all other systems negative PMH:  Past Medical History:  Diagnosis Date   Anemia    Arthritis    "back" (07/18/2014)   Cancer of left breast (HCC)    Chronic back pain    Headache    Heart murmur    many years ago   Hyperlipidemia    Hypertension    Hypothyroidism    thyroidectomy 07/18/2014   Neuromuscular disorder (HCC)    neuropathy hands and feet   Personal history of chemotherapy    Personal history of radiation therapy    Stroke (HCC)    Type II diabetes mellitus (HCC)     Social History:  Social History   Socioeconomic History   Marital status: Divorced    Spouse name: Not on file   Number of children: 1   Years of education: Not on file   Highest education level: Some college, no degree  Occupational History   Not on file  Tobacco Use   Smoking status: Former    Current packs/day: 0.00    Average packs/day: 0.3 packs/day for 5.0 years (1.3 ttl pk-yrs)    Types: Cigarettes    Start date: 05/24/1965    Quit date: 05/24/1970    Years since quitting: 53.2    Passive exposure: Never   Smokeless tobacco: Never  Vaping Use   Vaping status: Never Used  Substance and Sexual Activity   Alcohol use: No    Comment: 07/18/2014 "quit drinking in the 1980's"   Drug use: Not Currently    Types: Marijuana    Comment: occassionally way back when   Sexual activity: Not Currently  Other Topics Concern   Not on file  Social History Narrative   Lives at home alone   Right handed   Caffeine: 1 cup coffee daily   Social Drivers of Corporate investment banker Strain: Not on file  Food Insecurity: Low Risk  (06/17/2023)   Received from [facility]   Hunger Vital Sign    Worried About Running Out of Food in the Last Year: Never true    Ran Out of Food in the Last Year: Never true   Transportation Needs: No Transportation Needs (06/17/2023)   Received from [facility]    In the past 12 months, has lack of reliable transportation kept you from medical appointments, meetings, work or from getting things needed for daily living? : No  Physical Activity: Not on file  Stress: Not on file  Social Connections: Not on file  Intimate Partner Violence: Not on file    Medications:   Current Outpatient Medications on File Prior to Visit  Medication  Sig Dispense Refill   ACCU-CHEK AVIVA PLUS test strip      albuterol (VENTOLIN HFA) 108 (90 Base) MCG/ACT inhaler Inhale 2 puffs into the lungs every 6 (six) hours as needed for wheezing or shortness of breath. 18 g 11   amitriptyline (ELAVIL) 10 MG tablet TAKE 1 TABLET BY MOUTH EVERYDAY AT BEDTIME 90 tablet 3   amLODipine (NORVASC) 10 MG tablet Take 10 mg by mouth daily.     aspirin EC 81 MG tablet Take 81 mg by mouth daily.     atorvastatin (LIPITOR) 20 MG tablet Take 20 mg by mouth daily.      calcium-vitamin D (OSCAL WITH D) 500-200 MG-UNIT per tablet Take 2 tablets by mouth 2 (two) times daily. 60 tablet 1   furosemide (LASIX) 20 MG tablet Take 20 mg by mouth daily.      gabapentin (NEURONTIN) 300 MG capsule Take 300 mg by mouth 3 (three) times daily.      levothyroxine (SYNTHROID, LEVOTHROID) 88 MCG tablet Take 88 mcg by mouth daily before breakfast.     linaclotide (LINZESS) 290 MCG CAPS capsule Take 1 capsule (290 mcg total) by mouth daily before breakfast. 30 capsule 11   Multiple Vitamins-Minerals (MULTIVITAMIN WITH MINERALS) tablet Take 1 tablet by mouth daily.      olmesartan (BENICAR) 20 MG tablet Take 20 mg by mouth daily.     polyethylene glycol (MIRALAX / GLYCOLAX) 17 g packet Take 17 g by mouth daily.     Rimegepant Sulfate ([name]) 75 MG TBDP Take 1 tablet (75 mg total) by mouth as needed (Take 1 tab as needed at onset of migrane. Do Not exceed more than 1 tablet in 24 hours). 16 tablet 5    Semaglutide,0.25 or 0.5MG /DOS, 2 MG/1.5ML SOPN Inject into the skin.     TYRVAYA 0.03 MG/ACT SOLN SMARTSIG:1 Spray(s) Both Nares     Current Facility-Administered Medications on File Prior to Visit  Medication Dose Route Frequency Provider Last Rate Last Admin   dexamethasone (DECADRON) injection 2 mg  2 mg Intra-articular Once Downs, Max T, DPM        Allergies:   Allergies  Allergen Reactions   Topamax [Topiramate] Other (See Comments)    CAUSED HAIR LOSS    Vitals There were no vitals filed for this visit.  There is no height or weight on file to calculate BMI.    Physical Exam General: well developed, well nourished very pleasant middle-aged African-American lady, seated, in no evident distress Head: head normocephalic and atraumatic.   Neck: supple with no carotid or supraclavicular bruits Cardiovascular: regular rate and rhythm, no murmurs Musculoskeletal: no deformity Skin:  no rash/petichiae Vascular:  Normal pulses all extremities  Neurologic Exam Mental Status: Awake and fully alert.  Fluent speech and language.  Oriented to place and time. Recent and remote memory intact. Attention span, concentration and fund of knowledge appropriate. Mood and affect appropriate.    Cranial Nerves: Pupils equal, briskly reactive to light. Extraocular movements full without nystagmus. Visual fields full to confrontation. Hearing intact. Facial sensation intact. Face, tongue, palate moves normally and symmetrically.  Motor: Normal bulk and tone. Normal strength in all tested extremity muscles. Sensory.: intact to touch , pinprick , position and vibratory sensation.  Coordination: Rapid alternating movements normal in all extremities. Finger-to-nose and heel-to-shin performed accurately bilaterally. Gait and Station: Arises from chair without difficulty. Stance is normal. Gait demonstrates normal stride length and balance without use of assistive device. Able to heel,  toe and tandem walk  with mild difficulty.  Romberg negative. Reflexes: 1+ and symmetric. Toes downgoing.        ASSESSMENT/PLAN: 73 year old African-American lady with PMH of HTN, HLD, DM, OSA on CPAP and prior stroke with subacute memory and mild cognitive difficulties likely due to mild cognitive impairment as well as chronic migraine headaches.  Evaluation in ED on 07/14/2019 with transient left arm numbness/tingling, imbalance and headache with improvement of symptoms after migraine cocktail.  MRI unremarkable.  Chronic headaches likely mix of migraines without aura with some underlying tension related features which have since improved on amitriptyline currently experiencing 3 migraines per month.     -Continue amitriptyline 10 mg nightly for migraine prophylaxis -refill up to date -Continue [name] 75 mg as needed for acute migraine relief - refill provided  -No benefit with use of Depakote or venlafaxine -Discussed avoidance of migraine triggers and managing stressors -Advised to call with any worsening migraine headaches -Continue to follow with PCP for secondary stroke prevention measures including BP goal<130/90, HLD with LDL goal<70 and DM with A1c.<7      Follow-up in 1 year or call earlier if needed    CC:  Verlon Au, MD    I spent 26 minutes of face-to-face and non-face-to-face time with patient.  This included previsit chart review, lab review, study review, order entry, electronic health record documentation, patient education regarding migraine headaches, ongoing use of amitriptyline and use of emergent therapy, cognitive impairment, history of stroke,  and answered all questions to patient satisfaction  Ihor Austin, AGNP-BC  [facility] [address] [place], [place] [address]  Phone [phone] Fax [phone] Note: This document was prepared with digital dictation and possible smart phrase technology. Any transcriptional errors that  result from this process are unintentional.

## 2023-08-09 ENCOUNTER — Ambulatory Visit: Payer: Self-pay | Admitting: Adult Health

## 2023-08-09 ENCOUNTER — Encounter: Payer: Self-pay | Admitting: Adult Health

## 2023-08-09 VITALS — BP 122/68 | HR 70 | Ht 62.0 in | Wt 133.0 lb

## 2023-08-09 DIAGNOSIS — Z87898 Personal history of other specified conditions: Secondary | ICD-10-CM

## 2023-08-09 DIAGNOSIS — G43709 Chronic migraine without aura, not intractable, without status migrainosus: Secondary | ICD-10-CM | POA: Diagnosis not present

## 2023-08-09 DIAGNOSIS — Z8673 Personal history of transient ischemic attack (TIA), and cerebral infarction without residual deficits: Secondary | ICD-10-CM

## 2023-08-09 MED ORDER — NURTEC 75 MG PO TBDP: 75.0000 mg | ORAL_TABLET | ORAL | 11 refills | Status: DC | PRN

## 2023-08-09 MED ORDER — AMITRIPTYLINE HCL 10 MG PO TABS: 10.0000 mg | ORAL_TABLET | Freq: Every day | ORAL | 11 refills | Status: AC

## 2023-08-09 NOTE — Patient Instructions (Signed)
 Restart amitriptyline 10mg  nightly - after 2-3 weeks, if headaches persist, please let me know and we can increase dosage   Continue [name] as needed  Continue to follow with Dr. Leavy Cella for stroke risk factor management     Follow up in 6 months or call earlier if needed

## 2023-08-11 ENCOUNTER — Other Ambulatory Visit (HOSPITAL_COMMUNITY): Payer: Self-pay

## 2023-08-11 ENCOUNTER — Telehealth: Payer: Self-pay

## 2023-08-11 NOTE — Telephone Encounter (Signed)
   I received a request via fax to complete a PA for [name]-however per [facility] submission appears a PA was already submitted and denied-will outreach insurance today to try to obtain denial information.

## 2023-08-19 ENCOUNTER — Other Ambulatory Visit (HOSPITAL_COMMUNITY): Payer: Self-pay

## 2023-08-23 ENCOUNTER — Ambulatory Visit: Payer: 59 | Admitting: Adult Health

## 2023-08-26 ENCOUNTER — Other Ambulatory Visit (HOSPITAL_COMMUNITY): Payer: Self-pay

## 2023-08-26 NOTE — Telephone Encounter (Signed)
 There was a PA previously submitted-unsure by who but it was denied due to [employer]	[place] is not on the formulary, [address] prefers, [place], [place], [place]. I have requested for them to fax me the denial letter.

## 2023-08-30 MED ORDER — UBRELVY 100 MG PO TABS: 100.0000 mg | ORAL_TABLET | ORAL | 11 refills | Status: DC | PRN

## 2023-08-30 NOTE — Telephone Encounter (Signed)
 Please advise patient that insurance declined coverage of [name].  Order placed to start [facility] for migraine rescue.  She can repeat after 2 hours if needed.  Max dose 2 tablets in 24 hours.  If no benefit, can retry Therapist, occupational for [employer]	[place].  Mentions trying Aimovig, Emgality or Qulipta as well but these are preventative medications and [name] and Jeanette [name] is being prescribed for rescue.

## 2023-08-30 NOTE — Telephone Encounter (Signed)
 Spoke w/Pt regarding insurance decline of [name] and prescription sent for [employer] Discussed how to take Ubrelvy. Discussed w/Pt that there are other options and to let us know if the Bernita Raisin is effective or not. Pt stated understanding and was thankful for the call.

## 2023-08-31 NOTE — Telephone Encounter (Signed)
 Pt called and stated that the Ubrogepant (UBRELVY) 100 MG TABS will be costing her $330 and would like something else called in for her. Please advise.

## 2023-09-01 ENCOUNTER — Other Ambulatory Visit (HOSPITAL_COMMUNITY): Payer: Self-pay

## 2023-09-01 ENCOUNTER — Telehealth: Payer: Self-pay

## 2023-09-01 NOTE — Telephone Encounter (Signed)
 Pharmacy Patient Advocate Encounter   Received notification from Physician's Office that prior authorization for Ubrelvy 100MG  tablets is required/requested.   Insurance verification completed.   The patient is insured through [facility] ADVANTAGE/RX ADVANCE .   Per test claim: PA required; PA submitted to above mentioned insurance via CoverMyMeds Key/confirmation #/EOC [facility] Status is pending

## 2023-09-01 NOTE — Telephone Encounter (Signed)
 Pharmacy Patient Advocate Encounter  Received notification from [facility] ADVANTAGE/RX ADVANCE that Prior Authorization for Ubrelvy 100MG  tablets has been APPROVED from 09/01/2023 to 12/01/2023. Unable to obtain price due to refill too soon rejection, last fill date 08/30/2023 next available fill date4/16/2025   PA #/Case ID/Reference #: PA Case ID #: [id number]

## 2023-09-16 NOTE — Telephone Encounter (Signed)
 Pt has called RN back to inform her that the medication would cost her $330.00 she is asking for something much less expensive or even at no cost be found for her.

## 2023-09-16 NOTE — Telephone Encounter (Signed)
 Noted Pt had not read [facility] message. Cld Pt to inform her the Ubrelvy  had been approved and we do not know cost but her pharmacy should be able to tell her. Informed Pt if script has expired to make us  aware and we will send it again. Pt voiced understanding and appreciation for the call.

## 2023-09-23 ENCOUNTER — Telehealth: Payer: Self-pay

## 2023-09-23 NOTE — Telephone Encounter (Signed)
 Spoke w/Pt regarding [employer] asst with Ubrelvy  copay cost. Pt is interested in asst and will be by on Tuesday before noon to sign the form. Informed Pt form will be left at check-in desk for her to sign. Pt voiced thankful for call.

## 2023-10-06 ENCOUNTER — Ambulatory Visit: Payer: Self-pay | Admitting: Gastroenterology

## 2023-10-06 ENCOUNTER — Telehealth: Payer: Self-pay | Admitting: Gastroenterology

## 2023-10-06 ENCOUNTER — Encounter: Payer: Self-pay | Admitting: Gastroenterology

## 2023-10-06 VITALS — BP 122/62 | HR 71 | Ht 62.0 in | Wt 149.0 lb

## 2023-10-06 DIAGNOSIS — R1310 Dysphagia, unspecified: Secondary | ICD-10-CM | POA: Diagnosis not present

## 2023-10-06 NOTE — Telephone Encounter (Signed)
 PT is calling to give an update about the medication they discussed during OV. She stated that is the correction of the medication and it is 0.5mg .

## 2023-10-06 NOTE — Patient Instructions (Signed)
 You have been scheduled for an endoscopy. Please follow written instructions given to you at your visit today.  If you use inhalers (even only as needed), please bring them with you on the day of your procedure.  If you take any of the following medications, they will need to be adjusted prior to your procedure:   DO NOT TAKE 7 DAYS PRIOR TO TEST- Trulicity (dulaglutide) Ozempic, Wegovy (semaglutide) Mounjaro (tirzepatide) Bydureon Bcise (exanatide extended release)  DO NOT TAKE 1 DAY PRIOR TO YOUR TEST Rybelsus (semaglutide) Adlyxin (lixisenatide) Victoza (liraglutide) Byetta (exanatide) ___________________________________________________________________  _______________________________________________________  If your blood pressure at your visit was 140/90 or greater, please contact your primary care physician to follow up on this.  _______________________________________________________  If you are age 6 or older, your body mass index should be between 23-30. Your Body mass index is 27.25 kg/m. If this is out of the aforementioned range listed, please consider follow up with your Primary Care Provider.  If you are age 2 or younger, your body mass index should be between 19-25. Your Body mass index is 27.25 kg/m. If this is out of the aformentioned range listed, please consider follow up with your Primary Care Provider.   ________________________________________________________  The [facility] providers would like to encourage you to use MYCHART to communicate with providers for non-urgent requests or questions.  Due to long hold times on the telephone, sending your provider a message by [facility] may be a faster and more efficient way to get a response.  Please allow 48 business hours for a response.  Please remember that this is for non-urgent requests.  _______________________________________________________

## 2023-10-06 NOTE — Telephone Encounter (Signed)
 Noted.

## 2023-10-06 NOTE — Progress Notes (Signed)
 10/06/2023 Jeanette [name] [id number] May 27, 1950   HISTORY OF PRESENT ILLNESS: This is a 73 year old female who was previously a patient Dr. Emaline Handsome.  She usually follows here for colonoscopies and chronic constipation.  Her care will be assigned to Dr. Brice Campi.  She is here today with complaints of painful swallowing.  She says that she had about 2 months that every time that she would swallow/eat or drink anything she would have pain in the left side of her esophagus.  She says it felt like the food and drink was passing over something in that area and what hurt.  Food never got stuck/no dysphagia.  Once again this occurred for about 2 months, but then resolved and has not occurred for about the past month, but had her concerned.  She denies any heartburn/reflux/indigestion.  She also describes a separate pain but also on the left side.  This occurs randomly, not necessarily with swallowing/eating/drinking.  She says that she will get a pain that shoots up the left side of her neck.  Says that it occurs even when she is just sitting around and lasts about a minute or so.  She has a history of breast cancer also on the left side so she is worried and wants to get checked out from our standpoint make sure there are no esophageal issues.  Past Medical History:  Diagnosis Date   Anemia    Arthritis    "back" (07/18/2014)   Cancer of left breast (HCC)    Chronic back pain    Headache    Heart murmur    many years ago   Hyperlipidemia    Hypertension    Hypothyroidism    thyroidectomy 07/18/2014   Neuromuscular disorder (HCC)    neuropathy hands and feet   Personal history of chemotherapy    Personal history of radiation therapy    Stroke (HCC)    Type II diabetes mellitus (HCC)    Past Surgical History:  Procedure Laterality Date   ABDOMINAL HYSTERECTOMY  1970's   APPENDECTOMY     BILATERAL OOPHORECTOMY  1980j's   BREAST BIOPSY Left 2008   BUNIONECTOMY Right    CARPAL TUNNEL  RELEASE Bilateral    MASTECTOMY Left 2008   REDUCTION MAMMAPLASTY Right    TEE WITHOUT CARDIOVERSION N/A 02/25/2015   Procedure: TRANSESOPHAGEAL ECHOCARDIOGRAM (TEE);  Surgeon: Jacqueline Matsu, MD;  Location: [facility] ENDOSCOPY;  Service: Cardiovascular;  Laterality: N/A;   THYROIDECTOMY N/A 07/18/2014   Procedure: TOTAL THYROIDECTOMY;  Surgeon: Oralee Billow, MD;  Location: [facility] OR;  Service: General;  Laterality: N/A;   TONSILLECTOMY     TOTAL THYROIDECTOMY  07/18/2014    reports that she quit smoking about 53 years ago. Her smoking use included cigarettes. She started smoking about 58 years ago. She has a 1.3 pack-year smoking history. She has never been exposed to tobacco smoke. She has never used smokeless tobacco. She reports that she does not currently use drugs after having used the following drugs: Marijuana. She reports that she does not drink alcohol. family history includes Breast cancer in her maternal aunt; Cancer in her paternal aunt and paternal uncle; Diabetes in her father and sister; Hypertension in her father, sister, and son; Prostate cancer in her father; Stomach cancer in her paternal uncle. Allergies  Allergen Reactions   Topamax  [Topiramate ] Other (See Comments)    CAUSED HAIR LOSS      Outpatient Encounter Medications as of 10/06/2023  Medication Sig  ACCU-CHEK AVIVA PLUS test strip    albuterol  (VENTOLIN  HFA) 108 (90 Base) MCG/ACT inhaler Inhale 2 puffs into the lungs every 6 (six) hours as needed for wheezing or shortness of breath.   amitriptyline  (ELAVIL ) 10 MG tablet Take 1 tablet (10 mg total) by mouth at bedtime.   amLODipine (NORVASC) 10 MG tablet Take 10 mg by mouth daily.   aspirin  EC 81 MG tablet Take 81 mg by mouth daily.   atorvastatin  (LIPITOR) 20 MG tablet Take 20 mg by mouth daily.    calcium -vitamin D  (OSCAL WITH D) 500-200 MG-UNIT per tablet Take 2 tablets by mouth 2 (two) times daily.   furosemide (LASIX) 20 MG tablet Take 20 mg by mouth daily.     levothyroxine  (SYNTHROID , LEVOTHROID) 88 MCG tablet Take 88 mcg by mouth daily before breakfast.   Multiple Vitamins-Minerals (MULTIVITAMIN WITH MINERALS) tablet Take 1 tablet by mouth daily.    olmesartan (BENICAR) 20 MG tablet Take 20 mg by mouth daily.   polyethylene glycol (MIRALAX / GLYCOLAX) 17 g packet Take 17 g by mouth daily.   TYRVAYA 0.03 MG/ACT SOLN SMARTSIG:1 Spray(s) Both Nares   [DISCONTINUED] Semaglutide,0.25 or 0.5MG /DOS, 2 MG/1.5ML SOPN Inject into the skin.   [DISCONTINUED] Ubrogepant  (UBRELVY ) 100 MG TABS Take 1 tablet (100 mg total) by mouth as needed. Can repeat after 2 hours if needed.  Max dose 200 mg per 24-hour   Facility-Administered Encounter Medications as of 10/06/2023  Medication   dexamethasone  (DECADRON ) injection 2 mg    REVIEW OF SYSTEMS  : All other systems reviewed and negative except where noted in the History of Present Illness.   PHYSICAL EXAM: BP 122/62   Pulse 71   Ht 5\' 2"  (1.575 m)   Wt 149 lb (67.6 kg)   BMI 27.25 kg/m  General: Well developed female in no acute distress Head: Normocephalic and atraumatic Eyes:  Sclerae anicteric, conjunctiva pink. Ears: Normal auditory acuity Lungs: Clear throughout to auscultation; no W/R/R.Aaron Aas Heart: Regular rate and rhythm; no M/R/G. Musculoskeletal: Symmetrical with no gross deformities  Skin: No lesions on visible extremities Neurological: Alert oriented x 4, grossly non-focal Psychological:  Alert and cooperative. Normal mood and affect  ASSESSMENT AND PLAN: *Odynophagia: Described 2 months of pain on the left side of her esophagus when she would swallow.  That has now resolved at this point, but she also still gets an intermittent separate type pain where she will get random pains that go up the left side of her neck.  She denies any heartburn or reflux.  She would like to proceed with EGD just to be sure nothing esophageal is going on.  Will schedule with Dr. Brice Campi.  The risks, benefits, and  alternatives to EGD were discussed with the patient and she consents to proceed.   CC:  Jacqulyne Maxim, MD

## 2023-10-08 NOTE — Progress Notes (Signed)
 Attending Physician's Attestation   I have reviewed the chart.   I agree with the Advanced Practitioner's note, impression, and recommendations with any updates as below.    Corliss Parish, MD [facility] Advanced Endoscopy Office # [phone]

## 2023-11-15 ENCOUNTER — Ambulatory Visit: Admitting: Gastroenterology

## 2023-11-15 ENCOUNTER — Encounter: Payer: Self-pay | Admitting: Gastroenterology

## 2023-11-15 VITALS — BP 132/68 | HR 63 | Temp 97.4°F | Resp 13 | Ht 62.0 in | Wt 149.0 lb

## 2023-11-15 DIAGNOSIS — K259 Gastric ulcer, unspecified as acute or chronic, without hemorrhage or perforation: Secondary | ICD-10-CM

## 2023-11-15 DIAGNOSIS — K2970 Gastritis, unspecified, without bleeding: Secondary | ICD-10-CM | POA: Diagnosis not present

## 2023-11-15 DIAGNOSIS — R1310 Dysphagia, unspecified: Secondary | ICD-10-CM | POA: Diagnosis not present

## 2023-11-15 DIAGNOSIS — B9681 Helicobacter pylori [H. pylori] as the cause of diseases classified elsewhere: Secondary | ICD-10-CM

## 2023-11-15 DIAGNOSIS — Q399 Congenital malformation of esophagus, unspecified: Secondary | ICD-10-CM | POA: Diagnosis not present

## 2023-11-15 DIAGNOSIS — K2289 Other specified disease of esophagus: Secondary | ICD-10-CM

## 2023-11-15 MED ORDER — SODIUM CHLORIDE 0.9 % IV SOLN: 500.0000 mL | Freq: Once | INTRAVENOUS | Status: DC

## 2023-11-15 MED ORDER — OMEPRAZOLE 20 MG PO CPDR: 20.0000 mg | DELAYED_RELEASE_CAPSULE | Freq: Two times a day (BID) | ORAL | 3 refills | Status: AC

## 2023-11-15 NOTE — Progress Notes (Signed)
 Report given to PACU, vss

## 2023-11-15 NOTE — Patient Instructions (Addendum)
 - Resume previous diet.                           - Observe patient's clinical course.                           - Recommend omeprazole 20 mg twice daily for 1                            month then 20 mg daily for 1 month then may stop.                           - Continue present medications.                           - Await pathology results.                            YOU HAD AN ENDOSCOPIC PROCEDURE TODAY AT THE [facility]:   Refer to the procedure report that was given to you for any specific questions about what was found during the examination.  If the procedure report does not answer your questions, please call your gastroenterologist to clarify.  If you requested that your care partner not be given the details of your procedure findings, then the procedure report has been included in a sealed envelope for you to review at your convenience later.  YOU SHOULD EXPECT: Some feelings of bloating in the abdomen. Passage of more gas than usual.  Walking can help get rid of the air that was put into your GI tract during the procedure and reduce the bloating. If you had a lower endoscopy (such as a colonoscopy or flexible sigmoidoscopy) you may notice spotting of blood in your stool or on the toilet paper. If you underwent a bowel prep for your procedure, you may not have a normal bowel movement for a few days.  Please Note:  You might notice some irritation and congestion in your nose or some drainage.  This is from the oxygen used during your procedure.  There is no need for concern and it should clear up in a day or so.  SYMPTOMS TO REPORT IMMEDIATELY:   Following upper endoscopy (EGD)  Vomiting of blood or coffee ground material  New chest pain or pain under the shoulder blades  Painful or persistently difficult swallowing  New shortness of breath  Fever of 100F or higher  Black, tarry-looking stools  For urgent or emergent issues, a  gastroenterologist can be reached at any hour by calling [phone]. Do not use MyChart messaging for urgent concerns.    DIET:  We do recommend a small meal at first, but then you may proceed to your regular diet.  Drink plenty of fluids but you should avoid alcoholic beverages for 24 hours.  ACTIVITY:  You should plan to take it easy for the rest of today and you should NOT DRIVE or use heavy machinery until tomorrow (because of the sedation medicines used during the test).    FOLLOW UP: Our staff will call the number listed on your records the next business day following your procedure.  We will call around 7:15- 8:00 am to check on you and address any questions or  concerns that you may have regarding the information given to you following your procedure. If we do not reach you, we will leave a message.     If any biopsies were taken you will be contacted by phone or by letter within the next 1-3 weeks.  Please call us  at [phone] if you have not heard about the biopsies in 3 weeks.    SIGNATURES/CONFIDENTIALITY: You and/or your care partner have signed paperwork which will be entered into your electronic medical record.  These signatures attest to the fact that that the information above on your After Visit Summary has been reviewed and is understood.  Full responsibility of the confidentiality of this discharge information lies with you and/or your care-partner.

## 2023-11-15 NOTE — Progress Notes (Signed)
 1034 Nasopharyngeal airway size 7.0 placed without trauma.  Pt experienced laryngeal spasm with jaw thrust  performed. vss

## 2023-11-15 NOTE — Progress Notes (Signed)
 Called to room to assist during endoscopic procedure.  Patient ID and intended procedure confirmed with present staff. Received instructions for my participation in the procedure from the performing physician.

## 2023-11-15 NOTE — Op Note (Signed)
 [facility] Patient Name: Jeanette [name] Procedure Date: 11/15/2023 10:17 AM MRN: [id number] Endoscopist: Aloha Finner , MD, [id number] Age: 73 Referring MD:  Date of Birth: [birth year] Gender: Female Account #: [id number] Procedure:                Upper GI endoscopy Indications:              Odynophagia Medicines:                Monitored Anesthesia Care Procedure:                Pre-Anesthesia Assessment:                           - Prior to the procedure, a History and Physical                            was performed, and patient medications and                            allergies were reviewed. The patient's tolerance of                            previous anesthesia was also reviewed. The risks                            and benefits of the procedure and the sedation                            options and risks were discussed with the patient.                            All questions were answered, and informed consent                            was obtained. Prior Anticoagulants: The patient has                            taken no anticoagulant or antiplatelet agents                            except for aspirin . ASA Grade Assessment: III - A                            patient with severe systemic disease. After                            reviewing the risks and benefits, the patient was                            deemed in satisfactory condition to undergo the                            procedure.  After obtaining informed consent, the endoscope was                            passed under direct vision. Throughout the                            procedure, the patient's blood pressure, pulse, and                            oxygen saturations were monitored continuously. The                            Olympus Scope [id number] was introduced through the                            mouth, and advanced to the second part of duodenum.                             The upper GI endoscopy was accomplished without                            difficulty. The patient tolerated the procedure. 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                           The proximal esophagus was moderately tortuous.                           No gross mucosal lesions were noted in the entire                            esophagus.                           The Z-line was irregular and was found 40 cm from                            the incisors.                           Scattered mild inflammation characterized by                            erosions and erythema was found at the incisura and                            in the gastric antrum.                           No other gross lesions were noted in the entire  examined stomach. Biopsies were taken with a cold                            forceps for histology and Helicobacter pylori                            testing.                           No gross lesions were noted in the duodenal bulb,                            in the first portion of the duodenum and in the                            second portion of the duodenum. Complications:            No immediate complications. Estimated Blood Loss:     Estimated blood loss was minimal. 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                           - Tortuous proximal esophagus.                           - No gross mucosal lesions in the entire esophagus.                            Z-line irregular, 40 cm from the incisors.                           - Gastritis in the antrum/incisura regions. No                            other gross lesions in the entire stomach. Biopsied.                           - No gross lesions in the  duodenal bulb, in the                            first portion of the duodenum and in the second                            portion of the duodenum. Recommendation:           - The patient will be observed post-procedure,                            until all discharge criteria are met.                           -  Discharge patient to home.                           - Patient has a contact number available for                            emergencies. The signs and symptoms of potential                            delayed complications were discussed with the                            patient. Return to normal activities tomorrow.                            Written discharge instructions were provided to the                            patient.                           - Resume previous diet.                           - Observe patient's clinical course.                           - Recommend omeprazole 20 mg twice daily for 1                            month then 20 mg daily for 1 month then may stop.                           - Continue present medications.                           - Await pathology results.                           - The findings and recommendations were discussed                            with the patient.                           - The findings and recommendations were discussed                            with the designated responsible adult. Aloha Finner, MD 11/15/2023 10:43:52 AM

## 2023-11-15 NOTE — Progress Notes (Signed)
 GASTROENTEROLOGY PROCEDURE H&P NOTE   Primary Care Physician: Jolee Madelin Patch, MD  HPI: Jeanette [name] is a 73 y.o. female who presents for EGD for evaluation of odynophagia.  Past Medical History:  Diagnosis Date   Anemia    Arthritis    back (07/18/2014)   Cancer of left breast (HCC)    Chronic back pain    Headache    Heart murmur    many years ago   Hyperlipidemia    Hypertension    Hypothyroidism    thyroidectomy 07/18/2014   Neuromuscular disorder (HCC)    neuropathy hands and feet   Personal history of chemotherapy    Personal history of radiation therapy    Stroke (HCC)    Type II diabetes mellitus (HCC)    Past Surgical History:  Procedure Laterality Date   ABDOMINAL HYSTERECTOMY  1970's   APPENDECTOMY     BILATERAL OOPHORECTOMY  1980j's   BREAST BIOPSY Left 2008   BUNIONECTOMY Right    CARPAL TUNNEL RELEASE Bilateral    MASTECTOMY Left 2008   REDUCTION MAMMAPLASTY Right    TEE WITHOUT CARDIOVERSION N/A 02/25/2015   Procedure: TRANSESOPHAGEAL ECHOCARDIOGRAM (TEE);  Surgeon: Wilbert JONELLE Bihari, MD;  Location: [facility] ENDOSCOPY;  Service: Cardiovascular;  Laterality: N/A;   THYROIDECTOMY N/A 07/18/2014   Procedure: TOTAL THYROIDECTOMY;  Surgeon: Krystal Spinner, MD;  Location: [facility] OR;  Service: General;  Laterality: N/A;   TONSILLECTOMY     TOTAL THYROIDECTOMY  07/18/2014   Current Outpatient Medications  Medication Sig Dispense Refill   ACCU-CHEK AVIVA PLUS test strip      albuterol  (VENTOLIN  HFA) 108 (90 Base) MCG/ACT inhaler Inhale 2 puffs into the lungs every 6 (six) hours as needed for wheezing or shortness of breath. 18 g 11   amitriptyline  (ELAVIL ) 10 MG tablet Take 1 tablet (10 mg total) by mouth at bedtime. 30 tablet 11   amLODipine (NORVASC) 10 MG tablet Take 10 mg by mouth daily.     aspirin  EC 81 MG tablet Take 81 mg by mouth daily.     atorvastatin  (LIPITOR) 20 MG tablet Take 20 mg by mouth daily.      calcium -vitamin D  (OSCAL WITH D) 500-200  MG-UNIT per tablet Take 2 tablets by mouth 2 (two) times daily. 60 tablet 1   furosemide (LASIX) 20 MG tablet Take 20 mg by mouth daily.      levothyroxine  (SYNTHROID , LEVOTHROID) 88 MCG tablet Take 88 mcg by mouth daily before breakfast.     Multiple Vitamins-Minerals (MULTIVITAMIN WITH MINERALS) tablet Take 1 tablet by mouth daily.      olmesartan (BENICAR) 20 MG tablet Take 20 mg by mouth daily.     polyethylene glycol (MIRALAX / GLYCOLAX) 17 g packet Take 17 g by mouth daily.     repaglinide (PRANDIN) 0.5 MG tablet Take 0.5 mg by mouth 2 (two) times daily before a meal.     TYRVAYA 0.03 MG/ACT SOLN SMARTSIG:1 Spray(s) Both Nares     Current Facility-Administered Medications  Medication Dose Route Frequency Provider Last Rate Last Admin   dexamethasone  (DECADRON ) injection 2 mg  2 mg Intra-articular Once Hyatt, Max T, DPM        Current Outpatient Medications:    ACCU-CHEK AVIVA PLUS test strip, , Disp: , Rfl:    albuterol  (VENTOLIN  HFA) 108 (90 Base) MCG/ACT inhaler, Inhale 2 puffs into the lungs every 6 (six) hours as needed for wheezing or shortness of breath., Disp: 18 g, Rfl: 11  amitriptyline  (ELAVIL ) 10 MG tablet, Take 1 tablet (10 mg total) by mouth at bedtime., Disp: 30 tablet, Rfl: 11   amLODipine (NORVASC) 10 MG tablet, Take 10 mg by mouth daily., Disp: , Rfl:    aspirin  EC 81 MG tablet, Take 81 mg by mouth daily., Disp: , Rfl:    atorvastatin  (LIPITOR) 20 MG tablet, Take 20 mg by mouth daily. , Disp: , Rfl:    calcium -vitamin D  (OSCAL WITH D) 500-200 MG-UNIT per tablet, Take 2 tablets by mouth 2 (two) times daily., Disp: 60 tablet, Rfl: 1   furosemide (LASIX) 20 MG tablet, Take 20 mg by mouth daily. , Disp: , Rfl:    levothyroxine  (SYNTHROID , LEVOTHROID) 88 MCG tablet, Take 88 mcg by mouth daily before breakfast., Disp: , Rfl:    Multiple Vitamins-Minerals (MULTIVITAMIN WITH MINERALS) tablet, Take 1 tablet by mouth daily. , Disp: , Rfl:    olmesartan (BENICAR) 20 MG tablet,  Take 20 mg by mouth daily., Disp: , Rfl:    polyethylene glycol (MIRALAX / GLYCOLAX) 17 g packet, Take 17 g by mouth daily., Disp: , Rfl:    repaglinide (PRANDIN) 0.5 MG tablet, Take 0.5 mg by mouth 2 (two) times daily before a meal., Disp: , Rfl:    TYRVAYA 0.03 MG/ACT SOLN, SMARTSIG:1 Spray(s) Both Nares, Disp: , Rfl:   Current Facility-Administered Medications:    dexamethasone  (DECADRON ) injection 2 mg, 2 mg, Intra-articular, Once, Hyatt, Max T, DPM Allergies  Allergen Reactions   Topamax  [Topiramate ] Other (See Comments)    CAUSED HAIR LOSS   Family History  Problem Relation Age of Onset   Hypertension Father    Diabetes Father    Prostate cancer Father    Hypertension Sister    Diabetes Sister    Breast cancer Maternal Aunt    Cancer Paternal Aunt    Stomach cancer Paternal Uncle    Cancer Paternal Uncle    Hypertension Son    Colon cancer Neg Hx    Social History   Socioeconomic History   Marital status: Divorced    Spouse name: Not on file   Number of children: 1   Years of education: Not on file   Highest education level: Some college, no degree  Occupational History   Not on file  Tobacco Use   Smoking status: Former    Current packs/day: 0.00    Average packs/day: 0.3 packs/day for 5.0 years (1.3 ttl pk-yrs)    Types: Cigarettes    Start date: 05/24/1965    Quit date: 05/24/1970    Years since quitting: 53.5    Passive exposure: Never   Smokeless tobacco: Never  Vaping Use   Vaping status: Never Used  Substance and Sexual Activity   Alcohol use: No    Comment: 07/18/2014 quit drinking in the 1980's   Drug use: Not Currently    Types: Marijuana    Comment: occassionally way back when   Sexual activity: Not Currently  Other Topics Concern   Not on file  Social History Narrative   Lives at home alone   Right handed   Caffeine: 1 cup coffee daily   Social Drivers of Corporate investment banker Strain: Not on file  Food Insecurity: Low Risk   (06/17/2023)   Received from [facility]   Hunger Vital Sign    Within the past 12 months, you worried that your food would run out before you got money to buy more: Never true    Within  the past 12 months, the food you bought just didn't last and you didn't have money to get more. : Never true  Transportation Needs: No Transportation Needs (06/17/2023)   Received from [facility]    In the past 12 months, has lack of reliable transportation kept you from medical appointments, meetings, work or from getting things needed for daily living? : No  Physical Activity: Not on file  Stress: Not on file  Social Connections: Not on file  Intimate Partner Violence: Not on file    Physical Exam: There were no vitals filed for this visit. There is no height or weight on file to calculate BMI. GEN: NAD EYE: Sclerae anicteric ENT: MMM CV: Non-tachycardic GI: Soft, NT/ND NEURO:  Alert & Oriented x 3  Lab Results: No results for input(s): WBC, HGB, HCT, PLT in the last 72 hours. BMET No results for input(s): NA, K, CL, CO2, GLUCOSE, BUN, CREATININE, CALCIUM  in the last 72 hours. LFT No results for input(s): PROT, ALBUMIN, AST, ALT, ALKPHOS, BILITOT, BILIDIR, IBILI in the last 72 hours. PT/INR No results for input(s): LABPROT, INR in the last 72 hours.   Impression / Plan: This is a 73 y.o.female who presents for EGD for evaluation of odynophagia.  The risks and benefits of endoscopic evaluation/treatment were discussed with the patient and/or family; these include but are not limited to the risk of perforation, infection, bleeding, missed lesions, lack of diagnosis, severe illness requiring hospitalization, as well as anesthesia and sedation related illnesses.  The patient's history has been reviewed, patient examined, no change in status, and deemed stable for procedure.  The patient and/or family is agreeable to proceed.     Aloha Finner, MD [facility] Advanced Endoscopy Office # [phone]

## 2023-11-15 NOTE — Progress Notes (Signed)
1026 Robinul 0.1 mg IV given due large amount of secretions upon assessment.  MD made aware, vss  °

## 2023-11-16 ENCOUNTER — Telehealth: Payer: Self-pay

## 2023-11-16 NOTE — Telephone Encounter (Signed)
  Follow up Call-     11/15/2023    9:50 AM  Call back number  Post procedure Call Back phone  # [phone]  Permission to leave phone message Yes     Patient questions:  Do you have a fever, pain , or abdominal swelling? No. Pain Score  0 *  Have you tolerated food without any problems? Yes.    Have you been able to return to your normal activities? Yes.    Do you have any questions about your discharge instructions: Diet   No. Medications  No. Follow up visit  No.  Do you have questions or concerns about your Care? No.  Actions: * If pain score is 4 or above: No action needed, pain <4.

## 2023-11-17 LAB — SURGICAL PATHOLOGY

## 2023-11-18 ENCOUNTER — Ambulatory Visit: Payer: Self-pay | Admitting: Gastroenterology

## 2023-11-21 ENCOUNTER — Other Ambulatory Visit: Payer: Self-pay

## 2023-11-21 MED ORDER — METRONIDAZOLE 250 MG PO TABS
500.0000 mg | ORAL_TABLET | Freq: Two times a day (BID) | ORAL | 0 refills | Status: AC
Start: 2023-11-21 — End: 2023-12-05

## 2023-11-21 MED ORDER — BISMUTH SUBSALICYLATE 262 MG PO TABS: 2.0000 | ORAL_TABLET | Freq: Four times a day (QID) | ORAL | 0 refills | Status: AC

## 2023-11-21 MED ORDER — AMOXICILLIN 500 MG PO TABS: 1000.0000 mg | ORAL_TABLET | Freq: Two times a day (BID) | ORAL | 0 refills | Status: AC

## 2023-11-24 ENCOUNTER — Telehealth: Payer: Self-pay | Admitting: Gastroenterology

## 2023-11-24 NOTE — Telephone Encounter (Signed)
 The pt has been advised that the bismuth is the cause of her black stools.  She will call back if there are any other complaints or concerns

## 2023-11-24 NOTE — Telephone Encounter (Signed)
 Patient called and stated that she has a EGD done a week ago and today she noticed her bowel movement was black. Patient stated that she is not sure if it's due to the medication she was given or not. Patient is requesting a call back. Please advise.

## 2023-12-01 NOTE — Telephone Encounter (Signed)
 Inbound call from patient, stating that she is now having incontinence and would like to know how long her symptoms will last for.

## 2023-12-01 NOTE — Telephone Encounter (Signed)
 The pt wanted to confirm that the black stools will resolve after she finishes her antibiotics for H pylori. She has been advised that her stools will return to normal color when treatment is complete.  The pt has been advised of the information and verbalized understanding.

## 2024-02-13 ENCOUNTER — Emergency Department (HOSPITAL_COMMUNITY)

## 2024-02-13 ENCOUNTER — Other Ambulatory Visit: Payer: Self-pay

## 2024-02-13 ENCOUNTER — Emergency Department (HOSPITAL_COMMUNITY)
Admission: EM | Admit: 2024-02-13 | Discharge: 2024-02-13 | Discharge status: Against Medical Advice | Attending: Emergency Medicine | Admitting: Emergency Medicine

## 2024-02-13 ENCOUNTER — Encounter (HOSPITAL_COMMUNITY): Payer: Self-pay

## 2024-02-13 DIAGNOSIS — M7989 Other specified soft tissue disorders: Secondary | ICD-10-CM | POA: Diagnosis not present

## 2024-02-13 DIAGNOSIS — M79671 Pain in right foot: Secondary | ICD-10-CM | POA: Diagnosis not present

## 2024-02-13 DIAGNOSIS — R0789 Other chest pain: Secondary | ICD-10-CM | POA: Insufficient documentation

## 2024-02-13 DIAGNOSIS — Y92410 Unspecified street and highway as the place of occurrence of the external cause: Secondary | ICD-10-CM | POA: Diagnosis not present

## 2024-02-13 DIAGNOSIS — M25562 Pain in left knee: Secondary | ICD-10-CM | POA: Insufficient documentation

## 2024-02-13 DIAGNOSIS — M79642 Pain in left hand: Secondary | ICD-10-CM | POA: Diagnosis present

## 2024-02-13 DIAGNOSIS — Z5321 Procedure and treatment not carried out due to patient leaving prior to being seen by health care provider: Secondary | ICD-10-CM | POA: Diagnosis not present
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
## 2024-02-13 NOTE — ED Notes (Signed)
 Reg stated they have been calling this pt since 7 with no answer
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
## 2024-02-13 NOTE — ED Notes (Signed)
 Pt called 3x witrh no answer.  Per Dr Simon pt was called to advise pt has fracture in right 5th metatarsal

## 2024-02-14 ENCOUNTER — Emergency Department (HOSPITAL_COMMUNITY)
Admission: EM | Admit: 2024-02-14 | Discharge: 2024-02-14 | Discharge status: Against Medical Advice | Attending: Emergency Medicine | Admitting: Emergency Medicine

## 2024-02-14 ENCOUNTER — Encounter (HOSPITAL_COMMUNITY): Payer: Self-pay | Admitting: Emergency Medicine

## 2024-02-14 ENCOUNTER — Other Ambulatory Visit: Payer: Self-pay

## 2024-02-14 ENCOUNTER — Ambulatory Visit: Admitting: Gastroenterology

## 2024-02-14 DIAGNOSIS — Y92410 Unspecified street and highway as the place of occurrence of the external cause: Secondary | ICD-10-CM | POA: Diagnosis not present

## 2024-02-14 DIAGNOSIS — M79671 Pain in right foot: Secondary | ICD-10-CM | POA: Insufficient documentation

## 2024-02-14 DIAGNOSIS — Z5321 Procedure and treatment not carried out due to patient leaving prior to being seen by health care provider: Secondary | ICD-10-CM | POA: Diagnosis not present

## 2024-02-14 NOTE — ED Notes (Signed)
 Called for room and unable to locate in lobby.
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
## 2024-02-15 ENCOUNTER — Telehealth: Payer: Self-pay | Admitting: Adult Health
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
## 2024-02-15 NOTE — Telephone Encounter (Signed)
 PT called to cancel appt due to not feeling well  Appt Cancel

## 2024-02-16 ENCOUNTER — Ambulatory Visit: Admitting: Adult Health

## 2024-06-05 ENCOUNTER — Other Ambulatory Visit: Payer: Self-pay | Admitting: Family Medicine

## 2024-06-05 DIAGNOSIS — Z1231 Encounter for screening mammogram for malignant neoplasm of breast: Secondary | ICD-10-CM

## 2024-06-21 ENCOUNTER — Encounter (HOSPITAL_COMMUNITY): Payer: Self-pay | Admitting: Emergency Medicine

## 2024-06-21 ENCOUNTER — Emergency Department (HOSPITAL_COMMUNITY)
Admission: EM | Admit: 2024-06-21 | Discharge: 2024-06-22 | Disposition: A | Discharge status: Home / Self Care | Payer: Medicare (Managed Care) | Attending: Emergency Medicine | Admitting: Emergency Medicine

## 2024-06-21 ENCOUNTER — Other Ambulatory Visit: Payer: Self-pay

## 2024-06-21 ENCOUNTER — Emergency Department (HOSPITAL_COMMUNITY): Payer: Medicare (Managed Care)

## 2024-06-21 DIAGNOSIS — R519 Headache, unspecified: Secondary | ICD-10-CM | POA: Diagnosis not present

## 2024-06-21 DIAGNOSIS — N183 Chronic kidney disease, stage 3 unspecified: Secondary | ICD-10-CM | POA: Diagnosis not present

## 2024-06-21 DIAGNOSIS — E1122 Type 2 diabetes mellitus with diabetic chronic kidney disease: Secondary | ICD-10-CM | POA: Diagnosis not present

## 2024-06-21 DIAGNOSIS — I130 Hypertensive heart and chronic kidney disease with heart failure and stage 1 through stage 4 chronic kidney disease, or unspecified chronic kidney disease: Secondary | ICD-10-CM | POA: Diagnosis not present

## 2024-06-21 DIAGNOSIS — Z79899 Other long term (current) drug therapy: Secondary | ICD-10-CM | POA: Diagnosis not present

## 2024-06-21 DIAGNOSIS — R040 Epistaxis: Secondary | ICD-10-CM | POA: Insufficient documentation

## 2024-06-21 DIAGNOSIS — I5032 Chronic diastolic (congestive) heart failure: Secondary | ICD-10-CM | POA: Diagnosis not present

## 2024-06-21 DIAGNOSIS — Z7982 Long term (current) use of aspirin: Secondary | ICD-10-CM | POA: Insufficient documentation

## 2024-06-21 DIAGNOSIS — I10 Essential (primary) hypertension: Secondary | ICD-10-CM

## 2024-06-21 LAB — CBC
HCT: 42.7 % (ref 36.0–46.0)
Hemoglobin: 13.5 g/dL (ref 12.0–15.0)
MCH: 27.4 pg (ref 26.0–34.0)
MCHC: 31.6 g/dL (ref 30.0–36.0)
MCV: 86.6 fL (ref 80.0–100.0)
Platelets: 324 10*3/uL (ref 150–400)
RBC: 4.93 MIL/uL (ref 3.87–5.11)
RDW: 14.4 % (ref 11.5–15.5)
WBC: 6.5 10*3/uL (ref 4.0–10.5)
nRBC: 0 % (ref 0.0–0.2)

## 2024-06-21 LAB — BASIC METABOLIC PANEL WITH GFR
Anion gap: 7 (ref 5–15)
BUN: 10 mg/dL (ref 8–23)
CO2: 29 mmol/L (ref 22–32)
Calcium: 9.7 mg/dL (ref 8.9–10.3)
Chloride: 105 mmol/L (ref 98–111)
Creatinine, Ser: 1.29 mg/dL — ABNORMAL HIGH (ref 0.44–1.00)
GFR, Estimated: 44 mL/min — ABNORMAL LOW
Glucose, Bld: 119 mg/dL — ABNORMAL HIGH (ref 70–99)
Potassium: 4.1 mmol/L (ref 3.5–5.1)
Sodium: 141 mmol/L (ref 135–145)

## 2024-06-21 MED ORDER — AMLODIPINE BESYLATE 5 MG PO TABS
10.0000 mg | ORAL_TABLET | Freq: Once | ORAL | Status: AC
  Administered 2024-06-21: 10 mg via ORAL
  Filled 2024-06-21: qty 2

## 2024-06-21 MED ORDER — OXYMETAZOLINE HCL 0.05 % NA SOLN
1.0000 | Freq: Once | NASAL | Status: AC
  Administered 2024-06-21: 1 via NASAL
  Filled 2024-06-21: qty 30

## 2024-06-21 NOTE — ED Triage Notes (Signed)
 Pt c/o epistaxis x 1 hour pta. Hypertensive in triage.

## 2024-06-21 NOTE — ED Provider Notes (Addendum)
 " [place] [facility] DE[facility]MENT AT [facility] Provider Note   CSN: [id number] Arrival date & time: 06/21/24  2053     Patient presents with: Epistaxis   Jeanette [name] is a 74 y.o. female with history of diabetes, hypertension, thyroid  nodules, CVA, CKD stage III, chronic diastolic CHF, migraines, persistent headaches.  Presents to ED complaining of nosebleed, headache.  Patient is hypertensive on arrival at 207/98.  Patient reports that she has had a headache since today as well as nosebleed that began earlier this evening.  She denies history of nosebleeds.  Denies any blood thinners.  Reports her headache also began earlier today.  She denies history of headaches but has persistent headaches in chart.  She reports that she has not taken her blood pressure medication for the last week or so because she was trying to kill myself naturally.  She denies chest pain, shortness of breath, blurred vision.  She does endorse a headache.  She denies neck pain, numbness or weakness.  Denies any abdominal pain nausea or vomiting.  Denies lightheadedness, dizziness or weakness.   Epistaxis Associated symptoms: headaches        Prior to Admission medications  Medication Sig Start Date End Date Taking? Authorizing Provider  ACCU-CHEK AVIVA PLUS test strip  02/27/15   [provider]  albuterol  (VENTOLIN  HFA) 108 (90 Base) MCG/ACT inhaler Inhale 2 puffs into the lungs every 6 (six) hours as needed for wheezing or shortness of breath. 04/02/19   Jude Harden GAILS, MD  amitriptyline  (ELAVIL ) 10 MG tablet Take 1 tablet (10 mg total) by mouth at bedtime. 08/09/23   Whitfield Raisin, NP  amLODipine  (NORVASC ) 10 MG tablet Take 10 mg by mouth daily. 06/15/19   [provider]  amoxicillin  (AMOXIL ) 500 MG tablet Take 2 tablets (1,000 mg total) by mouth 2 (two) times daily. 11/21/23   Mansouraty, Aloha Raddle., MD  aspirin  EC 81 MG tablet Take 81 mg by mouth daily.    [provider]  atorvastatin  (LIPITOR) 20 MG tablet Take 20 mg by mouth daily.  02/16/11   [provider]  calcium -vitamin D  (OSCAL WITH D) 500-200 MG-UNIT per tablet Take 2 tablets by mouth 2 (two) times daily. 07/19/14   Eletha Boas, MD  furosemide (LASIX) 20 MG tablet Take 20 mg by mouth daily.  07/10/18   [provider]  levothyroxine  (SYNTHROID , LEVOTHROID) 88 MCG tablet Take 88 mcg by mouth daily before breakfast.    [provider]  Multiple Vitamins-Minerals (MULTIVITAMIN WITH MINERALS) tablet Take 1 tablet by mouth daily.  02/16/11   [provider]  olmesartan (BENICAR) 20 MG tablet Take 20 mg by mouth daily. 07/05/19   [provider]  omeprazole  (PRILOSEC) 20 MG capsule Take 1 capsule (20 mg total) by mouth in the morning and at bedtime. 11/15/23   Mansouraty, Aloha Raddle., MD  polyethylene glycol (MIRALAX / GLYCOLAX) 17 g packet Take 17 g by mouth daily.    [provider]  repaglinide (PRANDIN) 0.5 MG tablet Take 0.5 mg by mouth 2 (two) times daily before a meal.    [provider]  TYRVAYA 0.03 MG/ACT SOLN SMARTSIG:1 Spray(s) Both Nares 01/04/22   [provider]    Allergies: Topamax  [topiramate ]    Review of Systems  HENT:  Positive for nosebleeds.   Neurological:  Positive for headaches.    Updated Vital Signs BP (!) 199/97   Pulse 81   Temp 97.7 F (36.5 C) (  Axillary)   Resp 18   Ht 5' 1 (1.549 m)   Wt 72.6 kg   SpO2 96%   BMI 30.23 kg/m   Physical Exam Vitals and nursing note reviewed.  Constitutional:      General: She is not in acute distress.    Appearance: She is well-developed.  HENT:     Head: Normocephalic and atraumatic.     Nose:     Comments: Dried blood to bilateral nares.  No active hemorrhaging. Eyes:     Conjunctiva/sclera: Conjunctivae normal.  Cardiovascular:     Rate and Rhythm: Normal rate and regular rhythm.     Heart sounds: No murmur heard. Pulmonary:     Effort:  Pulmonary effort is normal. No respiratory distress.     Breath sounds: Normal breath sounds.  Abdominal:     Palpations: Abdomen is soft.     Tenderness: There is no abdominal tenderness.  Musculoskeletal:        General: No swelling.     Cervical back: Neck supple.  Skin:    General: Skin is warm and dry.     Capillary Refill: Capillary refill takes less than 2 seconds.  Neurological:     Mental Status: She is alert and oriented to person, place, and time. Mental status is at baseline.     Comments: Reassuring neurological examination without focal neurodeficits noted  Psychiatric:        Mood and Affect: Mood normal.     (all labs ordered are listed, but only abnormal results are displayed) Labs Reviewed  BASIC METABOLIC PANEL WITH GFR - Abnormal; Notable for the following components:      Result Value   Glucose, Bld 119 (*)    Creatinine, Ser 1.29 (*)    GFR, Estimated 44 (*)    All other components within normal limits  CBC    EKG: None  Radiology: CT Head Wo Contrast Result Date: 06/21/2024 EXAM: CT HEAD WITHOUT CONTRAST 06/21/2024 10:52:22 PM TECHNIQUE: CT of the head was performed without the administration of intravenous contrast. Automated exposure control, iterative reconstruction, and/or weight based adjustment of the mA/kV was utilized to reduce the radiation dose to as low as reasonably achievable. COMPARISON: None available. CLINICAL HISTORY: Hypertensive headaches. FINDINGS: BRAIN AND VENTRICLES: No acute hemorrhage. No evidence of acute infarct. No hydrocephalus. No extra-axial collection. No mass effect or midline shift. ORBITS: Bilateral lens replacement. SINUSES: Frothy secretions in left sphenoid sinus. SOFT TISSUES AND SKULL: No acute soft tissue abnormality. No skull fracture. IMPRESSION: 1. No acute intracranial abnormality. Electronically signed by: Morgane Naveau MD 06/21/2024 10:56 PM EST RP Workstation: [id number]    Procedures   Medications Ordered  in the ED  amLODipine  (NORVASC ) tablet 10 mg (10 mg Oral Given 06/21/24 2301)  oxymetazoline  (AFRIN) 0.05 % nasal spray 1 spray (1 spray Each Nare Given 06/21/24 2302)    Medical Decision Making Amount and/or Complexity of Data Reviewed Labs: ordered. Radiology: ordered.  Risk OTC drugs. Prescription drug management.   74 year old female presenting to the ED due to concerns of epistaxis and high blood pressure.  On exam, she is hypertensive to 207/98.  She is endorsing a headache.  Afebrile and nontachycardic.  Lung sounds are clear bilaterally, no hypoxia.  Abdomen is soft and compressible.  Neurological examination is entirely at baseline without focal neurodeficits.  Will draw labs to ensure no elevated creatinine, no leukocytosis, electrolyte derangement and to assess her hemoglobin.  Patient will also receive CT scan of  head due to headache in the setting of high blood pressure.  Patient given home blood pressure medication, 10 mg amlodipine .  Patient given Afrin to right nare where majority of bleeding is coming from.  Patient lab work shows no leukocytosis or anemia.  Her metabolic panel is grossly unremarkable with a send creatinine, no lyte derangement.  CT scan of head unremarkable.  Patient blood pressure is reduced to 170 systolic at this time and she states her headache is also reduced.  No active hemorrhaging to nose at this time. Patient discussed with Dr. Jerral who voices agreement with plan of management.  Patient counseled that uncontrolled high blood pressure can worsen chronic kidney disease.  Patient also counseled on other effects of high blood pressure and she was advised to initiate blood pressure medication once more.  She was advised to begin measuring blood pressure at home and take this measurements to her PCP for evaluation and possible titration of medication.  She was advised to return to the ED with any new or worsening symptoms such as chest pain,  shortness of breath or blurred vision.  She was discharged in stable condition.    Final diagnoses:  Epistaxis  Hypertension, unspecified type    ED Discharge Orders     None          Ruthell Lonni FALCON, PA-C 06/22/24 0032    Ruthell Lonni FALCON, PA-C 06/22/24 PARALEE    Jerral Meth, MD 06/22/24 [phone]  "

## 2024-06-21 NOTE — ED Provider Notes (Incomplete)
 " [place] EMERGENCY DEPARTMENT AT [facility] Provider Note   CSN: [id number] Arrival date & time: 06/21/24  2053     Patient presents with: Epistaxis   Jeanette [name] is a 74 y.o. female with history of diabetes, hypertension, thyroid  nodules, CVA, CKD stage III, chronic diastolic CHF, migraines, persistent headaches.  Presents to ED complaining of nosebleed, headache.  Patient is hypertensive on arrival at 207/98.  Patient reports that she has had a headache since today as well as nosebleed that began earlier this evening.  She denies history of nosebleeds.  Denies any blood thinners.  Reports her headache also began earlier today.  She denies history of headaches but has persistent headaches in chart.  She reports that she has not taken her blood pressure medication for the last week or so because she was trying to kill myself naturally.  She denies chest pain, shortness of breath, blurred vision.  She does endorse a headache.  She denies neck pain, numbness or weakness.  Denies any abdominal pain nausea or vomiting.  Denies lightheadedness, dizziness or weakness.   Epistaxis      Prior to Admission medications  Medication Sig Start Date End Date Taking? Authorizing Provider  ACCU-CHEK AVIVA PLUS test strip  02/27/15   [provider]  albuterol  (VENTOLIN  HFA) 108 (90 Base) MCG/ACT inhaler Inhale 2 puffs into the lungs every 6 (six) hours as needed for wheezing or shortness of breath. 04/02/19   Jude Harden GAILS, MD  amitriptyline  (ELAVIL ) 10 MG tablet Take 1 tablet (10 mg total) by mouth at bedtime. 08/09/23   Whitfield Raisin, NP  amLODipine  (NORVASC ) 10 MG tablet Take 10 mg by mouth daily. 06/15/19   [provider]  amoxicillin  (AMOXIL ) 500 MG tablet Take 2 tablets (1,000 mg total) by mouth 2 (two) times daily. 11/21/23   Mansouraty, Aloha Raddle., MD  aspirin  EC 81 MG tablet Take 81 mg by mouth daily.    [provider]  atorvastatin  (LIPITOR) 20  MG tablet Take 20 mg by mouth daily.  02/16/11   [provider]  calcium -vitamin D  (OSCAL WITH D) 500-200 MG-UNIT per tablet Take 2 tablets by mouth 2 (two) times daily. 07/19/14   Eletha Boas, MD  furosemide (LASIX) 20 MG tablet Take 20 mg by mouth daily.  07/10/18   [provider]  levothyroxine  (SYNTHROID , LEVOTHROID) 88 MCG tablet Take 88 mcg by mouth daily before breakfast.    [provider]  Multiple Vitamins-Minerals (MULTIVITAMIN WITH MINERALS) tablet Take 1 tablet by mouth daily.  02/16/11   [provider]  olmesartan (BENICAR) 20 MG tablet Take 20 mg by mouth daily. 07/05/19   [provider]  omeprazole  (PRILOSEC) 20 MG capsule Take 1 capsule (20 mg total) by mouth in the morning and at bedtime. 11/15/23   Mansouraty, Aloha Raddle., MD  polyethylene glycol (MIRALAX / GLYCOLAX) 17 g packet Take 17 g by mouth daily.    [provider]  repaglinide (PRANDIN) 0.5 MG tablet Take 0.5 mg by mouth 2 (two) times daily before a meal.    [provider]  TYRVAYA 0.03 MG/ACT SOLN SMARTSIG:1 Spray(s) Both Nares 01/04/22   [provider]    Allergies: Topamax  [topiramate ]    Review of Systems  HENT:  Positive for nosebleeds.     Updated Vital Signs BP (!) 207/98 (BP Location: Right Arm)   Pulse 81   Temp 97.7 F (36.5 C) (Axillary)   Resp 18  Ht 5' 1 (1.549 m)   Wt 72.6 kg   SpO2 96%   BMI 30.23 kg/m   Physical Exam  (all labs ordered are listed, but only abnormal results are displayed) Labs Reviewed  CBC  BASIC METABOLIC PANEL WITH GFR    EKG: None  Radiology: No results found.  {Document cardiac monitor, telemetry assessment procedure when appropriate:32947} Procedures   Medications Ordered in the ED  amLODipine  (NORVASC ) tablet 10 mg (has no administration in time range)  oxymetazoline  (AFRIN) 0.05 % nasal spray 1 spray (has no administration in time range)      {Click here for ABCD2, HEART and  other calculators REFRESH Note before signing:1}                              Medical Decision Making Amount and/or Complexity of Data Reviewed Labs: ordered. Radiology: ordered.  Risk OTC drugs. Prescription drug management.   ***  {Document critical care time when appropriate  Document review of labs and clinical decision tools ie CHADS2VASC2, etc  Document your independent review of radiology images and any outside records  Document your discussion with family members, caretakers and with consultants  Document social determinants of health affecting pt's care  Document your decision making why or why not admission, treatments were needed:32947:::1}   Final diagnoses:  None    ED Discharge Orders     None        "

## 2024-06-22 NOTE — Discharge Instructions (Addendum)
 It was a pleasure taking part in your care.  As we discussed, please begin taking your blood pressure medication once more.  Please continue to measure blood pressure at home.  If your blood pressure continues to be over 175 on the top number please return to the ED for further care.  If you have high blood pressure in the setting of headache, chest pain, shortness of breath or blurred vision please return to ED.  Please begin measuring blood pressure at home with home blood pressure machine and write these values down.  Bring these to your PCP for evaluation.

## 2024-07-10 ENCOUNTER — Ambulatory Visit: Payer: Medicare (Managed Care)
